# Patient Record
Sex: Female | Born: 1980 | Race: Black or African American | Hispanic: No | Marital: Single | Smoking: Never smoker
Health system: Southern US, Community
[De-identification: ages and names within clinical notes are randomized; demographics above are authoritative.]

## PROBLEM LIST (undated history)

## (undated) DIAGNOSIS — J309 Allergic rhinitis, unspecified: Secondary | ICD-10-CM

## (undated) DIAGNOSIS — I10 Essential (primary) hypertension: Secondary | ICD-10-CM

## (undated) DIAGNOSIS — J4520 Mild intermittent asthma, uncomplicated: Secondary | ICD-10-CM

## (undated) DIAGNOSIS — E785 Hyperlipidemia, unspecified: Secondary | ICD-10-CM

## (undated) DIAGNOSIS — E119 Type 2 diabetes mellitus without complications: Secondary | ICD-10-CM

## (undated) DIAGNOSIS — G4733 Obstructive sleep apnea (adult) (pediatric): Secondary | ICD-10-CM

## (undated) DIAGNOSIS — E559 Vitamin D deficiency, unspecified: Secondary | ICD-10-CM

## (undated) DIAGNOSIS — Z9989 Dependence on other enabling machines and devices: Secondary | ICD-10-CM

## (undated) DIAGNOSIS — M1990 Unspecified osteoarthritis, unspecified site: Secondary | ICD-10-CM

## (undated) DIAGNOSIS — D509 Iron deficiency anemia, unspecified: Secondary | ICD-10-CM

## (undated) HISTORY — DX: Vitamin D deficiency, unspecified: E55.9

## (undated) HISTORY — DX: Morbid (severe) obesity due to excess calories: E66.01

## (undated) HISTORY — DX: Iron deficiency anemia, unspecified: D50.9

## (undated) HISTORY — DX: Mild intermittent asthma, uncomplicated: J45.20

## (undated) HISTORY — DX: Allergic rhinitis, unspecified: J30.9

## (undated) HISTORY — DX: Hyperlipidemia, unspecified: E78.5

## (undated) HISTORY — PX: INTRAUTERINE DEVICE INSERTION: SHX323

---

## 2002-05-31 ENCOUNTER — Ambulatory Visit (HOSPITAL_COMMUNITY): Admission: RE | Admit: 2002-05-31 | Discharge: 2002-05-31 | Payer: Self-pay | Admitting: *Deleted

## 2002-07-10 ENCOUNTER — Ambulatory Visit (HOSPITAL_COMMUNITY): Admission: RE | Admit: 2002-07-10 | Discharge: 2002-07-10 | Payer: Self-pay | Admitting: *Deleted

## 2002-07-12 ENCOUNTER — Encounter: Admission: RE | Admit: 2002-07-12 | Discharge: 2002-07-12 | Payer: Self-pay | Admitting: *Deleted

## 2002-07-18 ENCOUNTER — Encounter: Admission: RE | Admit: 2002-07-18 | Discharge: 2002-07-18 | Payer: Self-pay | Admitting: *Deleted

## 2002-08-01 ENCOUNTER — Encounter: Admission: RE | Admit: 2002-08-01 | Discharge: 2002-08-01 | Payer: Self-pay | Admitting: *Deleted

## 2002-08-03 ENCOUNTER — Inpatient Hospital Stay (HOSPITAL_COMMUNITY): Admission: RE | Admit: 2002-08-03 | Discharge: 2002-08-03 | Payer: Self-pay | Admitting: *Deleted

## 2002-08-08 ENCOUNTER — Encounter: Admission: RE | Admit: 2002-08-08 | Discharge: 2002-08-08 | Payer: Self-pay | Admitting: *Deleted

## 2002-08-15 ENCOUNTER — Encounter: Admission: RE | Admit: 2002-08-15 | Discharge: 2002-08-15 | Payer: Self-pay | Admitting: *Deleted

## 2002-08-22 ENCOUNTER — Encounter: Admission: RE | Admit: 2002-08-22 | Discharge: 2002-08-22 | Payer: Self-pay | Admitting: *Deleted

## 2002-08-22 ENCOUNTER — Encounter (HOSPITAL_COMMUNITY): Admission: RE | Admit: 2002-08-22 | Discharge: 2002-09-07 | Payer: Self-pay | Admitting: *Deleted

## 2002-08-29 ENCOUNTER — Encounter: Admission: RE | Admit: 2002-08-29 | Discharge: 2002-08-29 | Payer: Self-pay | Admitting: *Deleted

## 2002-09-05 ENCOUNTER — Encounter: Admission: RE | Admit: 2002-09-05 | Discharge: 2002-09-05 | Payer: Self-pay | Admitting: *Deleted

## 2002-09-07 ENCOUNTER — Inpatient Hospital Stay (HOSPITAL_COMMUNITY): Admission: AD | Admit: 2002-09-07 | Discharge: 2002-09-11 | Payer: Self-pay | Admitting: *Deleted

## 2002-09-08 ENCOUNTER — Encounter (INDEPENDENT_AMBULATORY_CARE_PROVIDER_SITE_OTHER): Payer: Self-pay

## 2002-09-14 ENCOUNTER — Inpatient Hospital Stay (HOSPITAL_COMMUNITY): Admission: AD | Admit: 2002-09-14 | Discharge: 2002-09-14 | Payer: Self-pay | Admitting: Obstetrics and Gynecology

## 2002-09-16 ENCOUNTER — Inpatient Hospital Stay (HOSPITAL_COMMUNITY): Admission: AD | Admit: 2002-09-16 | Discharge: 2002-09-16 | Payer: Self-pay | Admitting: Obstetrics and Gynecology

## 2002-09-18 ENCOUNTER — Inpatient Hospital Stay (HOSPITAL_COMMUNITY): Admission: AD | Admit: 2002-09-18 | Discharge: 2002-09-18 | Payer: Self-pay | Admitting: Obstetrics and Gynecology

## 2002-09-21 ENCOUNTER — Inpatient Hospital Stay (HOSPITAL_COMMUNITY): Admission: AD | Admit: 2002-09-21 | Discharge: 2002-09-21 | Payer: Self-pay | Admitting: Obstetrics and Gynecology

## 2002-09-24 ENCOUNTER — Inpatient Hospital Stay (HOSPITAL_COMMUNITY): Admission: AD | Admit: 2002-09-24 | Discharge: 2002-09-24 | Payer: Self-pay | Admitting: Obstetrics and Gynecology

## 2002-10-01 ENCOUNTER — Inpatient Hospital Stay (HOSPITAL_COMMUNITY): Admission: AD | Admit: 2002-10-01 | Discharge: 2002-10-01 | Payer: Self-pay | Admitting: Family Medicine

## 2002-10-08 ENCOUNTER — Inpatient Hospital Stay (HOSPITAL_COMMUNITY): Admission: AD | Admit: 2002-10-08 | Discharge: 2002-10-08 | Payer: Self-pay | Admitting: Obstetrics and Gynecology

## 2002-10-15 ENCOUNTER — Inpatient Hospital Stay (HOSPITAL_COMMUNITY): Admission: AD | Admit: 2002-10-15 | Discharge: 2002-10-15 | Payer: Self-pay | Admitting: Obstetrics and Gynecology

## 2002-10-18 ENCOUNTER — Encounter: Admission: RE | Admit: 2002-10-18 | Discharge: 2002-10-18 | Payer: Self-pay | Admitting: Obstetrics and Gynecology

## 2003-04-30 ENCOUNTER — Encounter: Admission: RE | Admit: 2003-04-30 | Discharge: 2003-07-29 | Payer: Self-pay | Admitting: Obstetrics and Gynecology

## 2003-08-22 ENCOUNTER — Encounter: Admission: RE | Admit: 2003-08-22 | Discharge: 2003-08-22 | Payer: Self-pay | Admitting: Obstetrics and Gynecology

## 2003-10-17 ENCOUNTER — Encounter: Admission: RE | Admit: 2003-10-17 | Discharge: 2003-10-17 | Payer: Self-pay | Admitting: Obstetrics and Gynecology

## 2003-11-01 ENCOUNTER — Ambulatory Visit (HOSPITAL_COMMUNITY): Admission: RE | Admit: 2003-11-01 | Discharge: 2003-11-01 | Payer: Self-pay | Admitting: Obstetrics and Gynecology

## 2003-12-05 ENCOUNTER — Encounter: Admission: RE | Admit: 2003-12-05 | Discharge: 2003-12-05 | Payer: Self-pay | Admitting: Family Medicine

## 2004-01-23 ENCOUNTER — Ambulatory Visit (HOSPITAL_COMMUNITY): Admission: RE | Admit: 2004-01-23 | Discharge: 2004-01-23 | Payer: Self-pay | Admitting: Family Medicine

## 2004-01-30 ENCOUNTER — Encounter: Admission: RE | Admit: 2004-01-30 | Discharge: 2004-01-30 | Payer: Self-pay | Admitting: Obstetrics and Gynecology

## 2004-03-28 ENCOUNTER — Emergency Department (HOSPITAL_COMMUNITY): Admission: EM | Admit: 2004-03-28 | Discharge: 2004-03-28 | Payer: Self-pay | Admitting: Emergency Medicine

## 2004-04-09 ENCOUNTER — Emergency Department (HOSPITAL_COMMUNITY): Admission: EM | Admit: 2004-04-09 | Discharge: 2004-04-09 | Payer: Self-pay | Admitting: Emergency Medicine

## 2004-06-03 ENCOUNTER — Encounter: Admission: RE | Admit: 2004-06-03 | Discharge: 2004-06-03 | Payer: Self-pay | Admitting: Family Medicine

## 2004-12-26 ENCOUNTER — Emergency Department (HOSPITAL_COMMUNITY): Admission: EM | Admit: 2004-12-26 | Discharge: 2004-12-26 | Payer: Self-pay | Admitting: Emergency Medicine

## 2005-11-21 ENCOUNTER — Emergency Department (HOSPITAL_COMMUNITY): Admission: EM | Admit: 2005-11-21 | Discharge: 2005-11-22 | Payer: Self-pay | Admitting: Emergency Medicine

## 2005-12-07 ENCOUNTER — Emergency Department (HOSPITAL_COMMUNITY): Admission: EM | Admit: 2005-12-07 | Discharge: 2005-12-07 | Payer: Self-pay | Admitting: Emergency Medicine

## 2006-01-09 ENCOUNTER — Emergency Department (HOSPITAL_COMMUNITY): Admission: EM | Admit: 2006-01-09 | Discharge: 2006-01-10 | Payer: Self-pay | Admitting: Emergency Medicine

## 2006-09-26 ENCOUNTER — Emergency Department (HOSPITAL_COMMUNITY): Admission: EM | Admit: 2006-09-26 | Discharge: 2006-09-26 | Payer: Self-pay | Admitting: Emergency Medicine

## 2006-12-26 ENCOUNTER — Emergency Department (HOSPITAL_COMMUNITY): Admission: EM | Admit: 2006-12-26 | Discharge: 2006-12-26 | Payer: Self-pay | Admitting: Emergency Medicine

## 2007-05-08 ENCOUNTER — Emergency Department (HOSPITAL_COMMUNITY): Admission: EM | Admit: 2007-05-08 | Discharge: 2007-05-08 | Payer: Self-pay | Admitting: Emergency Medicine

## 2007-08-04 ENCOUNTER — Emergency Department (HOSPITAL_COMMUNITY): Admission: EM | Admit: 2007-08-04 | Discharge: 2007-08-04 | Payer: Self-pay | Admitting: Emergency Medicine

## 2007-08-25 ENCOUNTER — Emergency Department (HOSPITAL_COMMUNITY): Admission: EM | Admit: 2007-08-25 | Discharge: 2007-08-25 | Payer: Self-pay | Admitting: Emergency Medicine

## 2009-09-13 ENCOUNTER — Emergency Department (HOSPITAL_COMMUNITY): Admission: EM | Admit: 2009-09-13 | Discharge: 2009-09-13 | Payer: Self-pay | Admitting: Emergency Medicine

## 2010-12-18 NOTE — Group Therapy Note (Signed)
NAMECAOILAINN, SACKS                        ACCOUNT NO.:  [id number]   MEDICAL RECORD NO.:  [id number]                   PATIENT TYPE:  OUT   LOCATION:  [facility]                           FACILITY:  [facility]   PHYSICIAN:  Tinnie Gens, MD                     DATE OF BIRTH:  08-May-1981   DATE OF SERVICE:  08/22/2003                                    CLINIC NOTE   CHIEF COMPLAINT:  Enlarged uterus.   HISTORY OF PRESENT ILLNESS:  The patient is a 30 year old gravida 1, para 1  who is referred from the [facility].  Apparently, she was seen  there in January where it was noted that she possibly had an enlarged  uterus, so she was sent here for further evaluation.  The patient does  report she has had no change in her periods.  She is on Nordette, and her  periods are normal and not heavy.   PHYSICAL EXAMINATION:  VITAL SIGNS:  On exam today, her blood pressure is  135/89, weight is 289.6.  GENERAL:  She is a morbidly obese female in no acute distress.  GENITOURINARY:  Normal external female genitalia with skin tags noted on the  inner thighs.  PELVIC:  The cervix feels small.  There is no mass appreciated.  There is no  cervical motion tenderness.  The uterus does feel somewhat enlarged  approximately 10 to 12 weeks size although exam is significantly limited by  body habitus.  The adnexa could not be appreciated at all.   IMPRESSION:  Question of enlarged uterus.   PLAN:  Will schedule the patient for a pelvic ultrasound to evaluate both  uterus and adnexa which she will follow up in 6 weeks.                                               Tinnie Gens, MD    TP/MEDQ  D:  08/22/2003  T:  08/23/2003  Job:  [id number]

## 2010-12-18 NOTE — Discharge Summary (Signed)
   Suzanne Lewis, Suzanne Lewis                        ACCOUNT NO.:  [id number]   MEDICAL RECORD NO.:  [id number]                   PATIENT TYPE:  REC   LOCATION:  [facility]                                  [facility]   PHYSICIAN:  Nani Gasser, M.D.            DATE OF BIRTH:  January 17, 1981   DATE OF ADMISSION:  08/22/2002  DATE OF DISCHARGE:  09/07/2002                                 DISCHARGE SUMMARY   DISCHARGE DIAGNOSES:  1. Delivery of a term infant.  2. Low transverse cesarean section for arrest of dilatation.  3. Anemia, acute blood loss secondary to delivery.   DISCHARGE MEDICATIONS:  1. Percocet 5/325 mg one to two q.4-6h. p.r.n.  2. Ibuprofen 200 mg one p.o. q.6h. p.r.n.  3. Ortho Evra patch applied weekly.  4. Iron 325 mg one p.o. daily.   DIET:  Regular.   ACTIVITY:  No heavy lifting, sexual activity, and nothing in the vagina for  six weeks.   FOLLOW-UP:  The patient is to follow up at [facility]: [name] in six weeks.   DISPOSITION:  Discharged to home.   HOSPITAL COURSE:  The patient is a 30 year old, G1, P0, at 41-1/7 weeks, who  presented with oligohydramnios noted on ultrasound with an AFI of 3.8.  She  also was known to have late prenatal care and occasional increased elevated  blood pressures over the weeks prior to admission.  Her membranes were  artificially ruptured and a PIH panel was obtained.  Pitocin was also  started.  The patient continued to labor, but had arrest of dilatation and  thus was taken to the OR for a primary low transverse C-section.  She was  dilated at 5 cm, 90% effaced, and -1 station at that time.  The patient did  well in the OR and delivered a female baby, 6 pounds 9 ounces, 18-1/2 inches  long with Apgars of 8 at one minute and 9 at five minutes.  The patient has  decided to bottle feed.  After delivery, the patient had no symptoms of PIH.  Labs showed a white count of 11.3, hemoglobin 9.6, hematocrit 28.6, and  platelets 284.  The  creatinine was 0.8, uric acid 6.1.  LDH 148, BUN 7, SGOT  29, and SGPT less than 19.  The patient initially had poor urine output, but  after delivery output increased significantly.  The patient maintained  having normal blood pressures in the 120s/60s.                                               Nani Gasser, M.D.    CM/MEDQ  D:  09/11/2002  T:  09/11/2002  Job:  [id number]

## 2010-12-18 NOTE — Op Note (Signed)
Suzanne Lewis, Suzanne Lewis                        ACCOUNT NO.:  [id number]   MEDICAL RECORD NO.:  [id number]                   PATIENT TYPE:  INP   LOCATION:  9117                                 [facility]   PHYSICIAN:  Tanya S. Shawnie Pons, M.D.                DATE OF BIRTH:  04/03/1981   DATE OF PROCEDURE:  09/08/2002  DATE OF DISCHARGE:                                 OPERATIVE REPORT   PREOPERATIVE DIAGNOSES:  1. Intrauterine pregnancy.  2. Arrested dilation.  3. Chronic hypertension.   POSTOPERATIVE DIAGNOSES:  1. Intrauterine pregnancy.  2. Arrested dilation.  3. Chronic hypertension.   PROCEDURE:  Primary low-transverse cesarean section.   SURGEON:  Shelbie Proctor. Shawnie Pons, M.D.   ASSISTANT:  Maurice March, M.D.   ANESTHESIA:  Epidural.   FINDINGS:  Viable female infant; Apgars 8/9.   ESTIMATED BLOOD LOSS:  Less than 1000 mL.   COMPLICATIONS:  None.   SPECIMENS:  Placenta to pathology, cord blood.   INDICATIONS FOR PROCEDURE:  The patient is a 30 year old, gravida 1, para 0.  who is __________ dated by 27 week ultrasound, whose dating today is 41-1/7  weeks.  She [facility].  Today on EPT, her score was 10/10, but she had an AFI of 3.7; so it was felt  induction for oligohydramnios was indicated.   Upon arrival to the floor, she was started on low-dose Pitocin.  The cervix  changed fairly quickly from 1 to 4 cm and rupture of membranes was achieved.  IUPC and FSE were placed.  Then, despite adequate contractions for the next  6-7 hours the patient failed to dilate past 5 cm.  The decision was then  made to proceed with operative delivery.   DESCRIPTION OF PROCEDURE:  The patient was taken to the OR.  She was prepped  and draped in the usual sterile fashion.  Epidural analgesia was found to be  adequate.  A Pfannenstiel incision was made above the pannus in transverse  fashion with a knife.  This was carried down to the  underlying fascia which  was incised sharply.  The fascia was then elevated off the anterior and  superior aspects of the rectus muscle bluntly laterally and then sharply in  the midline.  The peritoneal cavity was then entered bluntly and attempt was  made to extend the incision by two surgeons pulling on the peritoneal  opening.  However, there was very little room achieved by this.   The peritoneum was then opened inferiorly and superiorly sharply, as well as  the rectus muscle was divided about one-quarter of the way on each side to  allow more room to deliver the baby.  The bladder blade was then placed  inside the incision and a large rigid retractor used.  The knife was used to  make a low-transverse incision on the uterus.  It was carried down to the  underlying amniotic fluid cavity in the midline and then the incision was  extended out laterally bluntly.  The infant's head was found to be  disengaged in the pelvis with the exception of some molding.  The head was  brought out through the incision.  An attempt was made to deliver the head.  After several attempts the head was not coming easily, so a vacuum was  applied and the infant delivered atraumatically.   There was a nuchal cord x1 reduced and the rest of the body delivered  without incident.  The cord was clamped x2 and cut.  There was spontaneous  crying.  The infant was handed to the awaiting pediatricians, where Apgars  were found to be 8/9.  The placenta was then delivered manually and Kelly  clamps used to remove the trailing membranes.  A dry lap pad was then used  to clean the uterus.  A ring forceps used to grasp the edges of the uterine  incision in the lower segment; however, due to the deepness of the uterus  inside the incision at the level of omentum and preperitoneal fat that was  intruding into the line of sight, it was decided that the uterus should be  delivered through the incision and closed.   When  this was done, the uterus was closed with first a #1 Vicryl suture in a  locked, running fashion.  A second imbricating layer done in a Lembert  fashion with #1 Vicryl suture.  Good hemostasis was obtained at that point,  and then the gutters were irrigated.  The patient was found to have normal  tubes and ovaries on both sides.  The incision was inspected again and found  to be hemostatic.  The uterus was returned to the abdominal cavity.  The  incision was again inspected after all clots had been removed from the  abdominal cavity; it was found to be hemostatic.   The rectus muscle was then put together with one figure-of-eight on each  side to close the defect made by the initial incision.  The fascia was  closed with #1 Vicryl suture in a running fashion.  Subcuticular tissue was  copiously irrigated and Bovie cautery was used to achieve excellent  hemostasis.  The skin was closed using clips.  All instrument, needle and  wipe counts were correct x2.   The patient was awakened and taken to recovery room in stable condition.                                               Shelbie Proctor. Shawnie Pons, M.D.    TSP/MEDQ  D:  09/08/2002  T:  09/08/2002  Job:  [id number]

## 2011-12-15 ENCOUNTER — Emergency Department (HOSPITAL_COMMUNITY): Payer: No Typology Code available for payment source

## 2011-12-15 ENCOUNTER — Encounter (HOSPITAL_COMMUNITY): Payer: Self-pay | Admitting: *Deleted

## 2011-12-15 ENCOUNTER — Emergency Department (HOSPITAL_COMMUNITY)
Admission: EM | Admit: 2011-12-15 | Discharge: 2011-12-15 | Disposition: A | Discharge status: Home / Self Care | Payer: No Typology Code available for payment source | Attending: Emergency Medicine | Admitting: Emergency Medicine

## 2011-12-15 DIAGNOSIS — R0982 Postnasal drip: Secondary | ICD-10-CM | POA: Insufficient documentation

## 2011-12-15 DIAGNOSIS — J3489 Other specified disorders of nose and nasal sinuses: Secondary | ICD-10-CM | POA: Insufficient documentation

## 2011-12-15 DIAGNOSIS — R05 Cough: Secondary | ICD-10-CM | POA: Insufficient documentation

## 2011-12-15 DIAGNOSIS — R000 Tachycardia, unspecified: Secondary | ICD-10-CM | POA: Insufficient documentation

## 2011-12-15 DIAGNOSIS — R059 Cough, unspecified: Secondary | ICD-10-CM | POA: Insufficient documentation

## 2011-12-15 DIAGNOSIS — R6889 Other general symptoms and signs: Secondary | ICD-10-CM | POA: Insufficient documentation

## 2011-12-15 DIAGNOSIS — J069 Acute upper respiratory infection, unspecified: Secondary | ICD-10-CM | POA: Insufficient documentation

## 2011-12-15 DIAGNOSIS — H9209 Otalgia, unspecified ear: Secondary | ICD-10-CM | POA: Insufficient documentation

## 2011-12-15 DIAGNOSIS — R062 Wheezing: Secondary | ICD-10-CM | POA: Insufficient documentation

## 2011-12-15 MED ORDER — ALBUTEROL SULFATE HFA 108 (90 BASE) MCG/ACT IN AERS
2.0000 | INHALATION_SPRAY | RESPIRATORY_TRACT | Status: DC | PRN
  Administered 2011-12-15: 2 via RESPIRATORY_TRACT
  Filled 2011-12-15: qty 6.7

## 2011-12-15 MED ORDER — ALBUTEROL SULFATE (5 MG/ML) 0.5% IN NEBU
5.0000 mg | INHALATION_SOLUTION | Freq: Once | RESPIRATORY_TRACT | Status: AC
  Administered 2011-12-15: 5 mg via RESPIRATORY_TRACT
  Filled 2011-12-15: qty 1

## 2011-12-15 MED ORDER — IBUPROFEN 800 MG PO TABS
800.0000 mg | ORAL_TABLET | Freq: Once | ORAL | Status: AC
  Administered 2011-12-15: 800 mg via ORAL
  Filled 2011-12-15: qty 1

## 2011-12-15 NOTE — ED Notes (Signed)
Cold cough since last pm she has come chest congestion and pain when she breathes in

## 2011-12-15 NOTE — Discharge Instructions (Signed)
Ms Piechocki the chest x-ray today showed no pneumonia. He did have a little wheezing on exam and we treated that with her breathing treatment. Continue the Mucinex. Take ibuprofen every 6 hours as needed for body aches and pains. Followup with [facility] as needed.

## 2011-12-15 NOTE — ED Provider Notes (Signed)
History     CSN: [id number]  Arrival date & time 12/15/11  1756   First MD Initiated Contact with Patient 12/15/11 2058      Chief Complaint  Patient presents with  . Cough    (Consider location/radiation/quality/duration/timing/severity/associated sxs/prior treatment) Patient is a 31 y.o. female presenting with cough. The history is provided by the patient and a friend. No language interpreter was used.  Cough This is a new problem. The current episode started 2 days ago. The problem occurs hourly. The problem has been gradually worsening. Associated symptoms include ear pain, rhinorrhea and wheezing. Pertinent negatives include no chills and no shortness of breath.   Complaining of a cough x2 days especially when she's lying down. States that her right ear was hurting the other day but is better today. She took the Mucinex this morning with some relief. States that her throat is also a tickling and she's been sneezing. Denies fever, chest pain or shortness of breath.  History reviewed. No pertinent past medical history.  History reviewed. No pertinent past surgical history.  No family history on file.  History  Substance Use Topics  . Smoking status: Never Smoker   . Smokeless tobacco: Not on file  . Alcohol Use: No    OB History    Grav Para Term Preterm Abortions TAB SAB Ect Mult Living                  Review of Systems  Constitutional: Negative.  Negative for fever and chills.  HENT: Positive for ear pain, congestion, rhinorrhea, sneezing and postnasal drip.   Eyes: Negative.   Respiratory: Positive for cough and wheezing. Negative for chest tightness and shortness of breath.   Cardiovascular: Negative.   Gastrointestinal: Positive for vomiting. Negative for nausea.  Neurological: Negative.  Negative for dizziness, weakness and light-headedness.  Psychiatric/Behavioral: Negative.   All other systems reviewed and are negative.    Allergies  Review of  patient's allergies indicates no known allergies.  Home Medications  No current outpatient prescriptions on file.  BP 120/81  Pulse 106  Temp(Src) 98.4 F (36.9 C) (Oral)  Resp 22  Ht 4\' 11"  (1.499 m)  Wt 220 lb (99.791 kg)  BMI 44.43 kg/m2  SpO2 98%  LMP 12/02/2011  Physical Exam  Nursing note and vitals reviewed. Constitutional: She is oriented to person, place, and time. She appears well-developed and well-nourished.  HENT:  Head: Normocephalic.  Eyes: Conjunctivae and EOM are normal. Pupils are equal, round, and reactive to light.  Neck: Normal range of motion. Neck supple.  Cardiovascular:       tach  Pulmonary/Chest: Effort normal. No respiratory distress. She has wheezes. She has no rales. She exhibits no tenderness.  Abdominal: Soft.  Musculoskeletal: Normal range of motion.  Neurological: She is alert and oriented to person, place, and time.  Skin: Skin is warm and dry.  Psychiatric: She has a normal mood and affect.    ED Course  Procedures (including critical care time)  Labs Reviewed - No data to display Dg Chest 2 View  12/15/2011  *RADIOLOGY REPORT*  Clinical Data: Cough and shortness of breath.  CHEST - 2 VIEW  Comparison: 09/13/2009.  Findings: Trachea is midline.  Heart size normal. Lungs are clear. No pleural fluid.  IMPRESSION: No acute findings.  Original Report Authenticated By: Reyes Ivan, M.D.     No diagnosis found.    MDM  Upper respiratory symptoms.  Better after breathing treatment. Continue  mucinex and follow up with  [facility] if not better.         Remi Haggard, NP 12/16/11 [phone]

## 2011-12-20 NOTE — ED Provider Notes (Signed)
Medical screening examination/treatment/procedure(s) were performed by non-physician practitioner and as supervising physician I was immediately available for consultation/collaboration.  Renita Brocks R. Latravia Southgate, MD 12/20/11 1606 

## 2013-10-15 ENCOUNTER — Encounter (HOSPITAL_COMMUNITY): Payer: Self-pay | Admitting: Emergency Medicine

## 2013-10-15 ENCOUNTER — Emergency Department (HOSPITAL_COMMUNITY): Payer: Medicare HMO

## 2013-10-15 DIAGNOSIS — J40 Bronchitis, not specified as acute or chronic: Secondary | ICD-10-CM | POA: Diagnosis not present

## 2013-10-15 DIAGNOSIS — R0602 Shortness of breath: Secondary | ICD-10-CM | POA: Diagnosis present

## 2013-10-15 DIAGNOSIS — J029 Acute pharyngitis, unspecified: Secondary | ICD-10-CM | POA: Diagnosis not present

## 2013-10-15 LAB — CBC WITH DIFFERENTIAL/PLATELET
Basophils Absolute: 0 10*3/uL (ref 0.0–0.1)
Basophils Relative: 1 % (ref 0–1)
EOS PCT: 1 % (ref 0–5)
Eosinophils Absolute: 0.1 10*3/uL (ref 0.0–0.7)
HEMATOCRIT: 30.6 % — AB (ref 36.0–46.0)
Hemoglobin: 9.6 g/dL — ABNORMAL LOW (ref 12.0–15.0)
LYMPHS ABS: 2 10*3/uL (ref 0.7–4.0)
Lymphocytes Relative: 52 % — ABNORMAL HIGH (ref 12–46)
MCH: 22.9 pg — ABNORMAL LOW (ref 26.0–34.0)
MCHC: 31.4 g/dL (ref 30.0–36.0)
MCV: 73 fL — AB (ref 78.0–100.0)
MONO ABS: 0.3 10*3/uL (ref 0.1–1.0)
Monocytes Relative: 7 % (ref 3–12)
NEUTROS ABS: 1.5 10*3/uL — AB (ref 1.7–7.7)
Neutrophils Relative %: 39 % — ABNORMAL LOW (ref 43–77)
Platelets: 479 10*3/uL — ABNORMAL HIGH (ref 150–400)
RBC: 4.19 MIL/uL (ref 3.87–5.11)
RDW: 16.9 % — ABNORMAL HIGH (ref 11.5–15.5)
WBC: 3.9 10*3/uL — AB (ref 4.0–10.5)

## 2013-10-15 LAB — I-STAT CHEM 8, ED
BUN: 4 mg/dL — ABNORMAL LOW (ref 6–23)
CREATININE: 0.7 mg/dL (ref 0.50–1.10)
Calcium, Ion: 1.04 mmol/L — ABNORMAL LOW (ref 1.12–1.23)
Chloride: 103 mEq/L (ref 96–112)
GLUCOSE: 92 mg/dL (ref 70–99)
HEMATOCRIT: 34 % — AB (ref 36.0–46.0)
HEMOGLOBIN: 11.6 g/dL — AB (ref 12.0–15.0)
POTASSIUM: 3.3 meq/L — AB (ref 3.7–5.3)
SODIUM: 140 meq/L (ref 137–147)
TCO2: 21 mmol/L (ref 0–100)

## 2013-10-15 NOTE — ED Notes (Signed)
Patient with two week history of cold symptoms, getting worse.  Patient states she is having some shortness of breath with the cold symptoms.

## 2013-10-16 ENCOUNTER — Emergency Department (HOSPITAL_COMMUNITY)
Admission: EM | Admit: 2013-10-16 | Discharge: 2013-10-16 | Disposition: A | Discharge status: Home / Self Care | Payer: Medicare HMO | Attending: Emergency Medicine | Admitting: Emergency Medicine

## 2013-10-16 DIAGNOSIS — J209 Acute bronchitis, unspecified: Secondary | ICD-10-CM

## 2013-10-16 DIAGNOSIS — J40 Bronchitis, not specified as acute or chronic: Secondary | ICD-10-CM | POA: Diagnosis not present

## 2013-10-16 MED ORDER — PREDNISONE 50 MG PO TABS
50.0000 mg | ORAL_TABLET | Freq: Every day | ORAL | Status: DC
Start: 2013-10-16 — End: 2014-01-17

## 2013-10-16 MED ORDER — ALBUTEROL SULFATE HFA 108 (90 BASE) MCG/ACT IN AERS
2.0000 | INHALATION_SPRAY | Freq: Once | RESPIRATORY_TRACT | Status: AC
  Administered 2013-10-16: 2 via RESPIRATORY_TRACT
  Filled 2013-10-16: qty 6.7

## 2013-10-16 MED ORDER — BENZONATATE 100 MG PO CAPS: 100.0000 mg | ORAL_CAPSULE | Freq: Three times a day (TID) | ORAL | Status: DC

## 2013-10-16 MED ORDER — PREDNISONE 20 MG PO TABS
60.0000 mg | ORAL_TABLET | Freq: Once | ORAL | Status: AC
  Administered 2013-10-16: 60 mg via ORAL
  Filled 2013-10-16: qty 3

## 2013-10-16 NOTE — Discharge Instructions (Signed)
Upper Respiratory Infection, Adult An upper respiratory infection (URI) is also known as the common cold. It is often caused by a type of germ (virus). Colds are easily spread (contagious). You can pass it to others by kissing, coughing, sneezing, or drinking out of the same glass. Usually, you get better in 1 or 2 weeks.  HOME CARE   Only take medicine as told by your doctor.  Use a warm mist humidifier or breathe in steam from a hot shower.  Drink enough water and fluids to keep your pee (urine) clear or pale yellow.  Get plenty of rest.  Return to work when your temperature is back to normal or as told by your doctor. You may use a face mask and wash your hands to stop your cold from spreading. GET HELP RIGHT AWAY IF:   After the first few days, you feel you are getting worse.  You have questions about your medicine.  You have chills, shortness of breath, or brown or red spit (mucus).  You have yellow or brown snot (nasal discharge) or pain in the face, especially when you bend forward.  You have a fever, puffy (swollen) neck, pain when you swallow, or white spots in the back of your throat.  You have a bad headache, ear pain, sinus pain, or chest pain.  You have a high-pitched whistling sound when you breathe in and out (wheezing).  You have a lasting cough or cough up blood.  You have sore muscles or a stiff neck. MAKE SURE YOU:   Understand these instructions.  Will watch your condition.  Will get help right away if you are not doing well or get worse. Document Released: 01/05/2008 Document Revised: 10/11/2011 Document Reviewed: 11/23/2010 [facility] Patient Information 2014 [place], [place]. Bronchospasm, Adult A bronchospasm is a spasm or tightening of the airways going into the lungs. During a bronchospasm breathing becomes more difficult because the airways get smaller. When this happens there can be coughing, a whistling sound when breathing (wheezing), and  difficulty breathing. Bronchospasm is often associated with asthma, but not all patients who experience a bronchospasm have asthma. CAUSES  A bronchospasm is caused by inflammation or irritation of the airways. The inflammation or irritation may be triggered by:   Allergies (such as to animals, pollen, food, or mold). Allergens that cause bronchospasm may cause wheezing immediately after exposure or many hours later.   Infection. Viral infections are believed to be the most common cause of bronchospasm.   Exercise.   Irritants (such as pollution, cigarette smoke, strong odors, aerosol sprays, and paint fumes).   Weather changes. Winds increase molds and pollens in the air. Rain refreshes the air by washing irritants out. Cold air may cause inflammation.   Stress and emotional upset.  SIGNS AND SYMPTOMS   Wheezing.   Excessive nighttime coughing.   Frequent or severe coughing with a simple cold.   Chest tightness.   Shortness of breath.  DIAGNOSIS  Bronchospasm is usually diagnosed through a history and physical exam. Tests, such as chest X-rays, are sometimes done to look for other conditions. TREATMENT   Inhaled medicines can be given to open up your airways and help you breathe. The medicines can be given using either an inhaler or a nebulizer machine.  Corticosteroid medicines may be given for severe bronchospasm, usually when it is associated with asthma. HOME CARE INSTRUCTIONS   Always have a plan prepared for seeking medical care. Know when to call your health care provider  and local emergency services (911 in the [place] Know where you can access local emergency care.  Only take medicines as directed by your health care provider.  If you were prescribed an inhaler or nebulizer machine, ask your health care provider to explain how to use it correctly. Always use a spacer with your inhaler if you were given one.  It is necessary to remain calm during an attack.  Try to relax and breathe more slowly.  Control your home environment in the following ways:   Change your heating and air conditioning filter at least once a month.   Limit your use of fireplaces and wood stoves.  Do not smoke and do not allow smoking in your home.   Avoid exposure to perfumes and fragrances.   Get rid of pests (such as roaches and mice) and their droppings.   Throw away plants if you see mold on them.   Keep your house clean and dust free.   Replace carpet with wood, tile, or vinyl flooring. Carpet can trap dander and dust.   Use allergy-proof pillows, mattress covers, and box spring covers.   Wash bed sheets and blankets every week in hot water and dry them in a dryer.   Use blankets that are made of polyester or cotton.   Wash hands frequently. SEEK MEDICAL CARE IF:   You have muscle aches.   You have chest pain.   The sputum changes from clear or white to yellow, green, gray, or bloody.   The sputum you cough up gets thicker.   There are problems that may be related to the medicine you are given, such as a rash, itching, swelling, or trouble breathing.  SEEK IMMEDIATE MEDICAL CARE IF:   You have worsening wheezing and coughing even after taking your prescribed medicines.   You have increased difficulty breathing.   You develop severe chest pain. MAKE SURE YOU:   Understand these instructions.  Will watch your condition.  Will get help right away if you are not doing well or get worse. Document Released: 07/22/2003 Document Revised: 03/21/2013 Document Reviewed: 01/08/2013 [facility] Patient Information 2014 [place].

## 2013-10-16 NOTE — ED Provider Notes (Signed)
CSN: [id number]     Arrival date & time 10/15/13  1924 History   First MD Initiated Contact with Patient 10/16/13 0403     Chief Complaint  Patient presents with  . URI  . Shortness of Breath     (Consider location/radiation/quality/duration/timing/severity/associated sxs/prior Treatment) HPI Patient presents with one and half weeks of nonproductive cough, shortness of breath and wheezing. She's had some sore throat with little nasal congestion. She denies any fevers or chills. She denies any lower sugary swelling or pain. She has no history of asthma or reactive airway disease. She denies any chest pain, abdominal pain, nausea or vomiting. History reviewed. No pertinent past medical history. History reviewed. No pertinent past surgical history. History reviewed. No pertinent family history. History  Substance Use Topics  . Smoking status: Never Smoker   . Smokeless tobacco: Not on file  . Alcohol Use: No   OB History   Grav Para Term Preterm Abortions TAB SAB Ect Mult Living                 Review of Systems  Constitutional: Negative for fever and chills.  HENT: Positive for sore throat. Negative for congestion and rhinorrhea.   Respiratory: Positive for cough, shortness of breath and wheezing.   Cardiovascular: Negative for chest pain, palpitations and leg swelling.  Gastrointestinal: Negative for nausea, vomiting, abdominal pain and diarrhea.  Genitourinary: Negative for dysuria.  Musculoskeletal: Negative for back pain, neck pain and neck stiffness.  Skin: Negative for rash and wound.  Neurological: Negative for dizziness, weakness, light-headedness, numbness and headaches.  All other systems reviewed and are negative.      Allergies  Review of patient's allergies indicates no known allergies.  Home Medications   Current Outpatient Rx  Name  Route  Sig  Dispense  Refill  . acetaminophen (TYLENOL) 500 MG tablet   Oral   Take 500 mg by mouth every 6 (six) hours  as needed for fever.          BP 113/68  Pulse 92  Temp(Src) 97.7 F (36.5 C) (Oral)  Resp 14  Ht 4\' 11"  (1.499 m)  Wt 321 lb 14.4 oz (146.013 kg)  BMI 64.98 kg/m2  SpO2 98%  LMP 10/12/2013 Physical Exam  Nursing note and vitals reviewed. Constitutional: She is oriented to person, place, and time. She appears well-developed and well-nourished. No distress.  HENT:  Head: Normocephalic and atraumatic.  Mouth/Throat: Oropharynx is clear and moist. No oropharyngeal exudate.  Eyes: EOM are normal. Pupils are equal, round, and reactive to light.  Neck: Normal range of motion. Neck supple.  Cardiovascular: Normal rate and regular rhythm.   Pulmonary/Chest: Effort normal. No respiratory distress. She has wheezes (mild diffuse end expiratory wheezing). She has no rales.  Abdominal: Soft. Bowel sounds are normal. She exhibits no distension and no mass. There is no tenderness. There is no rebound and no guarding.  Musculoskeletal: Normal range of motion. She exhibits no edema and no tenderness.  No calf swelling or tenderness.  Neurological: She is alert and oriented to person, place, and time.  Moves all extremities without deficit. Sensation is grossly intact.  Skin: Skin is warm and dry. No rash noted. No erythema.  Psychiatric: She has a normal mood and affect. Her behavior is normal.    ED Course  Procedures (including critical care time) Labs Review Labs Reviewed  CBC WITH DIFFERENTIAL - Abnormal; Notable for the following:    WBC 3.9 (*)  Hemoglobin 9.6 (*)    HCT 30.6 (*)    MCV 73.0 (*)    MCH 22.9 (*)    RDW 16.9 (*)    Platelets 479 (*)    Neutrophils Relative % 39 (*)    Neutro Abs 1.5 (*)    Lymphocytes Relative 52 (*)    All other components within normal limits  I-STAT CHEM 8, ED - Abnormal; Notable for the following:    Potassium 3.3 (*)    BUN 4 (*)    Calcium, Ion 1.04 (*)    Hemoglobin 11.6 (*)    HCT 34.0 (*)    All other components within normal  limits   Imaging Review Dg Chest 2 View  10/15/2013   CLINICAL DATA:  Shortness of breath, cough.  EXAM: CHEST  2 VIEW  COMPARISON:  Dec 15, 2011.  FINDINGS: The heart size and mediastinal contours are within normal limits. Both lungs are clear. No pleural effusion or pneumothorax is noted. The visualized skeletal structures are unremarkable.  IMPRESSION: No acute cardiopulmonary abnormality seen.   Electronically Signed   By: Sabino Dick M.D.   On: 10/15/2013 21:20     EKG Interpretation None      MDM   Final diagnoses:  None    Chest x-ray without any evidence of pneumonia. Other the patient is having bronchitis with bronchospastic symptoms. We'll give albuterol MDI treatment and dose of prednisone in the emergency department. Patient been advised to return immediately for worsening symptoms, shortness of breath, chest pain or fever. She has voiced understanding and agrees with plan.    Julianne Rice, MD 10/16/13 [phone]

## 2014-01-17 ENCOUNTER — Emergency Department (HOSPITAL_COMMUNITY): Payer: Medicare HMO

## 2014-01-17 ENCOUNTER — Emergency Department (HOSPITAL_COMMUNITY)
Admission: EM | Admit: 2014-01-17 | Discharge: 2014-01-17 | Disposition: A | Discharge status: Home / Self Care | Payer: Medicare HMO | Attending: Emergency Medicine | Admitting: Emergency Medicine

## 2014-01-17 ENCOUNTER — Encounter (HOSPITAL_COMMUNITY): Payer: Self-pay | Admitting: Emergency Medicine

## 2014-01-17 DIAGNOSIS — W010XXA Fall on same level from slipping, tripping and stumbling without subsequent striking against object, initial encounter: Secondary | ICD-10-CM | POA: Diagnosis not present

## 2014-01-17 DIAGNOSIS — Y929 Unspecified place or not applicable: Secondary | ICD-10-CM | POA: Diagnosis not present

## 2014-01-17 DIAGNOSIS — Y9389 Activity, other specified: Secondary | ICD-10-CM | POA: Diagnosis not present

## 2014-01-17 DIAGNOSIS — S93409A Sprain of unspecified ligament of unspecified ankle, initial encounter: Secondary | ICD-10-CM | POA: Diagnosis not present

## 2014-01-17 DIAGNOSIS — Z791 Long term (current) use of non-steroidal anti-inflammatories (NSAID): Secondary | ICD-10-CM | POA: Insufficient documentation

## 2014-01-17 DIAGNOSIS — S93402A Sprain of unspecified ligament of left ankle, initial encounter: Secondary | ICD-10-CM

## 2014-01-17 DIAGNOSIS — S8990XA Unspecified injury of unspecified lower leg, initial encounter: Secondary | ICD-10-CM | POA: Diagnosis present

## 2014-01-17 MED ORDER — MELOXICAM 7.5 MG PO TABS: 15.0000 mg | ORAL_TABLET | Freq: Every day | ORAL | Status: DC

## 2014-01-17 MED ORDER — TRAMADOL HCL 50 MG PO TABS
50.0000 mg | ORAL_TABLET | Freq: Four times a day (QID) | ORAL | Status: DC | PRN
Start: 2014-01-17 — End: 2014-04-04

## 2014-01-17 NOTE — ED Provider Notes (Signed)
CSN: [id number]     Arrival date & time 01/17/14  1246 History   First MD Initiated Contact with Patient 01/17/14 1350   This chart was scribed for Noland Fordyce PA-C, a non-physician practitioner working with No att. providers found by Denice Bors, ED Scribe. This patient was seen in room TR05C/TR05C and the patient's care was started at 2:45 PM       Chief Complaint  Patient presents with  . Ankle Pain     (Consider location/radiation/quality/duration/timing/severity/associated sxs/prior Treatment) No language interpreter was used.   HPI Comments: Suzanne Lewis is a 33 y.o. female who presents to the Emergency Department complaining of constant left ankle pain onset PTA. States she tripped over a pair of shoes-without fall--causing her to invert ankle. Describes pain as moderate in severity. Reports pain is exacerbated by touch, movement, and weight bearing. Denies trying any alleviating factors. Denies associated fever, and numbness.  History reviewed. No pertinent past medical history. History reviewed. No pertinent past surgical history. No family history on file. History  Substance Use Topics  . Smoking status: Never Smoker   . Smokeless tobacco: Not on file  . Alcohol Use: No   OB History   Grav Para Term Preterm Abortions TAB SAB Ect Mult Living                 Review of Systems  Constitutional: Negative for fever.  Musculoskeletal: Positive for arthralgias.  Neurological: Negative for numbness.      Allergies  Review of patient's allergies indicates no known allergies.  Home Medications   Prior to Admission medications   Medication Sig Start Date End Date Taking? Authorizing Provider  albuterol (PROVENTIL HFA;VENTOLIN HFA) 108 (90 BASE) MCG/ACT inhaler Inhale 2 puffs into the lungs every 6 (six) hours as needed for wheezing or shortness of breath.   Yes Historical Provider, MD  meloxicam (MOBIC) 7.5 MG tablet Take 2 tablets (15 mg total) by mouth  daily. 01/17/14   Noland Fordyce, PA-C  traMADol (ULTRAM) 50 MG tablet Take 1 tablet (50 mg total) by mouth every 6 (six) hours as needed. 01/17/14   Noland Fordyce, PA-C   BP 121/76  Pulse 83  Temp(Src) 99.1 F (37.3 C) (Oral)  Resp 18  Ht 4\' 11"  (1.499 m)  Wt 311 lb (141.069 kg)  BMI 62.78 kg/m2  SpO2 100%  LMP 12/27/2013 Physical Exam  Nursing note and vitals reviewed. Constitutional: She is oriented to person, place, and time. She appears well-developed and well-nourished.  Morbidly obese female sitting in wheel chair with left leg propped up. NAD  HENT:  Head: Normocephalic and atraumatic.  Eyes: EOM are normal.  Neck: Normal range of motion.  Cardiovascular: Normal rate.   Pulmonary/Chest: Effort normal.  Musculoskeletal: Normal range of motion.       Left ankle: Tenderness.  TTP to dorsum of left foot. Normal DP pulse. Normal sensation. Full ROM and 5/5 strength. No left calf pain, and no left knee pain.   Neurological: She is alert and oriented to person, place, and time.  Skin: Skin is warm and dry.  Psychiatric: She has a normal mood and affect. Her behavior is normal.    ED Course  Procedures (including critical care time) COORDINATION OF CARE:  Nursing notes reviewed. Vital signs reviewed. Initial pt interview and examination performed.   Filed Vitals:   01/17/14 1258 01/17/14 1529  BP: 124/81 121/76  Pulse: 87 83  Temp: 97.7 F (36.5 C) 99.1 F (37.3 C)  TempSrc: Oral Oral  Resp: 18 18  Height: 4\' 11"  (1.499 m)   Weight: 311 lb (141.069 kg)   SpO2: 99% 100%    4:47 PM-Discussed work up plan with pt at bedside, which includes  Orders Placed This Encounter  Procedures  . DG Ankle Complete Left    Standing Status: Standing     Number of Occurrences: 1     Standing Expiration Date:     Order Specific Question:  Reason for exam:    Answer:  ANKLE PAIN  . Apply ace wrap    Standing Status: Standing     Number of Occurrences: 1     Standing Expiration  Date:   . Pt agrees with plan.   Treatment plan initiated:Medications - No data to display   Initial diagnostic testing ordered.      Labs Review Labs Reviewed - No data to display  Imaging Review Dg Ankle Complete Left  01/17/2014   CLINICAL DATA:  Injured left ankle.  EXAM: LEFT ANKLE COMPLETE - 3+ VIEW  COMPARISON:  None.  FINDINGS: The ankle mortise is maintained. No acute ankle fracture or osteochondral lesion. Small rounded density near the medial malleolus is likely an unfused secondary ossification center. The visualized mid and hindfoot bony structures are intact.  IMPRESSION: No acute fracture.   Electronically Signed   By: Kalman Jewels M.D.   On: 01/17/2014 15:03   3:25 PM Nursing Notes Reviewed/ Care Coordinated Applicable Imaging Reviewed and incorporated into ED treatment Discussed results and treatment plan with pt. Pt demonstrates understanding and agrees with plan.    EKG Interpretation None      MDM   Final diagnoses:  Fall from slip, trip, or stumble  Left ankle sprain    Pt c/o left ankle pain after tripping over shoes just PTA.  Denies fall or hitting head.  Left leg and foot are neurovascularly in tact. Plain films: no acute fracture. Will tx as sprain. ACE Wrap applied.  Advised to f/u with PCP and [facility] for recheck of symptoms as needed. RICE home instructions provided. Return precautions provided. Pt verbalized understanding and agreement with tx plan.   I personally performed the services described in this documentation, which was scribed in my presence. The recorded information has been reviewed and is accurate.    Noland Fordyce, PA-C 01/17/14 [phone]

## 2014-01-17 NOTE — ED Notes (Signed)
Pt states she tripped over a pair of shoes and now has left ankle pain

## 2014-01-17 NOTE — Discharge Instructions (Signed)

## 2014-01-17 NOTE — ED Notes (Signed)
Wrapped left ankle and nonskid socks applied

## 2014-01-18 NOTE — ED Provider Notes (Signed)
Medical screening examination/treatment/procedure(s) were performed by non-physician practitioner and as supervising physician I was immediately available for consultation/collaboration.   EKG Interpretation None        Delice Bison Ward, DO 01/18/14 0700

## 2014-04-04 ENCOUNTER — Emergency Department (HOSPITAL_COMMUNITY): Payer: Medicare HMO

## 2014-04-04 ENCOUNTER — Emergency Department (HOSPITAL_COMMUNITY)
Admission: EM | Admit: 2014-04-04 | Discharge: 2014-04-05 | Disposition: A | Discharge status: Home / Self Care | Payer: Medicare HMO | Attending: Emergency Medicine | Admitting: Emergency Medicine

## 2014-04-04 ENCOUNTER — Encounter (HOSPITAL_COMMUNITY): Payer: Self-pay | Admitting: Emergency Medicine

## 2014-04-04 DIAGNOSIS — Z79899 Other long term (current) drug therapy: Secondary | ICD-10-CM | POA: Diagnosis not present

## 2014-04-04 DIAGNOSIS — R079 Chest pain, unspecified: Secondary | ICD-10-CM | POA: Insufficient documentation

## 2014-04-04 DIAGNOSIS — R059 Cough, unspecified: Secondary | ICD-10-CM | POA: Insufficient documentation

## 2014-04-04 DIAGNOSIS — R0789 Other chest pain: Secondary | ICD-10-CM | POA: Insufficient documentation

## 2014-04-04 DIAGNOSIS — R05 Cough: Secondary | ICD-10-CM | POA: Diagnosis not present

## 2014-04-04 LAB — BASIC METABOLIC PANEL
ANION GAP: 14 (ref 5–15)
BUN: 13 mg/dL (ref 6–23)
CHLORIDE: 103 meq/L (ref 96–112)
CO2: 24 mEq/L (ref 19–32)
Calcium: 8.9 mg/dL (ref 8.4–10.5)
Creatinine, Ser: 0.97 mg/dL (ref 0.50–1.10)
GFR, EST AFRICAN AMERICAN: 88 mL/min — AB (ref >90)
GFR, EST NON AFRICAN AMERICAN: 76 mL/min — AB (ref >90)
Glucose, Bld: 92 mg/dL (ref 70–99)
Potassium: 4.3 mEq/L (ref 3.7–5.3)
Sodium: 141 mEq/L (ref 137–147)

## 2014-04-04 LAB — CBC
HCT: 30.7 % — ABNORMAL LOW (ref 36.0–46.0)
Hemoglobin: 9.5 g/dL — ABNORMAL LOW (ref 12.0–15.0)
MCH: 22.7 pg — AB (ref 26.0–34.0)
MCHC: 30.9 g/dL (ref 30.0–36.0)
MCV: 73.3 fL — AB (ref 78.0–100.0)
PLATELETS: 613 10*3/uL — AB (ref 150–400)
RBC: 4.19 MIL/uL (ref 3.87–5.11)
RDW: 16.9 % — ABNORMAL HIGH (ref 11.5–15.5)
WBC: 8.6 10*3/uL (ref 4.0–10.5)

## 2014-04-04 LAB — I-STAT TROPONIN, ED: TROPONIN I, POC: 0 ng/mL (ref 0.00–0.08)

## 2014-04-04 NOTE — ED Notes (Signed)
Pt reports nonradiating chest tightness that started 30 mins ago with sob. Pt states she has been drinking water all day but seems like she can't get enough. sts she does not have diabetes.

## 2014-04-05 DIAGNOSIS — R0789 Other chest pain: Secondary | ICD-10-CM | POA: Diagnosis not present

## 2014-04-05 MED ORDER — BENZONATATE 200 MG PO CAPS: 200.0000 mg | ORAL_CAPSULE | Freq: Three times a day (TID) | ORAL | Status: DC | PRN

## 2014-04-05 MED ORDER — BENZONATATE 100 MG PO CAPS
200.0000 mg | ORAL_CAPSULE | Freq: Three times a day (TID) | ORAL | Status: DC | PRN
  Administered 2014-04-05: 200 mg via ORAL
  Filled 2014-04-05: qty 2

## 2014-04-05 NOTE — ED Provider Notes (Signed)
CSN: [id number]     Arrival date & time 04/04/14  2057 History   First MD Initiated Contact with Patient 04/05/14 0010     Chief Complaint  Patient presents with  . Chest Pain     (Consider location/radiation/quality/duration/timing/severity/associated sxs/prior Treatment) HPI 33 year old female presents to emergency department with complaint of chest pressure and shortness of breath.  Symptoms have been ongoing intermittently for the last 2 days.  Symptoms last a few seconds at a time and then resolve.  She reports yesterday she used her inhaler with resolution of the symptoms.  She was concerned as she has been more thirsty than usual.  She reports increased cough.  No fever or chills.  No productive sputum with cough.  Today tightness in her chest did not improve with epidural inhaler.  Patient used her albuterol just before checking it at triage.  She denies any wheezing.  Patient is asymptomatic at this time.  She denies any leg swelling or pain, non smoker, no prolonged immobilization, no recent surgeries, no exogenous hormones.  No family history of PE or DVT. History reviewed. No pertinent past medical history. History reviewed. No pertinent past surgical history. No family history on file. History  Substance Use Topics  . Smoking status: Never Smoker   . Smokeless tobacco: Not on file  . Alcohol Use: No   OB History   Grav Para Term Preterm Abortions TAB SAB Ect Mult Living                 Review of Systems  See History of Present Illness; otherwise all other systems are reviewed and negative   Allergies  Review of patient's allergies indicates no known allergies.  Home Medications   Prior to Admission medications   Medication Sig Start Date End Date Taking? Authorizing Provider  albuterol (PROVENTIL HFA;VENTOLIN HFA) 108 (90 BASE) MCG/ACT inhaler Inhale 2 puffs into the lungs every 6 (six) hours as needed for wheezing or shortness of breath.   Yes Historical Provider, MD    BP 119/56  Pulse 87  Temp(Src) 98.2 F (36.8 C) (Oral)  Resp 20  Ht 4\' 11"  (1.499 m)  Wt 309 lb (140.161 kg)  BMI 62.38 kg/m2  SpO2 99%  LMP 03/26/2014 Physical Exam  Nursing note and vitals reviewed. Constitutional: She is oriented to person, place, and time. She appears well-developed and well-nourished.  Morbidly obese female in no acute distress  HENT:  Head: Normocephalic and atraumatic.  Nose: Nose normal.  Mouth/Throat: Oropharynx is clear and moist.  Eyes: Conjunctivae and EOM are normal. Pupils are equal, round, and reactive to light.  Neck: Normal range of motion. Neck supple. No JVD present. No tracheal deviation present. No thyromegaly present.  Cardiovascular: Normal rate, regular rhythm, normal heart sounds and intact distal pulses.  Exam reveals no gallop and no friction rub.   No murmur heard. Pulmonary/Chest: Effort normal and breath sounds normal. No stridor. No respiratory distress. She has no wheezes. She has no rales. She exhibits no tenderness.  Abdominal: Soft. Bowel sounds are normal. She exhibits no distension and no mass. There is no tenderness. There is no rebound and no guarding.  Musculoskeletal: Normal range of motion. She exhibits no edema and no tenderness.  Lymphadenopathy:    She has no cervical adenopathy.  Neurological: She is alert and oriented to person, place, and time. She exhibits normal muscle tone. Coordination normal.  Skin: Skin is warm and dry. No rash noted. No erythema. No pallor.  Psychiatric: She has a normal mood and affect. Her behavior is normal. Judgment and thought content normal.    ED Course  Procedures (including critical care time) Labs Review Labs Reviewed  CBC - Abnormal; Notable for the following:    Hemoglobin 9.5 (*)    HCT 30.7 (*)    MCV 73.3 (*)    MCH 22.7 (*)    RDW 16.9 (*)    Platelets 613 (*)    All other components within normal limits  BASIC METABOLIC PANEL - Abnormal; Notable for the following:     GFR calc non Af Amer 76 (*)    GFR calc Af Amer 88 (*)    All other components within normal limits  I-STAT TROPOININ, ED    Imaging Review Dg Chest 2 View  04/04/2014   CLINICAL DATA:  Chest pain and shortness of breath.  EXAM: CHEST  2 VIEW  COMPARISON:  10/15/2013  FINDINGS: The heart size and mediastinal contours are within normal limits. Both lungs are clear. No pleural effusion or pneumothorax. The visualized skeletal structures are unremarkable.  IMPRESSION: No radiographic evidence of active cardiopulmonary disease.   Electronically Signed   By: Carlos Levering M.D.   On: 04/04/2014 22:22     EKG Interpretation None      Date: 04/05/2014  Rate: 107  Rhythm: sinus tachycardia  QRS Axis: normal  Intervals: normal  ST/T Wave abnormalities: normal  Conduction Disutrbances:none  Narrative Interpretation:   Old EKG Reviewed: none available   MDM   Final diagnoses:  Cough  Chest discomfort   33 year old female with intermittent chest tightness cough for 2 days.  Patient initially tachycardic 7, but recently used her albuterol inhaler.  Patient asymptomatic.  Symptoms do not seem consistent with PE, pneumonia, ischemic heart disease.  Patient to start on cough suppressant and followup with her primary care Dr.  Kalman Drape, MD 04/05/14 0030

## 2014-04-05 NOTE — Discharge Instructions (Signed)
for is a with a Cough, Adult  A cough is a reflex that helps clear your throat and airways. It can help heal the body or may be a reaction to an irritated airway. A cough may only last 2 or 3 weeks (acute) or may last more than 8 weeks (chronic).  CAUSES Acute cough:  Viral or bacterial infections. Chronic cough:  Infections.  Allergies.  Asthma.  Post-nasal drip.  Smoking.  Heartburn or acid reflux.  Some medicines.  Chronic lung problems (COPD).  Cancer. SYMPTOMS   Cough.  Fever.  Chest pain.  Increased breathing rate.  High-pitched whistling sound when breathing (wheezing).  Colored mucus that you cough up (sputum). TREATMENT   A bacterial cough may be treated with antibiotic medicine.  A viral cough must run its course and will not respond to antibiotics.  Your caregiver may recommend other treatments if you have a chronic cough. HOME CARE INSTRUCTIONS   Only take over-the-counter or prescription medicines for pain, discomfort, or fever as directed by your caregiver. Use cough suppressants only as directed by your caregiver.  Use a cold steam vaporizer or humidifier in your bedroom or home to help loosen secretions.  Sleep in a semi-upright position if your cough is worse at night.  Rest as needed.  Stop smoking if you smoke. SEEK IMMEDIATE MEDICAL CARE IF:   You have pus in your sputum.  Your cough starts to worsen.  You cannot control your cough with suppressants and are losing sleep.  You begin coughing up blood.  You have difficulty breathing.  You develop pain which is getting worse or is uncontrolled with medicine.  You have a fever. MAKE SURE YOU:   Understand these instructions.  Will watch your condition.  Will get help right away if you are not doing well or get worse. Document Released: 01/15/2011 Document Revised: 10/11/2011 Document Reviewed: 01/15/2011 [facility] Patient Information 2015 [place], [place]. This  information is not intended to replace advice given to you by your health care provider. Make sure you discuss any questions you have with your health care provider. and [employer] may help relieve the symptoms of a cough and cold. They add moisture to the air, which helps mucus to become thinner and less sticky. This makes it easier to breathe and cough up secretions. Cool mist vaporizers do not cause serious burns like hot mist vaporizers, which may also be called steamers or humidifiers. Vaporizers have not been proven to help with colds. You should not use a vaporizer if you are allergic to mold. HOME CARE INSTRUCTIONS  Follow the package instructions for the vaporizer.  Do not use anything other than distilled water in the vaporizer.  Do not run the vaporizer all of the time. This can cause mold or bacteria to grow in the vaporizer.  Clean the vaporizer after each time it is used.  Clean and dry the vaporizer well before storing it.  Stop using the vaporizer if worsening respiratory symptoms develop. Document Released: 04/15/2004 Document Revised: 07/24/2013 Document Reviewed: 12/06/2012 [facility] Patient Information 2015 [place], [place]. This information is not intended to replace advice given to you by your health care provider. Make sure you discuss any questions you have with your health care provider.  Chest Pain (Nonspecific) It is often hard to give a specific diagnosis for the cause of chest pain. There is always a chance that your pain could be related to something serious, such as a heart attack or a  blood clot in the lungs. You need to follow up with your health care provider for further evaluation. CAUSES   Heartburn.  Pneumonia or bronchitis.  Anxiety or stress.  Inflammation around your heart (pericarditis) or lung (pleuritis or pleurisy).  A blood clot in the lung.  A collapsed lung (pneumothorax). It can develop suddenly on its own  (spontaneous pneumothorax) or from trauma to the chest.  Shingles infection (herpes zoster virus). The chest wall is composed of bones, muscles, and cartilage. Any of these can be the source of the pain.  The bones can be bruised by injury.  The muscles or cartilage can be strained by coughing or overwork.  The cartilage can be affected by inflammation and become sore (costochondritis). DIAGNOSIS  Lab tests or other studies may be needed to find the cause of your pain. Your health care provider may have you take a test called an ambulatory electrocardiogram (ECG). An ECG records your heartbeat patterns over a 24-hour period. You may also have other tests, such as:  Transthoracic echocardiogram (TTE). During echocardiography, sound waves are used to evaluate how blood flows through your heart.  Transesophageal echocardiogram (TEE).  Cardiac monitoring. This allows your health care provider to monitor your heart rate and rhythm in real time.  Holter monitor. This is a portable device that records your heartbeat and can help diagnose heart arrhythmias. It allows your health care provider to track your heart activity for several days, if needed.  Stress tests by exercise or by giving medicine that makes the heart beat faster. TREATMENT   Treatment depends on what may be causing your chest pain. Treatment may include:  Acid blockers for heartburn.  Anti-inflammatory medicine.  Pain medicine for inflammatory conditions.  Antibiotics if an infection is present.  You may be advised to change lifestyle habits. This includes stopping smoking and avoiding alcohol, caffeine, and chocolate.  You may be advised to keep your head raised (elevated) when sleeping. This reduces the chance of acid going backward from your stomach into your esophagus. Most of the time, nonspecific chest pain will improve within 2-3 days with rest and mild pain medicine.  HOME CARE INSTRUCTIONS   If antibiotics  were prescribed, take them as directed. Finish them even if you start to feel better.  For the next few days, avoid physical activities that bring on chest pain. Continue physical activities as directed.  Do not use any tobacco products, including cigarettes, chewing tobacco, or electronic cigarettes.  Avoid drinking alcohol.  Only take medicine as directed by your health care provider.  Follow your health care provider's suggestions for further testing if your chest pain does not go away.  Keep any follow-up appointments you made. If you do not go to an appointment, you could develop lasting (chronic) problems with pain. If there is any problem keeping an appointment, call to reschedule. SEEK MEDICAL CARE IF:   Your chest pain does not go away, even after treatment.  You have a rash with blisters on your chest.  You have a fever. SEEK IMMEDIATE MEDICAL CARE IF:   You have increased chest pain or pain that spreads to your arm, neck, jaw, back, or abdomen.  You have shortness of breath.  You have an increasing cough, or you cough up blood.  You have severe back or abdominal pain.  You feel nauseous or vomit.  You have severe weakness.  You faint.  You have chills. This is an emergency. Do not wait to see if  the pain will go away. Get medical help at once. Call your local emergency services (911 in [place] Do not drive yourself to the hospital. MAKE SURE YOU:   Understand these instructions.  Will watch your condition.  Will get help right away if you are not doing well or get worse. Document Released: 04/28/2005 Document Revised: 07/24/2013 Document Reviewed: 02/22/2008 [facility] Patient Information 2015 [place], [place]. This information is not intended to replace advice given to you by your health care provider. Make sure you discuss any questions you have with your health care provider. Emergency Department Resource Guide 1) Find a Doctor and Pay Out of Pocket Although  you won't have to find out who is covered by your insurance plan, it is a good idea to ask around and get recommendations. You will then need to call the office and see if the doctor you have chosen will accept you as a new patient and what types of options they offer for patients who are self-pay. Some doctors offer discounts or will set up payment plans for their patients who do not have insurance, but you will need to ask so you aren't surprised when you get to your appointment.  2) Contact Your Local Health Department Not all health departments have doctors that can see patients for sick visits, but many do, so it is worth a call to see if yours does. If you don't know where your local health department is, you can check in your phone book. The CDC also has a tool to help you locate your state's health department, and many state websites also have listings of all of their local health departments.  3) Find a [facility] If your illness is not likely to be very severe or complicated, you may want to try a walk in clinic. These are popping up all over the country in pharmacies, drugstores, and shopping centers. They're usually staffed by nurse practitioners or physician assistants that have been trained to treat common illnesses and complaints. They're usually fairly quick and inexpensive. However, if you have serious medical issues or chronic medical problems, these are probably not your best option.  No Primary Care Doctor: - Call Health Connect at  [phone] - they can help you locate a primary care doctor that  accepts your insurance, provides certain services, etc. - Physician Referral Service- [phone]  Chronic Pain Problems: Organization         Address  Phone   Notes  [facility]  [phone] Patients need to be referred by their primary care doctor.   Medication Assistance: Organization         Address  Phone   Notes  [facility] Medication [facility] [address] [place], [place] [address] [phone] --Must be a resident of [facility] -- Must have NO insurance coverage whatsoever (no Medicaid/ Medicare, etc.) -- The pt. MUST have a primary care doctor that directs their care regularly and follows them in the community   MedAssist  [phone]   [employer]  [phone]    Agencies that provide inexpensive medical care: Organization         Address  Phone   Notes  Advance  [phone]   [facility]    [phone]   [facility] [place], [place] [address] [phone]   [place] [phone])  [place]    [phone]   [facility]    [phone]   [place] [address], [place] Phone:  [phone], Fax:  [phone] Hours of Operation:  9 am - 6 pm, M-F.  Also accepts Medicaid/Medicare and self-pay.  [facility] for [place] [address], [address], [place] Phone: [phone], Fax: [phone]. Hours of Operation:  8:30 am - 5:30 pm, M-F.  Also accepts Medicaid and self-pay.  [facility] [place] [address], [place] Phone: [phone]   [place], [place], [place] [phone], Ext. [phone] Mondays & Thursdays: 7-9 AM.  First 15 patients are seen on a first come, first serve basis.    [place] Providers:  Organization         Address  Phone   Notes  [facility] [address], [place] [phone] Also accepts self-pay patients.  [facility] [address], [place]  [phone]   [place], [address], [place] [phone]   [facility] [address], [place] [phone]   Lucianne Lei [address], [place]   [phone] Only accepts [place] Access [place] patients after they have their name applied to their card.   Self-Pay (no insurance) in [facility]:  Organization         Address  Phone   Notes  Sickle Cell Patients, [facility] [place] [phone]   [facility] [place] [phone]   [facility] [place]  [place], [place], [place] [phone]   [facility]/Dr. Osei-Bonsu  [address] [place] or [address], [place] [phone] Phone number for both [place] and [place] locations is the same.  [facility]gent [facility] and [facility] [address] [place] [phone]   [facility] [address] [place] or [address] [phone]   [facility] [address], [place] [phone], phone; [phone], fax Sees patients 1st and 3rd Saturday of every month.  Must not qualify for public or private insurance (i.e. Medicaid, Medicare, [place] Health Choice, Veterans' Benefits)  Household income should be no more than 200% of the poverty level The clinic cannot treat you if you are pregnant or think you are pregnant  Sexually transmitted diseases are not treated at the clinic.    Dental Care: Organization         Address  Phone  Notes  [facility] Department of [facility] [place] [phone] Accepts children up to age 54 who are enrolled in [place] or [place]; pregnant women with a Medicaid card; and children who have applied for Medicaid or [place] Health Choice, but were declined, whose parents can pay a reduced fee at time of service.  [facility] Department of [facility]  [address], [place] [phone] Accepts  children up to age 81 who are enrolled in [place] or [place]; pregnant women with a Medicaid card; and children who have applied for Medicaid or [place] Health Choice,  but were declined, whose parents can pay a reduced fee at time of service.  [place] Adult Dental Access PROGRAM  [place] [phone] Patients are seen by appointment only. Walk-ins are not accepted. [place] will see patients 23 years of age and older. Monday - Tuesday (8am-5pm) Most Wednesdays (8:30-5pm) $30 per visit, cash only  [facility] Adult Dental Access PROGRAM  [address], [facility] [phone] Patients are seen by appointment only. Walk-ins are not accepted. [place] will see patients 76 years of age and older. One Wednesday Evening (Monthly: Volunteer Based).  $30 per visit, cash only  [place]  [phone] for adults; Children under age 69, call Graduate Pediatric Dentistry at [phone]. Children aged 78-14, please call [phone] to request a pediatric application.  Dental services are provided in all areas of dental care including fillings, crowns and bridges, complete and partial dentures, implants, gum treatment, root canals, and extractions. Preventive care is also provided. Treatment is provided to both adults and children. Patients are selected via a lottery and there is often a waiting list.   [facility] [address], [place]  [phone] [email or web address]   [facility] [address] [place], [place] [phone], Ext. [phone] Second and Fourth Thursday of each month, opens at 6:30 AM; Clinic ends at 9 AM.  Patients are seen on a first-come first-served basis, and a limited number are seen during each clinic.   [facility]  [address] [place] Danker [place], [place] [phone]   Eligibility Requirements You must have lived in [place], [place], or [place] counties for at least the  last three months.   You cannot be eligible for state or federal sponsored [employer], including [employer], [place], or [employer].   You generally cannot be eligible for healthcare insurance through your employer.    How to apply: Eligibility screenings are held every Tuesday and Wednesday afternoon from 1:00 pm until 4:00 pm. You do not need an appointment for the interview!  [facility] [address] [place], [place]   [place]  [place] Department  [place]  [phone]    Behavioral Health Resources in the Community: Intensive Outpatient Programs Organization         Address  Phone  Notes  Grandfather McGregor. [address], [place], [place] [phone]   [facility] Outpatient [address], [place], [place]   ADS: Alcohol & Drug Svcs [address], [place], [place]   [place] [address],  [place], [place] or [phone]   Substance Abuse Resources Organization         Address  Phone  Notes  Alcohol and Drug Services  [phone]   [place]  [phone]   [facility] [place]   Chinita Pester  [phone]   Residential & Outpatient Substance Abuse Program  [phone]   Psychological Services Organization         Address  Phone  Notes  [facility]  [place]  [phone]   [place] [address] [place] or [phone]    Mobile Crisis Teams Organization         Address  Phone  Notes  Therapeutic  Alternatives, Mobile Crisis Care Unit  [phone]   Assertive Psychotherapeutic Services  [address] [place], [place]   [facility] [address], [place] [phone]     Self-Help/Support Groups Organization         Address  Phone             Notes  [place]. of [place] - variety of support groups  [place] Call for more information  Narcotics Anonymous (NA), [facility] [address], [employer] [place]  2 meetings at this location   Special educational needs teacher         Address  Phone  Notes  [facility] [place],    [place]  [phone]   [facility]  [address] [place] [address], [place], [place]   [place], [place] [phone] Admissions: 8am-3pm M-F  Incentives Substance [place] [address]    [place], [place] [phone]   [facility] [address] [place], [place], [place]   [facility] [address],  [place], [place]   [facility] Programs - Intensive Outpatient [address], [place], [place]   [facility] ([place].,  [place], [place] [phone] or [phone]   [facility]idential Treatment Services (RTS) [address] [place], [place] Accepts Medicaid  [facility] [place] [address],  [place] [phone] Substance Abuse/Addiction Treatment   [facility] Organization         Address  Phone  Notes  [facility]  [phone]   Domenic Schwab, PhD [address] [place], [place]   [phone] or [phone]   [place] [address] [place], [place] [phone]   [facility] [address] [place], [place] [phone] Insurance/Medicaid/sponsorship through [facility] [address], [address] [place]                                    [place], [place] [phone] [place] [address], [place] [phone]    Dr. Adele Schilder  [phone]   Free  [facility] [place] Dept. 1) [address], [place] 2) [place] 3)  [address] [address], [place] [phone]  [phone]   [place] [phone] or [phone] (After Hours)

## 2015-02-07 ENCOUNTER — Ambulatory Visit (INDEPENDENT_AMBULATORY_CARE_PROVIDER_SITE_OTHER): Payer: Commercial Managed Care - HMO | Admitting: Internal Medicine

## 2015-02-07 ENCOUNTER — Encounter: Payer: Self-pay | Admitting: Internal Medicine

## 2015-02-07 VITALS — BP 138/86 | HR 96 | Temp 98.0°F | Ht 59.0 in | Wt 311.4 lb

## 2015-02-07 DIAGNOSIS — Z23 Encounter for immunization: Secondary | ICD-10-CM | POA: Diagnosis not present

## 2015-02-07 DIAGNOSIS — D509 Iron deficiency anemia, unspecified: Secondary | ICD-10-CM

## 2015-02-07 DIAGNOSIS — Z6841 Body Mass Index (BMI) 40.0 and over, adult: Secondary | ICD-10-CM | POA: Diagnosis not present

## 2015-02-07 DIAGNOSIS — E559 Vitamin D deficiency, unspecified: Secondary | ICD-10-CM

## 2015-02-07 DIAGNOSIS — J4520 Mild intermittent asthma, uncomplicated: Secondary | ICD-10-CM | POA: Diagnosis not present

## 2015-02-07 DIAGNOSIS — Z7951 Long term (current) use of inhaled steroids: Secondary | ICD-10-CM

## 2015-02-07 DIAGNOSIS — J309 Allergic rhinitis, unspecified: Secondary | ICD-10-CM | POA: Insufficient documentation

## 2015-02-07 DIAGNOSIS — L309 Dermatitis, unspecified: Secondary | ICD-10-CM | POA: Insufficient documentation

## 2015-02-07 DIAGNOSIS — Z0000 Encounter for general adult medical examination without abnormal findings: Secondary | ICD-10-CM

## 2015-02-07 DIAGNOSIS — E785 Hyperlipidemia, unspecified: Secondary | ICD-10-CM

## 2015-02-07 LAB — POCT GLYCOSYLATED HEMOGLOBIN (HGB A1C): Hemoglobin A1C: 5.4

## 2015-02-07 LAB — CBC WITH DIFFERENTIAL/PLATELET
BASOS PCT: 0 % (ref 0–1)
Basophils Absolute: 0 10*3/uL (ref 0.0–0.1)
Eosinophils Absolute: 0.1 10*3/uL (ref 0.0–0.7)
Eosinophils Relative: 1 % (ref 0–5)
HCT: 31.4 % — ABNORMAL LOW (ref 36.0–46.0)
Hemoglobin: 9.4 g/dL — ABNORMAL LOW (ref 12.0–15.0)
Lymphocytes Relative: 30 % (ref 12–46)
Lymphs Abs: 2.1 10*3/uL (ref 0.7–4.0)
MCH: 21.8 pg — ABNORMAL LOW (ref 26.0–34.0)
MCHC: 29.9 g/dL — ABNORMAL LOW (ref 30.0–36.0)
MCV: 72.7 fL — ABNORMAL LOW (ref 78.0–100.0)
MONOS PCT: 6 % (ref 3–12)
MPV: 8.6 fL (ref 8.6–12.4)
Monocytes Absolute: 0.4 10*3/uL (ref 0.1–1.0)
NEUTROS ABS: 4.5 10*3/uL (ref 1.7–7.7)
NEUTROS PCT: 63 % (ref 43–77)
Platelets: 673 10*3/uL — ABNORMAL HIGH (ref 150–400)
RBC: 4.32 MIL/uL (ref 3.87–5.11)
RDW: 18.4 % — AB (ref 11.5–15.5)
WBC: 7.1 10*3/uL (ref 4.0–10.5)

## 2015-02-07 LAB — ANEMIA PANEL
%SAT: 5 % — AB (ref 20–55)
ABS Retic: 73.4 10*3/uL (ref 19.0–186.0)
FOLATE: 5.6 ng/mL
Ferritin: 8 ng/mL — ABNORMAL LOW (ref 10–291)
Iron: 21 ug/dL — ABNORMAL LOW (ref 42–145)
RBC.: 4.32 MIL/uL (ref 3.87–5.11)
Retic Ct Pct: 1.7 % (ref 0.4–2.3)
TIBC: 414 ug/dL (ref 250–470)
UIBC: 393 ug/dL (ref 125–400)
Vitamin B-12: 269 pg/mL (ref 211–911)

## 2015-02-07 LAB — COMPLETE METABOLIC PANEL WITH GFR
ALBUMIN: 4 g/dL (ref 3.5–5.2)
ALT: 12 U/L (ref 0–35)
AST: 15 U/L (ref 0–37)
Alkaline Phosphatase: 56 U/L (ref 39–117)
BUN: 14 mg/dL (ref 6–23)
CHLORIDE: 103 meq/L (ref 96–112)
CO2: 24 mEq/L (ref 19–32)
Calcium: 9.4 mg/dL (ref 8.4–10.5)
Creat: 0.69 mg/dL (ref 0.50–1.10)
GFR, Est Non African American: >89 mL/min
GLUCOSE: 90 mg/dL (ref 70–99)
POTASSIUM: 4.1 meq/L (ref 3.5–5.3)
SODIUM: 138 meq/L (ref 135–145)
TOTAL PROTEIN: 7.3 g/dL (ref 6.0–8.3)
Total Bilirubin: 0.3 mg/dL (ref 0.2–1.2)

## 2015-02-07 LAB — LIPID PANEL
Cholesterol: 191 mg/dL (ref 0–200)
HDL: 51 mg/dL (ref >=46)
LDL Cholesterol: 116 mg/dL — ABNORMAL HIGH (ref 0–99)
Total CHOL/HDL Ratio: 3.7 Ratio
Triglycerides: 118 mg/dL (ref <150)
VLDL: 24 mg/dL (ref 0–40)

## 2015-02-07 LAB — GLUCOSE, CAPILLARY: Glucose-Capillary: 105 mg/dL — ABNORMAL HIGH (ref 65–99)

## 2015-02-07 LAB — TSH: TSH: 1.576 u[IU]/mL (ref 0.350–4.500)

## 2015-02-07 LAB — HIV ANTIBODY (ROUTINE TESTING W REFLEX): HIV 1&2 Ab, 4th Generation: NONREACTIVE

## 2015-02-07 MED ORDER — HYDROCORTISONE 1 % EX CREA: TOPICAL_CREAM | CUTANEOUS | Status: DC

## 2015-02-07 MED ORDER — ALBUTEROL SULFATE HFA 108 (90 BASE) MCG/ACT IN AERS: 2.0000 | INHALATION_SPRAY | Freq: Four times a day (QID) | RESPIRATORY_TRACT | Status: DC | PRN

## 2015-02-07 NOTE — Progress Notes (Signed)
Internal Medicine Clinic Attending  Case discussed with Dr. Rabbani at the time of the visit.  We reviewed the resident's history and exam and pertinent patient test results.  I agree with the assessment, diagnosis, and plan of care documented in the resident's note.  

## 2015-02-07 NOTE — Progress Notes (Signed)
Patient ID: Suzanne Lewis, female   DOB: [birth year], 34 y.o.   MRN: [id number]    Subjective:   Patient ID: Suzanne Lewis female   DOB: Oct 09, 1980 34 y.o.   MRN: [id number]  HPI: [name] is a 34 y.o. pleasant woman with past medical history of asthma, microcytic anemia, and morbid obesity who presents as a new patient to establish care with our clinic.   She has history of asthma since childhood that is triggered by weather change (warm), seasonal allergic rhinitis, and URI's. She reports symptoms of dyspnea, cough, and chest tightness with occasional wheezing that occur about twice a week with no nighttime symptoms and very infrequent usage of albuterol rescue inhaler. She has never been hospitalized for asthma exacerbation and last flare was about 7 months ago. She does not use inhaled corticosteroid therapy and has not had pulmonary function testing in the past.    Her last CBC on 04/04/14 revealed microcytic anemia but she is unaware of having anemia. She denies irregular, painful, or heavy periods. She had pelvic/vaginal [place] on 01/20/04 that did not reveal evidence of uterine fibroids. She denies hematochezia, melena, or hematuria. She denies family history of colon cancer. She is not on blood thinners or iron supplementation. She has not had recent pap smear testing. She reports chronic fatigue and craving for ice.   She reports rash of left lower extremity that began with pruritis and has been present for 1 year with progressive dark discoloration. She usea moisturizer on the area due to dry skin.   She has morbid obesity with BMI of 62. She denies tobacco, alcohol, or drug use. She is not taking any medications. She is currently unemployed, engaged, and has 1 child born via c-section in 2004.    She would like to have screening tests for diabetes, hyperlipidemia, thyroid disease, and vitamin D deficiency. Her mother recently died at age 88 due to diabetes.   She would also like  to have tdap vaccination.      No past medical history on file. Current Outpatient Prescriptions  Medication Sig Dispense Refill  . albuterol (PROVENTIL HFA;VENTOLIN HFA) 108 (90 BASE) MCG/ACT inhaler Inhale 2 puffs into the lungs every 6 (six) hours as needed for wheezing or shortness of breath.    . benzonatate (TESSALON) 200 MG capsule Take 1 capsule (200 mg total) by mouth 3 (three) times daily as needed for cough. 20 capsule 0   No current facility-administered medications for this visit.   No family history on file. History   Social History  . Marital Status: Single    Spouse Name: N/A  . Number of Children: N/A  . Years of Education: N/A   Social History Main Topics  . Smoking status: Never Smoker   . Smokeless tobacco: Not on file  . Alcohol Use: No  . Drug Use: No  . Sexual Activity: Not on file   Other Topics Concern  . None   Social History Narrative   Review of Systems: Review of Systems  Constitutional: Positive for malaise/fatigue (chronic). Negative for fever and chills.  HENT: Negative for congestion, ear pain, hearing loss and sore throat.   Eyes: Negative for blurred vision.  Respiratory: Negative for cough, shortness of breath and wheezing.        Asthma  Cardiovascular: Negative for chest pain and leg swelling.  Gastrointestinal: Negative for nausea, vomiting, abdominal pain, diarrhea, constipation, blood in stool and melena.  Genitourinary: Negative for dysuria, urgency,  frequency and hematuria.  Musculoskeletal: Positive for joint pain (left knee pain for 1 week). Negative for myalgias.  Skin: Positive for rash (left LE for past year).  Neurological: Negative for dizziness, sensory change and headaches.  Endo/Heme/Allergies: Positive for environmental allergies (seasonal). Negative for polydipsia.  Psychiatric/Behavioral: Negative for substance abuse.    Objective:  Physical Exam: Filed Vitals:   02/07/15 0941  BP: 138/86  Pulse: 96  Temp:  98 F (36.7 C)  Height: 4\' 11"  (1.499 m)  Weight: 311 lb 6.4 oz (141.25 kg)  SpO2: 100%    Physical Exam  Constitutional: She is oriented to person, place, and time. She appears well-developed and well-nourished. No distress.  Obese  HENT:  Head: Normocephalic and atraumatic.  Right Ear: External ear normal.  Left Ear: External ear normal.  Nose: Nose normal.  Mouth/Throat: Oropharynx is clear and moist. No oropharyngeal exudate.  Eyes: Conjunctivae and EOM are normal. Pupils are equal, round, and reactive to light. Right eye exhibits no discharge. Left eye exhibits no discharge.  Neck: Normal range of motion. Neck supple.  Cardiovascular: Normal rate, regular rhythm and normal heart sounds.   No murmur heard. Pulmonary/Chest: Effort normal and breath sounds normal. No respiratory distress. She has no wheezes. She has no rales. She exhibits no tenderness.  Abdominal: Soft. Bowel sounds are normal. She exhibits no distension. There is no tenderness. There is no rebound and no guarding.  Musculoskeletal: Normal range of motion. She exhibits no edema or tenderness.  Neurological: She is alert and oriented to person, place, and time. No cranial nerve deficit.  Normal 5/5 muscle strength and normal sensation to light touch of extremities.   Skin: Skin is warm and dry. Rash (left lower extremity dry with darkend skin) noted. She is not diaphoretic. No erythema. No pallor.  Psychiatric: She has a normal mood and affect. Her behavior is normal. Judgment and thought content normal.    Assessment & Plan:   Please see problem list for problem-based assessment and plan

## 2015-02-07 NOTE — Patient Instructions (Signed)
-  Use albuterol inhaler only as needed for wheezing and shortness of breath -Use moisturizer and hydrocortisone 1% cream twice a day for your eczema on your leg -Will check your bloodwork and call you with the results -Will give you a tetanus shot today -Please schedule a pap smear test  -Very nice meeting you!   General Instructions:   Thank you for bringing your medicines today. This helps [place] keep you safe from mistakes.   Progress Toward Treatment Goals:  No flowsheet data found.  Self Care Goals & Plans:  No flowsheet data found.  No flowsheet data found.   Care Management & Community Referrals:  No flowsheet data found.

## 2015-02-08 LAB — VITAMIN D 25 HYDROXY (VIT D DEFICIENCY, FRACTURES): Vit D, 25-Hydroxy: 6 ng/mL — ABNORMAL LOW (ref 30–100)

## 2015-02-09 ENCOUNTER — Encounter: Payer: Self-pay | Admitting: Internal Medicine

## 2015-02-09 DIAGNOSIS — E559 Vitamin D deficiency, unspecified: Secondary | ICD-10-CM | POA: Insufficient documentation

## 2015-02-09 DIAGNOSIS — E785 Hyperlipidemia, unspecified: Secondary | ICD-10-CM | POA: Insufficient documentation

## 2015-02-09 MED ORDER — FERROUS SULFATE 325 (65 FE) MG PO TABS: 325.0000 mg | ORAL_TABLET | Freq: Every day | ORAL | Status: DC

## 2015-02-09 MED ORDER — VITAMIN D (ERGOCALCIFEROL) 1.25 MG (50000 UNIT) PO CAPS: 50000.0000 [IU] | ORAL_CAPSULE | ORAL | Status: DC

## 2015-02-09 NOTE — Assessment & Plan Note (Signed)
-  Obtain screening HIV Ab ---> non-reactive -Pt received tdap vaccination today on 02/07/15 -Pt to schedule visit for pap smear testing

## 2015-02-09 NOTE — Assessment & Plan Note (Addendum)
Assessment: Pt with mild intermittent asthma who presents with no exacerbation.   Plan:  -Refill albuterol inhaler 2 puffs Q 6 hr PRN -Consider pulmonary function testing as pt has never had in the past

## 2015-02-09 NOTE — Assessment & Plan Note (Addendum)
Assessment Pt with morbid obesity with BMI of 62.86.   Plan: -BMI 62.86 not at goal <25, encourage weight loss -Obtain CMP ---> normal -Obtain lipid panel ---> LDL 116, pt not in statin benefit group  -Obtain CBG ---> 105 -Obtain A1c ---> 5.4  -Obtain TSH level ---> normal

## 2015-02-09 NOTE — Assessment & Plan Note (Signed)
Assessment: Pt with no prior lipid panel with morbid obesity found to have hyperlipidemia not in statin benefit group.   Plan:  -Obtain lipid panel ----> LDL 116  -Pt not in statin therapy benefit group -Encourage lifestyle modification

## 2015-02-09 NOTE — Assessment & Plan Note (Signed)
Assessment: Pt with probable eczema of left lower extremity in setting of asthma and allergic rhinitis who presents with dry skin with no overlying infection.   Plan:  -Pt instructed to use OTC moisturizer  -Prescribe hydrocortisone cream 1% BID PRN

## 2015-02-09 NOTE — Assessment & Plan Note (Signed)
Assessment: Pt with no prior vitamin D level who presents with no recent fall or fracture found to have vitamin D deficiency.   Plan:  -Obtain 25-OH vitamin D level ----> low at 6 consistent with deficiency  -Prescribe ergocalciferol 50 K U weekly for 8 weeks -Repeat 25-OH vitamin D level after completion of therapy

## 2015-02-09 NOTE — Assessment & Plan Note (Addendum)
Assessment: Pt is menstruating woman with chronic microcytic anemia with last Hg of 9.5 on 04/04/14 with no prior anemia panel and unclear baseline who presents with no active bleeding or hemodynamic instability.   Plan:  -Obtain CBC w/diff ---> Hg  9.4  -Obtain anemia panel ----> ferritin low at 8 and iron low at 21  -Prescribe ferrous sulfate 325 mg daily and reassess CBC in 3 months  -Consider repeating vaginal/pelvic US to reassess for uterine fibroids  -Consider stool card testing at next visit

## 2015-03-03 ENCOUNTER — Emergency Department (HOSPITAL_COMMUNITY): Payer: Commercial Managed Care - HMO

## 2015-03-03 ENCOUNTER — Emergency Department (HOSPITAL_COMMUNITY)
Admission: EM | Admit: 2015-03-03 | Discharge: 2015-03-04 | Disposition: A | Discharge status: Home / Self Care | Payer: Commercial Managed Care - HMO | Attending: Emergency Medicine | Admitting: Emergency Medicine

## 2015-03-03 ENCOUNTER — Encounter (HOSPITAL_COMMUNITY): Payer: Self-pay | Admitting: Emergency Medicine

## 2015-03-03 DIAGNOSIS — Z79899 Other long term (current) drug therapy: Secondary | ICD-10-CM | POA: Insufficient documentation

## 2015-03-03 DIAGNOSIS — R0602 Shortness of breath: Secondary | ICD-10-CM | POA: Diagnosis present

## 2015-03-03 DIAGNOSIS — D509 Iron deficiency anemia, unspecified: Secondary | ICD-10-CM | POA: Diagnosis not present

## 2015-03-03 DIAGNOSIS — J45901 Unspecified asthma with (acute) exacerbation: Secondary | ICD-10-CM | POA: Insufficient documentation

## 2015-03-03 DIAGNOSIS — E559 Vitamin D deficiency, unspecified: Secondary | ICD-10-CM | POA: Insufficient documentation

## 2015-03-03 DIAGNOSIS — D649 Anemia, unspecified: Secondary | ICD-10-CM

## 2015-03-03 LAB — BASIC METABOLIC PANEL
ANION GAP: 9 (ref 5–15)
BUN: 13 mg/dL (ref 6–20)
CHLORIDE: 104 mmol/L (ref 101–111)
CO2: 24 mmol/L (ref 22–32)
Calcium: 9.5 mg/dL (ref 8.9–10.3)
Creatinine, Ser: 0.82 mg/dL (ref 0.44–1.00)
GFR calc Af Amer: >60 mL/min (ref >60)
GFR calc non Af Amer: >60 mL/min (ref >60)
Glucose, Bld: 157 mg/dL — ABNORMAL HIGH (ref 65–99)
Potassium: 3.6 mmol/L (ref 3.5–5.1)
Sodium: 137 mmol/L (ref 135–145)

## 2015-03-03 LAB — CBC WITH DIFFERENTIAL/PLATELET
Basophils Absolute: 0 10*3/uL (ref 0.0–0.1)
Basophils Relative: 1 % (ref 0–1)
EOS PCT: 1 % (ref 0–5)
Eosinophils Absolute: 0.1 10*3/uL (ref 0.0–0.7)
HCT: 30.7 % — ABNORMAL LOW (ref 36.0–46.0)
Hemoglobin: 9.5 g/dL — ABNORMAL LOW (ref 12.0–15.0)
Lymphocytes Relative: 26 % (ref 12–46)
Lymphs Abs: 2.1 10*3/uL (ref 0.7–4.0)
MCH: 23.3 pg — AB (ref 26.0–34.0)
MCHC: 30.9 g/dL (ref 30.0–36.0)
MCV: 75.2 fL — AB (ref 78.0–100.0)
Monocytes Absolute: 0.3 10*3/uL (ref 0.1–1.0)
Monocytes Relative: 3 % (ref 3–12)
NEUTROS PCT: 69 % (ref 43–77)
Neutro Abs: 5.8 10*3/uL (ref 1.7–7.7)
PLATELETS: 533 10*3/uL — AB (ref 150–400)
RBC: 4.08 MIL/uL (ref 3.87–5.11)
RDW: 19.7 % — AB (ref 11.5–15.5)
WBC: 8.3 10*3/uL (ref 4.0–10.5)

## 2015-03-03 MED ORDER — PREDNISONE 20 MG PO TABS: 40.0000 mg | ORAL_TABLET | Freq: Every day | ORAL | Status: DC

## 2015-03-03 MED ORDER — ALBUTEROL SULFATE HFA 108 (90 BASE) MCG/ACT IN AERS: 2.0000 | INHALATION_SPRAY | Freq: Four times a day (QID) | RESPIRATORY_TRACT | Status: DC | PRN

## 2015-03-03 MED ORDER — IPRATROPIUM-ALBUTEROL 0.5-2.5 (3) MG/3ML IN SOLN
3.0000 mL | Freq: Once | RESPIRATORY_TRACT | Status: AC
  Administered 2015-03-03: 3 mL via RESPIRATORY_TRACT
  Filled 2015-03-03: qty 3

## 2015-03-03 MED ORDER — ALBUTEROL SULFATE HFA 108 (90 BASE) MCG/ACT IN AERS
2.0000 | INHALATION_SPRAY | Freq: Once | RESPIRATORY_TRACT | Status: AC
  Administered 2015-03-04: 2 via RESPIRATORY_TRACT
  Filled 2015-03-03: qty 6.7

## 2015-03-03 NOTE — ED Provider Notes (Signed)
CSN: [id number]     Arrival date & time 03/03/15  2050 History   First MD Initiated Contact with Patient 03/03/15 2103     Chief Complaint  Patient presents with  . Shortness of Breath   Suzanne Lewis is a 34 y.o. female with a history of asthma, allergic rhinitis, morbid obesity, iron deficiency anemia and hyperlipidemia who presents to the emergency department complaining of an asthma attack that occurred today. The patient reports she was outside around 2:30 today when she began having increasing chest tightness and wheezing. Patient reports using her albuterol rescue inhaler 5 times prior to calling EMS. EMS provided her with 5 mg albuterol, 0.5 mg Atrovent and 125 mg of Solu-Medrol and round. At the time of my evaluation the patient reports feeling better and only complains of some mild chest tightness. She denies feeling short of breath currently. She reports having a slight cough that is productive today. She denies any fevers, chills, leg pain, leg swelling, chest pain, palpitations, abdominal pain, nausea, vomiting, or rashes.  (Consider location/radiation/quality/duration/timing/severity/associated sxs/prior Treatment) HPI  Past Medical History  Diagnosis Date  . Mild intermittent asthma   . Allergic rhinitis   . Morbid obesity   . Iron deficiency anemia   . Hyperlipidemia   . Vitamin D deficiency    Past Surgical History  Procedure Laterality Date  . Cesarean section  2004   Family History  Problem Relation Age of Onset  . Diabetes Mother     died at age 59  . Heart failure Mother    History  Substance Use Topics  . Smoking status: Never Smoker   . Smokeless tobacco: Not on file  . Alcohol Use: No   OB History    No data available     Review of Systems  Constitutional: Negative for fever and chills.  HENT: Negative for congestion and sore throat.   Eyes: Negative for visual disturbance.  Respiratory: Positive for cough, chest tightness, shortness of breath  and wheezing.   Cardiovascular: Negative for chest pain, palpitations and leg swelling.  Gastrointestinal: Negative for nausea, vomiting and abdominal pain.  Genitourinary: Negative for dysuria.  Musculoskeletal: Negative for back pain and neck pain.  Skin: Negative for rash.  Neurological: Negative for dizziness, light-headedness, numbness and headaches.      Allergies  Review of patient's allergies indicates no known allergies.  Home Medications   Prior to Admission medications   Medication Sig Start Date End Date Taking? Authorizing Provider  ferrous sulfate 325 (65 FE) MG tablet Take 1 tablet (325 mg total) by mouth daily. 02/09/15 02/09/16 Yes Marjan Rabbani, MD  hydrocortisone cream 1 % Apply to affected area 2 times daily as needed 02/07/15 02/07/16 Yes Marjan Rabbani, MD  Vitamin D, Ergocalciferol, (DRISDOL) 50000 UNITS CAPS capsule Take 1 capsule (50,000 Units total) by mouth every 7 (seven) days. 02/09/15  Yes Marjan Rabbani, MD  albuterol (PROVENTIL HFA;VENTOLIN HFA) 108 (90 BASE) MCG/ACT inhaler Inhale 2 puffs into the lungs every 6 (six) hours as needed for wheezing or shortness of breath. 03/03/15   Waynetta Pean, PA-C  predniSONE (DELTASONE) 20 MG tablet Take 2 tablets (40 mg total) by mouth daily. 03/03/15   Waynetta Pean, PA-C   BP 132/77 mmHg  Pulse 101  Temp(Src) 97.9 F (36.6 C) (Oral)  Resp 24  SpO2 100%  LMP 01/25/2015 Physical Exam  Constitutional: She is oriented to person, place, and time. She appears well-developed and well-nourished. No distress.  Nontoxic appearing.  HENT:  Head: Normocephalic and atraumatic.  Right Ear: External ear normal.  Left Ear: External ear normal.  Mouth/Throat: Oropharynx is clear and moist. No oropharyngeal exudate.  Eyes: Conjunctivae are normal. Pupils are equal, round, and reactive to light. Right eye exhibits no discharge. Left eye exhibits no discharge.  Neck: Normal range of motion. Neck supple. No JVD present. No tracheal  deviation present.  Cardiovascular: Normal rate, regular rhythm, normal heart sounds and intact distal pulses.  Exam reveals no gallop and no friction rub.   No murmur heard. Bilateral radial, posterior tibialis and dorsalis pedis pulses are intact.    Pulmonary/Chest: Effort normal. No respiratory distress. She has no wheezes. She has no rales. She exhibits no tenderness.  Lungs sounds very slightly diminished bilaterally. No wheezes appreciated. No rales or rhonchi. Patient speaking in full sentences.  Abdominal: Soft. There is no tenderness. There is no guarding.  Musculoskeletal: She exhibits no edema or tenderness.  No lower extremity edema or tenderness.  Lymphadenopathy:    She has no cervical adenopathy.  Neurological: She is alert and oriented to person, place, and time. Coordination normal.  Skin: Skin is warm and dry. No rash noted. She is not diaphoretic. No erythema. No pallor.  Psychiatric: She has a normal mood and affect. Her behavior is normal.  Nursing note and vitals reviewed.   ED Course  Procedures (including critical care time) Labs Review Labs Reviewed  BASIC METABOLIC PANEL - Abnormal; Notable for the following:    Glucose, Bld 157 (*)    All other components within normal limits  CBC WITH DIFFERENTIAL/PLATELET - Abnormal; Notable for the following:    Hemoglobin 9.5 (*)    HCT 30.7 (*)    MCV 75.2 (*)    MCH 23.3 (*)    RDW 19.7 (*)    Platelets 533 (*)    All other components within normal limits    Imaging Review Dg Chest 2 View  03/03/2015   CLINICAL DATA:  Shortness of breath.  Asthma.  Wheezing.  EXAM: CHEST  2 VIEW  COMPARISON:  04/04/2014.  FINDINGS: Normal sized heart.  Clear lungs.  Normal appearing bones.  IMPRESSION: Normal examination.   Electronically Signed   By: Claudie Revering M.D.   On: 03/03/2015 21:51     EKG Interpretation None      Filed Vitals:   03/03/15 2057 03/03/15 2112  BP: 132/77   Pulse: 101   Temp: 97.9 F (36.6 C)    TempSrc: Oral   Resp: 24   SpO2: 100% 100%     MDM   Meds given in ED:  Medications  albuterol (PROVENTIL HFA;VENTOLIN HFA) 108 (90 BASE) MCG/ACT inhaler 2 puff (not administered)  ipratropium-albuterol (DUONEB) 0.5-2.5 (3) MG/3ML nebulizer solution 3 mL (3 mLs Nebulization Given 03/03/15 2149)    New Prescriptions   ALBUTEROL (PROVENTIL HFA;VENTOLIN HFA) 108 (90 BASE) MCG/ACT INHALER    Inhale 2 puffs into the lungs every 6 (six) hours as needed for wheezing or shortness of breath.   PREDNISONE (DELTASONE) 20 MG TABLET    Take 2 tablets (40 mg total) by mouth daily.    Final diagnoses:  Asthma exacerbation  Anemia, unspecified anemia type   This is a 34 y.o. female with a history of asthma, allergic rhinitis, morbid obesity, iron deficiency anemia and hyperlipidemia who presents to the emergency department complaining of an asthma attack that occurred today. The patient reports she was outside around 2:30 today when she began having  increasing chest tightness and wheezing. Patient reports using her albuterol rescue inhaler 5 times prior to calling EMS. EMS provided her with 5 mg albuterol, 0.5 mg Atrovent and 125 mg of Solu-Medrol and round. At the time of my evaluation the patient reports feeling better and only complains of some mild chest tightness. She denies feeling short of breath currently. On exam the patient is afebrile nontoxic appearing. Patient has slightly diminished lung sounds bilaterally. No wheezing noted. No rales or rhonchi noted. Chest x-ray is unremarkable. CBC shows stable hemoglobin at 9.5 which an improvement from her previous. It is consistent with her history of iron deficiency anemia. She reports taking iron daily. I encouraged her to continue this and to follow up with PCP about her anemia.  BMP is remarkable only for glucose of 157 which I advised her to follow up on. Had reevaluation the patient reports her chest tightness and shortness of breath have completely  resolved. She reports feeling ready for discharge. Patient received a duoneb in the ED. Patient received 125 of Solu-Medrol by EMS. We'll discharge a prescription for prednisone as well as a refill on her albuterol inhaler. I encouraged her to follow-up with her PCP for further management of her asthma. Strict return precautions provided. I advised the patient to follow-up with their primary care provider this week. I advised the patient to return to the emergency department with new or worsening symptoms or new concerns. The patient verbalized understanding and agreement with plan.    This patient was discussed with Dr. Billy Fischer who agrees with assessment and plan.   Waynetta Pean, PA-C 03/03/15 2332  Gareth Morgan, MD 03/04/15 1320

## 2015-03-03 NOTE — ED Notes (Signed)
Pt arrived to the ED with a complaint of shortness of breath.  Pt was recently diagnosed with asthma.  Pt states that she became short of breath this afternoon.  Pt used her rescue inhaler 5 times prior to EMS arrival.  EMS has given pt 5 mg of albuterol and 0.5 of Atrovent in a breathing treatment.  Pt was given 125 mg of solumedrol as well by EMS prior to arrival.  Pt is presently still on her breathing treatment.  Pt has slight expiratory wheezing in the right left lung field.

## 2015-03-03 NOTE — ED Notes (Signed)
Bed: WA09 Expected date:  Expected time:  Means of arrival:  Comments: EMS/F/SOB/wheezing

## 2015-03-03 NOTE — Discharge Instructions (Signed)
Asthma, Acute Bronchospasm °Acute bronchospasm caused by asthma is also referred to as an asthma attack. Bronchospasm means your air passages become narrowed. The narrowing is caused by inflammation and tightening of the muscles in the air tubes (bronchi) in your lungs. This can make it hard to breathe or cause you to wheeze and cough. °CAUSES °Possible triggers are: °· Animal dander from the skin, hair, or feathers of animals. °· Dust mites contained in house dust. °· Cockroaches. °· Pollen from trees or grass. °· Mold. °· Cigarette or tobacco smoke. °· Air pollutants such as dust, household cleaners, hair sprays, aerosol sprays, paint fumes, strong chemicals, or strong odors. °· Cold air or weather changes. Cold air may trigger inflammation. Winds increase molds and pollens in the air. °· Strong emotions such as crying or laughing hard. °· Stress. °· Certain medicines such as aspirin or beta-blockers. °· Sulfites in foods and drinks, such as dried fruits and wine. °· Infections or inflammatory conditions, such as a flu, cold, or inflammation of the nasal membranes (rhinitis). °· Gastroesophageal reflux disease (GERD). GERD is a condition where stomach acid backs up into your esophagus. °· Exercise or strenuous activity. °SIGNS AND SYMPTOMS  °· Wheezing. °· Excessive coughing, particularly at night. °· Chest tightness. °· Shortness of breath. °DIAGNOSIS  °Your health care provider will ask you about your medical history and perform a physical exam. A chest X-ray or blood testing may be performed to look for other causes of your symptoms or other conditions that may have triggered your asthma attack.  °TREATMENT  °Treatment is aimed at reducing inflammation and opening up the airways in your lungs.  Most asthma attacks are treated with inhaled medicines. These include quick relief or rescue medicines (such as bronchodilators) and controller medicines (such as inhaled corticosteroids). These medicines are sometimes  given through an inhaler or a nebulizer. Systemic steroid medicine taken by mouth or given through an IV tube also can be used to reduce the inflammation when an attack is moderate or severe. Antibiotic medicines are only used if a bacterial infection is present.  °HOME CARE INSTRUCTIONS  °· Rest. °· Drink plenty of liquids. This helps the mucus to remain thin and be easily coughed up. Only use caffeine in moderation and do not use alcohol until you have recovered from your illness. °· Do not smoke. Avoid being exposed to secondhand smoke. °· You play a critical role in keeping yourself in good health. Avoid exposure to things that cause you to wheeze or to have breathing problems. °· Keep your medicines up-to-date and available. Carefully follow your health care provider's treatment plan. °· Take your medicine exactly as prescribed. °· When pollen or pollution is bad, keep windows closed and use an air conditioner or go to places with air conditioning. °· Asthma requires careful medical care. See your health care provider for a follow-up as advised. If you are more than [redacted] weeks pregnant and you were prescribed any new medicines, let your obstetrician know about the visit and how you are doing. Follow up with your health care provider as directed. °· After you have recovered from your asthma attack, make an appointment with your outpatient doctor to talk about ways to reduce the likelihood of future attacks. If you do not have a doctor who manages your asthma, make an appointment with a primary care doctor to discuss your asthma. °SEEK IMMEDIATE MEDICAL CARE IF:  °· You are getting worse. °· You have trouble breathing. If severe, call your local   emergency services (911 in the [place] °· You develop chest pain or discomfort. °· You are vomiting. °· You are not able to keep fluids down. °· You are coughing up yellow, green, brown, or bloody sputum. °· You have a fever and your symptoms suddenly get worse. °· You have  trouble swallowing. °MAKE SURE YOU:  °· Understand these instructions. °· Will watch your condition. °· Will get help right away if you are not doing well or get worse. °Document Released: 11/03/2006 Document Revised: 07/24/2013 Document Reviewed: 01/24/2013 °[employer]® Patient Information ©2015 [employer]. This information is not intended to replace advice given to you by your health care provider. Make sure you discuss any questions you have with your health care provider. ° °Asthma Attack Prevention °Although there is no way to prevent asthma from starting, you can take steps to control the disease and reduce its symptoms. Learn about your asthma and how to control it. Take an active role to control your asthma by working with your health care provider to create and follow an asthma action plan. An asthma action plan guides you in: °· Taking your medicines properly. °· Avoiding things that set off your asthma or make your asthma worse (asthma triggers). °· Tracking your level of asthma control. °· Responding to worsening asthma. °· Seeking emergency care when needed. °To track your asthma, keep records of your symptoms, check your peak flow number using a handheld device that shows how well air moves out of your lungs (peak flow meter), and get regular asthma checkups.  °WHAT ARE SOME WAYS TO PREVENT AN ASTHMA ATTACK? °· Take medicines as directed by your health care provider. °· Keep track of your asthma symptoms and level of control. °· With your health care provider, write a detailed plan for taking medicines and managing an asthma attack. Then be sure to follow your action plan. Asthma is an ongoing condition that needs regular monitoring and treatment. °· Identify and avoid asthma triggers. Many outdoor allergens and irritants (such as pollen, mold, cold air, and air pollution) can trigger asthma attacks. Find out what your asthma triggers are and take steps to avoid them. °· Monitor your breathing. Learn  to recognize warning signs of an attack, such as coughing, wheezing, or shortness of breath. Your lung function may decrease before you notice any signs or symptoms, so regularly measure and record your peak airflow with a home peak flow meter. °· Identify and treat attacks early. If you act quickly, you are less likely to have a severe attack. You will also need less medicine to control your symptoms. When your peak flow measurements decrease and alert you to an upcoming attack, take your medicine as instructed and immediately stop any activity that may have triggered the attack. If your symptoms do not improve, get medical help. °· Pay attention to increasing quick-relief inhaler use. If you find yourself relying on your quick-relief inhaler, your asthma is not under control. See your health care provider about adjusting your treatment. °WHAT CAN MAKE MY SYMPTOMS WORSE? °A number of common things can set off or make your asthma symptoms worse and cause temporary increased inflammation of your airways. Keep track of your asthma symptoms for several weeks, detailing all the environmental and emotional factors that are linked with your asthma. When you have an asthma attack, go back to your asthma diary to see which factor, or combination of factors, might have contributed to it. Once you know what these factors are, you can   take steps to control many of them. If you have allergies and asthma, it is important to take asthma prevention steps at home. Minimizing contact with the substance to which you are allergic will help prevent an asthma attack. Some triggers and ways to avoid these triggers are: °Animal Dander:  °Some people are allergic to the flakes of skin or dried saliva from animals with fur or feathers.  °· There is no such thing as a hypoallergenic dog or cat breed. All dogs or cats can cause allergies, even if they don't shed. °· Keep these pets out of your home. °· If you are not able to keep a pet  outdoors, keep the pet out of your bedroom and other sleeping areas at all times, and keep the door closed. °· Remove carpets and furniture covered with cloth from your home. If that is not possible, keep the pet away from fabric-covered furniture and carpets. °Dust Mites: °Many people with asthma are allergic to dust mites. Dust mites are tiny bugs that are found in every home in mattresses, pillows, carpets, fabric-covered furniture, bedcovers, clothes, stuffed toys, and other fabric-covered items.  °· Cover your mattress in a special dust-proof cover. °· Cover your pillow in a special dust-proof cover, or wash the pillow each week in hot water. Water must be hotter than 130° F (54.4° C) to kill dust mites. Cold or warm water used with detergent and bleach can also be effective. °· Wash the sheets and blankets on your bed each week in hot water. °· Try not to sleep or lie on cloth-covered cushions. °· Call ahead when traveling and ask for a smoke-free hotel room. Bring your own bedding and pillows in case the hotel only supplies feather pillows and down comforters, which may contain dust mites and cause asthma symptoms. °· Remove carpets from your bedroom and those laid on concrete, if you can. °· Keep stuffed toys out of the bed, or wash the toys weekly in hot water or cooler water with detergent and bleach. °Cockroaches: °Many people with asthma are allergic to the droppings and remains of cockroaches.  °· Keep food and garbage in closed containers. Never leave food out. °· Use poison baits, traps, powders, gels, or paste (for example, boric acid). °· If a spray is used to kill cockroaches, stay out of the room until the odor goes away. °Indoor Mold: °· Fix leaky faucets, pipes, or other sources of water that have mold around them. °· Clean floors and moldy surfaces with a fungicide or diluted bleach. °· Avoid using humidifiers, vaporizers, or swamp coolers. These can spread molds through the air. °Pollen and  Outdoor Mold: °· When pollen or mold spore counts are high, try to keep your windows closed. °· Stay indoors with windows closed from late morning to afternoon. Pollen and some mold spore counts are highest at that time. °· Ask your health care provider whether you need to take anti-inflammatory medicine or increase your dose of the medicine before your allergy season starts. °Other Irritants to Avoid: °· Tobacco smoke is an irritant. If you smoke, ask your health care provider how you can quit. Ask family members to quit smoking, too. Do not allow smoking in your home or car. °· If possible, do not use a wood-burning stove, kerosene heater, or fireplace. Minimize exposure to all sources of smoke, including incense, candles, fires, and fireworks. °· Try to stay away from strong odors and sprays, such as perfume, talcum powder, hair spray, and   paints.  Decrease humidity in your home and use an indoor air cleaning device. Reduce indoor humidity to below 60%. Dehumidifiers or central air conditioners can do this.  Decrease house dust exposure by changing furnace and air cooler filters frequently.  Try to have someone else vacuum for you once or twice a week. Stay out of rooms while they are being vacuumed and for a short while afterward.  If you vacuum, use a dust mask from a hardware store, a double-layered or microfilter vacuum cleaner bag, or a vacuum cleaner with a HEPA filter.  Sulfites in foods and beverages can be irritants. Do not drink beer or wine or eat dried fruit, processed potatoes, or shrimp if they cause asthma symptoms.  Cold air can trigger an asthma attack. Cover your nose and mouth with a scarf on cold or windy days.  Several health conditions can make asthma more difficult to manage, including a runny nose, sinus infections, reflux disease, psychological stress, and sleep apnea. Work with your health care provider to manage these conditions.  Avoid close contact with people who have  a respiratory infection such as a cold or the flu, since your asthma symptoms may get worse if you catch the infection. Wash your hands thoroughly after touching items that may have been handled by people with a respiratory infection.  Get a flu shot every year to protect against the flu virus, which often makes asthma worse for days or weeks. Also get a pneumonia shot if you have not previously had one. Unlike the flu shot, the pneumonia shot does not need to be given yearly. Medicines:  Talk to your health care provider about whether it is safe for you to take aspirin or non-steroidal anti-inflammatory medicines (NSAIDs). In a small number of people with asthma, aspirin and NSAIDs can cause asthma attacks. These medicines must be avoided by people who have known aspirin-sensitive asthma. It is important that people with aspirin-sensitive asthma read labels of all over-the-counter medicines used to treat pain, colds, coughs, and fever.  Beta-blockers and ACE inhibitors are other medicines you should discuss with your health care provider. HOW CAN I FIND OUT WHAT I AM ALLERGIC TO? Ask your asthma health care provider about allergy skin testing or blood testing (the RAST test) to identify the allergens to which you are sensitive. If you are found to have allergies, the most important thing to do is to try to avoid exposure to any allergens that you are sensitive to as much as possible. Other treatments for allergies, such as medicines and allergy shots (immunotherapy) are available.  CAN I EXERCISE? Follow your health care provider's advice regarding asthma treatment before exercising. It is important to maintain a regular exercise program, but vigorous exercise or exercise in cold, humid, or dry environments can cause asthma attacks, especially for those people who have exercise-induced asthma. Document Released: 07/07/2009 Document Revised: 07/24/2013 Document Reviewed: 01/24/2013 [facility] Patient  Information 2015 [place], [place]. This information is not intended to replace advice given to you by your health care provider. Make sure you discuss any questions you have with your health care provider. Iron Deficiency Anemia Anemia is a condition in which there are less red blood cells or hemoglobin in the blood than normal. Hemoglobin is the part of red blood cells that carries oxygen. Iron deficiency anemia is anemia caused by too little iron. It is the most common type of anemia. It may leave you tired and short of breath. CAUSES   Lack  of iron in the diet.  Poor absorption of iron, as seen with intestinal disorders.  Intestinal bleeding.  Heavy periods. SIGNS AND SYMPTOMS  Mild anemia may not be noticeable. Symptoms may include:  Fatigue.  Headache.  Pale skin.  Weakness.  Tiredness.  Shortness of breath.  Dizziness.  Cold hands and feet.  Fast or irregular heartbeat. DIAGNOSIS  Diagnosis requires a thorough evaluation and physical exam by your health care provider. Blood tests are generally used to confirm iron deficiency anemia. Additional tests may be done to find the underlying cause of your anemia. These may include:  Testing for blood in the stool (fecal occult blood test).  A procedure to see inside the colon and rectum (colonoscopy).  A procedure to see inside the esophagus and stomach (endoscopy). TREATMENT  Iron deficiency anemia is treated by correcting the cause of the deficiency. Treatment may involve:  Adding iron-rich foods to your diet.  Taking iron supplements. Pregnant or breastfeeding women need to take extra iron because their normal diet usually does not provide the required amount.  Taking vitamins. Vitamin C improves the absorption of iron. Your health care provider may recommend that you take your iron tablets with a glass of orange juice or vitamin C supplement.  Medicines to make heavy menstrual flow lighter.  Surgery. HOME CARE  INSTRUCTIONS   Take iron as directed by your health care provider.  If you cannot tolerate taking iron supplements by mouth, talk to your health care provider about taking them through a vein (intravenously) or an injection into a muscle.  For the best iron absorption, iron supplements should be taken on an empty stomach. If you cannot tolerate them on an empty stomach, you may need to take them with food.  Do not drink milk or take antacids at the same time as your iron supplements. Milk and antacids may interfere with the absorption of iron.  Iron supplements can cause constipation. Make sure to include fiber in your diet to prevent constipation. A stool softener may also be recommended.  Take vitamins as directed by your health care provider.  Eat a diet rich in iron. Foods high in iron include liver, lean beef, whole-grain bread, eggs, dried fruit, and dark green leafy vegetables. SEEK IMMEDIATE MEDICAL CARE IF:   You faint. If this happens, do not drive. Call your local emergency services (911 in [place] if no other help is available.  You have chest pain.  You feel nauseous or vomit.  You have severe or increased shortness of breath with activity.  You feel weak.  You have a rapid heartbeat.  You have unexplained sweating.  You become light-headed when getting up from a chair or bed. MAKE SURE YOU:   Understand these instructions.  Will watch your condition.  Will get help right away if you are not doing well or get worse. Document Released: 07/16/2000 Document Revised: 07/24/2013 Document Reviewed: 03/26/2013 [facility] Patient Information 2015 [place], [place]. This information is not intended to replace advice given to you by your health care provider. Make sure you discuss any questions you have with your health care provider.

## 2015-03-04 DIAGNOSIS — J45901 Unspecified asthma with (acute) exacerbation: Secondary | ICD-10-CM | POA: Diagnosis not present

## 2015-04-28 ENCOUNTER — Encounter: Payer: Self-pay | Admitting: Internal Medicine

## 2015-04-28 ENCOUNTER — Encounter: Payer: Commercial Managed Care - HMO | Admitting: Internal Medicine

## 2015-04-28 ENCOUNTER — Telehealth: Payer: Self-pay | Admitting: Internal Medicine

## 2015-04-28 NOTE — Telephone Encounter (Signed)
PATIENT REQUESTING CHICKEN POX VACCINE FROM [facility] ON [address].

## 2015-04-29 ENCOUNTER — Emergency Department (HOSPITAL_COMMUNITY)
Admission: EM | Admit: 2015-04-29 | Discharge: 2015-04-29 | Disposition: A | Discharge status: Home / Self Care | Payer: Commercial Managed Care - HMO | Attending: Emergency Medicine | Admitting: Emergency Medicine

## 2015-04-29 ENCOUNTER — Encounter (HOSPITAL_COMMUNITY): Payer: Self-pay | Admitting: *Deleted

## 2015-04-29 DIAGNOSIS — J4521 Mild intermittent asthma with (acute) exacerbation: Secondary | ICD-10-CM | POA: Insufficient documentation

## 2015-04-29 DIAGNOSIS — R05 Cough: Secondary | ICD-10-CM | POA: Diagnosis present

## 2015-04-29 DIAGNOSIS — D509 Iron deficiency anemia, unspecified: Secondary | ICD-10-CM | POA: Insufficient documentation

## 2015-04-29 DIAGNOSIS — E785 Hyperlipidemia, unspecified: Secondary | ICD-10-CM | POA: Diagnosis not present

## 2015-04-29 DIAGNOSIS — E559 Vitamin D deficiency, unspecified: Secondary | ICD-10-CM | POA: Insufficient documentation

## 2015-04-29 DIAGNOSIS — Z79899 Other long term (current) drug therapy: Secondary | ICD-10-CM | POA: Diagnosis not present

## 2015-04-29 DIAGNOSIS — R1110 Vomiting, unspecified: Secondary | ICD-10-CM | POA: Insufficient documentation

## 2015-04-29 DIAGNOSIS — R51 Headache: Secondary | ICD-10-CM | POA: Insufficient documentation

## 2015-04-29 DIAGNOSIS — R519 Headache, unspecified: Secondary | ICD-10-CM

## 2015-04-29 DIAGNOSIS — J45901 Unspecified asthma with (acute) exacerbation: Secondary | ICD-10-CM

## 2015-04-29 MED ORDER — PREDNISONE 10 MG (21) PO TBPK: 10.0000 mg | ORAL_TABLET | Freq: Every day | ORAL | Status: DC

## 2015-04-29 MED ORDER — HYDROCODONE-HOMATROPINE 5-1.5 MG/5ML PO SYRP: 5.0000 mL | ORAL_SOLUTION | Freq: Four times a day (QID) | ORAL | Status: DC | PRN

## 2015-04-29 MED ORDER — IBUPROFEN 800 MG PO TABS
800.0000 mg | ORAL_TABLET | Freq: Once | ORAL | Status: AC
  Administered 2015-04-29: 800 mg via ORAL
  Filled 2015-04-29: qty 1

## 2015-04-29 MED ORDER — ALBUTEROL SULFATE (2.5 MG/3ML) 0.083% IN NEBU
2.5000 mg | INHALATION_SOLUTION | Freq: Once | RESPIRATORY_TRACT | Status: AC
  Administered 2015-04-29: 2.5 mg via RESPIRATORY_TRACT
  Filled 2015-04-29: qty 3

## 2015-04-29 MED ORDER — HYDROCODONE-HOMATROPINE 5-1.5 MG/5ML PO SYRP
5.0000 mL | ORAL_SOLUTION | Freq: Once | ORAL | Status: AC
  Administered 2015-04-29: 5 mL via ORAL
  Filled 2015-04-29: qty 5

## 2015-04-29 NOTE — ED Notes (Signed)
Per [facility], cough began x2 days ago, vomiting x 2 today, c/o chest hurting from coughing, awoke this morning with h/a.

## 2015-04-29 NOTE — ED Provider Notes (Signed)
CSN: [id number]     Arrival date & time 04/29/15  1547 History  By signing my name below, I, Stephania Fragmin, attest that this documentation has been prepared under the direction and in the presence of [employer], PA-C. Electronically Signed: Stephania Fragmin, ED Scribe. 04/29/2015. 5:00 PM.    Chief Complaint  Patient presents with  . Cough  . Headache   The history is provided by the patient. No language interpreter was used.    HPI Comments: Suzanne Lewis is a 34 y.o. female with a history of mild intermittent asthma, who presents to the Emergency Department complaining of a cough productive with sputum that began 2 days ago and a 5/10 frontal headache that began today. She denies any treatment for the headache. She also complains of 2 episodes of post-tussive vomiting that occurred today, as well as mild chest tightness. Patient used Albuterol from her home inhaler 2-3 times today. She denies taking anything else for her symptoms. She denies a history of frequent headaches. She also denies a history of smoking.  She denies any vision changes, sore throat, fever, rhinorrhea, or nasal congestion.   Past Medical History  Diagnosis Date  . Mild intermittent asthma   . Allergic rhinitis   . Morbid obesity   . Iron deficiency anemia   . Hyperlipidemia   . Vitamin D deficiency    Past Surgical History  Procedure Laterality Date  . Cesarean section  2004   Family History  Problem Relation Age of Onset  . Diabetes Mother     died at age 30  . Heart failure Mother    Social History  Substance Use Topics  . Smoking status: Never Smoker   . Smokeless tobacco: None  . Alcohol Use: No   OB History    No data available     Review of Systems  Constitutional: Negative for fever.  HENT: Negative for congestion, rhinorrhea and sore throat.   Respiratory: Positive for cough and chest tightness.   Gastrointestinal: Positive for vomiting (post-tussive).  Neurological: Positive for  headaches.    Allergies  Review of patient's allergies indicates no known allergies.  Home Medications   Prior to Admission medications   Medication Sig Start Date End Date Taking? Authorizing Provider  albuterol (PROVENTIL HFA;VENTOLIN HFA) 108 (90 BASE) MCG/ACT inhaler Inhale 2 puffs into the lungs every 6 (six) hours as needed for wheezing or shortness of breath. 03/03/15  Yes Waynetta Pean, PA-C  ferrous sulfate 325 (65 FE) MG tablet Take 1 tablet (325 mg total) by mouth daily. 02/09/15 02/09/16 Yes Marjan Rabbani, MD  HYDROcodone-homatropine (HYCODAN) 5-1.5 MG/5ML syrup Take 5 mLs by mouth every 6 (six) hours as needed for cough. 04/29/15   Hanna Patel-Mills, PA-C  hydrocortisone cream 1 % Apply to affected area 2 times daily as needed Patient not taking: Reported on 04/29/2015 02/07/15 02/07/16  Juluis Mire, MD  predniSONE (STERAPRED UNI-PAK 21 TAB) 10 MG (21) TBPK tablet Take 1 tablet (10 mg total) by mouth daily. 04/29/15   Hanna Patel-Mills, PA-C  Vitamin D, Ergocalciferol, (DRISDOL) 50000 UNITS CAPS capsule Take 1 capsule (50,000 Units total) by mouth every 7 (seven) days. Patient not taking: Reported on 04/29/2015 02/09/15   Marjan Rabbani, MD   BP 106/60 mmHg  Pulse 97  Temp(Src) 98.2 F (36.8 C) (Oral)  Resp 18  SpO2 98%  LMP 04/22/2015 Physical Exam  Constitutional: She is oriented to person, place, and time. She appears well-developed and well-nourished. No distress.  Morbidly obese.  HENT:  Head: Normocephalic and atraumatic.  Eyes: Conjunctivae and EOM are normal.  EOMI. PERRLA.   Neck: Normal range of motion and full passive range of motion without pain. Neck supple. No rigidity. No tracheal deviation and normal range of motion present. No Brudzinski's sign and no Kernig's sign noted.  She has full range of motion of her neck.  Cardiovascular: Normal rate.   Pulmonary/Chest: Effort normal and breath sounds normal. No respiratory distress. She has no wheezes. She has no  rales.  Lungs are clear to auscultation bilaterally. No wheezing or decreased breath sounds.   Musculoskeletal: Normal range of motion.  Neurological: She is alert and oriented to person, place, and time.  Skin: Skin is warm and dry.  Psychiatric: She has a normal mood and affect. Her behavior is normal.  Nursing note and vitals reviewed.   ED Course  Procedures (including critical care time)  DIAGNOSTIC STUDIES: Oxygen Saturation is 98% on RA, normal by my interpretation.    COORDINATION OF CARE: 4:37 PM - Discussed treatment plan with pt at bedside which includes breathing treatment and pain-relieving medication administered here. Pt verbalized understanding and agreed to plan.   Labs Review Labs Reviewed - No data to display  Imaging Review No results found.   EKG Interpretation None      MDM   Final diagnoses:  Asthma exacerbation  Acute nonintractable headache, unspecified headache type   Patient presents for cough and headache. She has a history of asthma. She is well appearing and vital signs are stable. 99% oxygen on room air. No signs of respiratory distress. She has no signs of meningitis, SAH, or temporal arteritis. Medications  HYDROcodone-homatropine (HYCODAN) 5-1.5 MG/5ML syrup 5 mL (5 mLs Oral Given 04/29/15 1656)  albuterol (PROVENTIL) (2.5 MG/3ML) 0.083% nebulizer solution 2.5 mg (2.5 mg Nebulization Given 04/29/15 1646)  ibuprofen (ADVIL,MOTRIN) tablet 800 mg (800 mg Oral Given 04/29/15 1646)  Recheck: Patient states she feels much better after albuterol treatment. Patient has albuterol inhaler at home. I have given her prednisone and Hycodan. I discussed return precautions as well as follow-up and she verbally agrees with the plan.  I personally performed the services described in this documentation, which was scribed in my presence. The recorded information has been reviewed and is accurate.     Ottie Glazier, PA-C 04/29/15 [place],  MD 05/01/15 1504

## 2015-04-29 NOTE — Discharge Instructions (Signed)
Asthma, Acute Bronchospasm Follow-up with a primary care provider using the resource guide below. Return for fever or difficulty breathing. Acute bronchospasm caused by asthma is also referred to as an asthma attack. Bronchospasm means your air passages become narrowed. The narrowing is caused by inflammation and tightening of the muscles in the air tubes (bronchi) in your lungs. This can make it hard to breathe or cause you to wheeze and cough. CAUSES Possible triggers are:  Animal dander from the skin, hair, or feathers of animals.  Dust mites contained in house dust.  Cockroaches.  Pollen from trees or grass.  Mold.  Cigarette or tobacco smoke.  Air pollutants such as dust, household cleaners, hair sprays, aerosol sprays, paint fumes, strong chemicals, or strong odors.  Cold air or weather changes. Cold air may trigger inflammation. Winds increase molds and pollens in the air.  Strong emotions such as crying or laughing hard.  Stress.  Certain medicines such as aspirin or beta-blockers.  Sulfites in foods and drinks, such as dried fruits and wine.  Infections or inflammatory conditions, such as a flu, cold, or inflammation of the nasal membranes (rhinitis).  Gastroesophageal reflux disease (GERD). GERD is a condition where stomach acid backs up into your esophagus.  Exercise or strenuous activity. SIGNS AND SYMPTOMS   Wheezing.  Excessive coughing, particularly at night.  Chest tightness.  Shortness of breath. DIAGNOSIS  Your health care provider will ask you about your medical history and perform a physical exam. A chest X-ray or blood testing may be performed to look for other causes of your symptoms or other conditions that may have triggered your asthma attack. TREATMENT  Treatment is aimed at reducing inflammation and opening up the airways in your lungs. Most asthma attacks are treated with inhaled medicines. These include quick relief or rescue medicines  (such as bronchodilators) and controller medicines (such as inhaled corticosteroids). These medicines are sometimes given through an inhaler or a nebulizer. Systemic steroid medicine taken by mouth or given through an IV tube also can be used to reduce the inflammation when an attack is moderate or severe. Antibiotic medicines are only used if a bacterial infection is present.  HOME CARE INSTRUCTIONS   Rest.  Drink plenty of liquids. This helps the mucus to remain thin and be easily coughed up. Only use caffeine in moderation and do not use alcohol until you have recovered from your illness.  Do not smoke. Avoid being exposed to secondhand smoke.  You play a critical role in keeping yourself in good health. Avoid exposure to things that cause you to wheeze or to have breathing problems.  Keep your medicines up-to-date and available. Carefully follow your health care provider's treatment plan.  Take your medicine exactly as prescribed.  When pollen or pollution is bad, keep windows closed and use an air conditioner or go to places with air conditioning.  Asthma requires careful medical care. See your health care provider for a follow-up as advised. If you are more than [redacted] weeks pregnant and you were prescribed any new medicines, let your obstetrician know about the visit and how you are doing. Follow up with your health care provider as directed.  After you have recovered from your asthma attack, make an appointment with your outpatient doctor to talk about ways to reduce the likelihood of future attacks. If you do not have a doctor who manages your asthma, make an appointment with a primary care doctor to discuss your asthma. [place]  IF:   You are getting worse.  You have trouble breathing. If severe, call your local emergency services (911 in the U.S.).  You develop chest pain or discomfort.  You are vomiting.  You are not able to keep fluids down.  You are  coughing up yellow, green, brown, or bloody sputum.  You have a fever and your symptoms suddenly get worse.  You have trouble swallowing. MAKE SURE YOU:   Understand these instructions.  Will watch your condition.  Will get help right away if you are not doing well or get worse. Document Released: 11/03/2006 Document Revised: 07/24/2013 Document Reviewed: 01/24/2013 [facility] Patient Information 2015 [place], [place]. This information is not intended to replace advice given to you by your health care provider. Make sure you discuss any questions you have with your health care provider.  Emergency Department Resource Guide 1) Find a Doctor and Pay Out of Pocket Although you won't have to find out who is covered by your insurance plan, it is a good idea to ask around and get recommendations. You will then need to call the office and see if the doctor you have chosen will accept you as a new patient and what types of options they offer for patients who are self-pay. Some doctors offer discounts or will set up payment plans for their patients who do not have insurance, but you will need to ask so you aren't surprised when you get to your appointment.  2) Contact Your Local Health Department Not all health departments have doctors that can see patients for sick visits, but many do, so it is worth a call to see if yours does. If you don't know where your local health department is, you can check in your phone book. The CDC also has a tool to help you locate your state's health department, and many state websites also have listings of all of their local health departments.  3) Find a [facility] If your illness is not likely to be very severe or complicated, you may want to try a walk in clinic. These are popping up all over the country in pharmacies, drugstores, and shopping centers. They're usually staffed by nurse practitioners or physician assistants that have been trained to treat common  illnesses and complaints. They're usually fairly quick and inexpensive. However, if you have serious medical issues or chronic medical problems, these are probably not your best option.  No Primary Care Doctor: - Call Health Connect at  [phone] - they can help you locate a primary care doctor that  accepts your insurance, provides certain services, etc. - Physician Referral Service- [phone]  Chronic Pain Problems: Organization         Address  Phone   Notes  [facility]  [phone] Patients need to be referred by their primary care doctor.   Medication Assistance: Organization         Address  Phone   Notes  [facility] Medication [facility] [address] [place], [place] [address] [phone] --Must be a resident of [facility] -- Must have NO insurance coverage whatsoever (no Medicaid/ Medicare, etc.) -- The pt. MUST have a primary care doctor that directs their care regularly and follows them in the community   MedAssist  [phone]   [employer]  [phone]    Agencies that provide inexpensive medical care: Organization         Address  Phone   Notes  [facility]  [phone]   [facility]    [phone]   [facility] [place], [place] [address] [phone]   [place] [address] [place] [address] [place] [phone]   Planned Parenthood    [phone]   [facility]    [phone]   [place] and [place] [address], [place] Phone:  [phone], Fax:  [phone] Hours of Operation:  9 am - 6 pm, M-F.  Also accepts Medicaid/Medicare and self-pay.  [facility] for [place], [address], [place] Phone: [phone], Fax: [phone]. Hours of Operation:  8:30 am - 5:30 pm, M-F.  Also accepts Medicaid and self-pay.  [facility] [place]  [address], [place] Phone: [phone]   [place], [place], [place] [phone], Ext. [phone] Mondays & Thursdays: 7-9 AM.  First 15 patients are seen on a first come, first serve basis.    Supreme Providers:  Organization         Address  Phone   Notes  [facility] [address], [place] [phone] Also accepts self-pay patients.  [facility] [address] [place], [place]  [phone]   [place], [address], [place] [phone]   [facility] [address], [place] [phone]   Lucianne Lei [address], [place]   [phone] Only accepts [place] Access [place] patients after they have their name applied to their card.   Self-Pay (no insurance) in [facility]):  Organization         Address  Phone   Notes  Sickle Cell Patients, [facility] Internal Medicine [place] [phone]   [facility] Urgent Care [place] [phone]   [facility] [place]  [place], [place], [place] [phone]   [facility]/Dr. Osei-Bonsu  [address] [place] or [address], [place] [phone] Phone number for both [place] and [place] locations is the same.  Urgent Medical and [facility] [address], [place] [phone]   [facility] [address], [place] or [address] [phone]   [facility] [place] [address] [place] [phone], phone; [phone], fax Sees patients 1st and 3rd Saturday of every month.  Must not qualify for public or private insurance (i.e. Medicaid, Medicare, [place] Health Choice, Veterans' Benefits)  Household income should be no more than 200% of the  poverty level The clinic cannot treat you if you are pregnant or think you are pregnant  Sexually transmitted diseases are not treated at the clinic.    Dental Care: Organization         Address  Phone  Notes  [facility] of [facility] [place] [phone] Accepts children up to age 36 who are enrolled in [place] or [place]; pregnant women with a Medicaid card; and children who have applied for Medicaid or  Health Choice, but were declined, whose parents can pay a reduced fee at time of service.  [facility] Department of  [facility]  [address], [place] [phone] Accepts children up to age 32 who are enrolled in Medicaid or [place]; pregnant women with a Medicaid card; and children who have applied for Medicaid or [place] Health Choice, but were declined, whose parents can pay a reduced fee at time of service.  [facility] PRO[facility]  [place] [phone] Patients are seen by appointment only. Walk-ins are not accepted. [place] will see patients 32 years of age and older. Monday - Tuesday (8am-5pm) Most Wednesdays (8:30-5pm) $30 per visit, cash only  [facility] Adult Dental Access PROGRAM  [address], [facility] [phone] Patients are seen by appointment only. Walk-ins are not accepted. [place] will see patients 82 years of age and older. One Wednesday Evening (Monthly: Volunteer Based).  $30 per visit, cash only  [place]  [phone] for adults; Children under age 43, call Graduate Pediatric Dentistry at [phone]. Children aged 37-14, please call [phone] to request a pediatric application.  Dental services are provided in all areas of dental care including fillings, crowns and bridges, complete and partial dentures, implants, gum treatment, root canals, and extractions.  Preventive care is also provided. Treatment is provided to both adults and children. Patients are selected via a lottery and there is often a waiting list.   [facility] [address], [place]  [phone] [email or web address]   [facility] [address] [place], [place] [phone], Ext. [phone] Second and Fourth Thursday of each month, opens at 6:30 AM; Clinic ends at 9 AM.  Patients are seen on a first-come first-served basis, and a limited number are seen during each clinic.   [facility]  [address] Hillard Danker [place], [place] [phone]   Eligibility Requirements You must have lived in [place], [place], or [place] counties for at least the last three months.   You cannot be eligible for state or federal sponsored [employer], including [employer], [place], or [employer].   You generally cannot be eligible for healthcare insurance through your employer.    How to apply: Eligibility screenings are held every Tuesday and Wednesday afternoon from 1:00 pm until 4:00 pm. You do not need an appointment for the interview!  [facility] [address] [place], [place]   [place]  [place] Department  [place]  [phone]    Behavioral Health Resources in the Community: Intensive Outpatient Programs Organization         Address  Phone  Notes  [place] Winfred. [address], [place], [place] [phone]   [facility] Outpatient [address] [place], [place]   ADS: Alcohol & Drug S[facility]cs [address] [place], [place]   [place] [address]  [place], [place] or [phone]   Substance Abuse Resources Organization         Address  Phone  Notes  Alcohol and Drug Services  [phone]   [place]  [phone]   [facility]   Chinita Pester  [phone]   Residential & Outpatient Substance Abuse Program  [phone]   Psychological Services Organization         Address  Phone  Notes  [place]  [place]   [place] [address] [place] or [phone]    Mobile Crisis Teams Organization         Address  Phone  Notes  [facility]atives, Mobile Crisis Care Unit  [phone]   [facility]  [address] [place], [place]   [place] Levels [address] [place] [phone]    Self-Help/Support Groups Organization         Address  Phone             Notes  [place]. of [place] - variety of support groups  [place] Call for more information  Narcotics Anonymous (NA), [facility] [address], [employer] [place]  2 meetings at this location   Special educational needs teacher         Address  Phone  Notes  [facility]idential Treatment [place],    [place]  [phone]   [facility]  [address], [place] [phone], [place], [place]   [place], [place] [phone] Admissions: 8am-3pm M-F  Incentives Substance [place] [address],    [place], [place] [phone]   [facility] [address] [place], [place], [place]   [facility] [address],  [place], [place]   [facility] Programs - Intensive Outpatient [address], [place], [place]   [facility] ([place].,  [place], [place] [phone] or [phone]   [facility] Services (RTS) [address], [place], [place] Accepts Medicaid  [facility] [place] [address],  [place]  [phone] Substance Abuse/Addiction Treatment   [facility] Organization         Address  Phone  Notes  [facility]  [phone]   Domenic Schwab, PhD [address] Arlis Porta [place], [place]   [phone] or [phone]   [place], [place] [phone]   [facility] [address], [place], [place] [phone] Insurance/Medicaid/sponsorship through [facility] and Families [address]                                    [place], [place] [phone] [place] [address], [place] [phone]    Dr. Adele Schilder  [phone]   [facility] Dept. 1) [address] [place] 2) [place] 3)  [place] [address], [place] [phone]  [phone]   [place] [phone] or [phone] (After Hours)

## 2015-05-05 ENCOUNTER — Encounter: Payer: Self-pay | Admitting: Internal Medicine

## 2015-05-05 ENCOUNTER — Ambulatory Visit (INDEPENDENT_AMBULATORY_CARE_PROVIDER_SITE_OTHER): Payer: Commercial Managed Care - HMO | Admitting: Internal Medicine

## 2015-05-05 VITALS — BP 155/100 | HR 90 | Temp 98.0°F | Ht 59.0 in | Wt 311.2 lb

## 2015-05-05 DIAGNOSIS — Z23 Encounter for immunization: Secondary | ICD-10-CM

## 2015-05-05 DIAGNOSIS — D473 Essential (hemorrhagic) thrombocythemia: Secondary | ICD-10-CM | POA: Diagnosis not present

## 2015-05-05 DIAGNOSIS — Z6841 Body Mass Index (BMI) 40.0 and over, adult: Secondary | ICD-10-CM

## 2015-05-05 DIAGNOSIS — D509 Iron deficiency anemia, unspecified: Secondary | ICD-10-CM

## 2015-05-05 DIAGNOSIS — E559 Vitamin D deficiency, unspecified: Secondary | ICD-10-CM | POA: Diagnosis not present

## 2015-05-05 NOTE — Progress Notes (Signed)
   Subjective:    Patient ID: Sherre Scarlet, female    DOB: 1981-02-21, 34 y.o.   MRN: [id number]  HPI   Ms. Marton is a 34 year old female with obesity, mild intermittent asthma, iron deficiency anemia, vitamin D deficiency who presents today for follow-up visit with her fianc. She received her flu immunization today.Please see assessment & plan for documentation of chronic medical problems.    Review of Systems  Respiratory: Negative for shortness of breath.   Cardiovascular: Negative for chest pain and leg swelling.  Gastrointestinal: Negative for nausea, vomiting, abdominal pain, diarrhea and blood in stool.  Genitourinary: Negative for dysuria.  Neurological: Negative for dizziness.       Objective:   Physical Exam Constitutional: Young obese African-American female. No distress.  Head: Normocephalic and atraumatic.  Eyes: Conjunctivae are normal. Pupils are 4 mm, direct, consensual, near.  Cardiovascular: Normal rate, regular rhythm and normal heart sounds.  No gallop, friction rub, murmur heard. Pulmonary/Chest: Effort normal. No respiratory distress. No wheezes, rales.  Abdominal: Soft, obese abdomen. Bowel sounds are normal. No distension. No tenderness.  Neurological: Alert and oriented to person, place, and time. Coordination normal.  Skin: Warm and dry. Not diaphoretic.  Psychiatric: Affect appropriate         Assessment & Plan:

## 2015-05-05 NOTE — Assessment & Plan Note (Addendum)
She reports completing her supplementation, so we will recheck her vitamin D level today.  ADDENDUM 05/10/2015  10:47 PM:  Vitamin D improved from 6 to 23.5 but may not need additional supplementation due to conflicting guidelines.

## 2015-05-05 NOTE — Patient Instructions (Signed)
Thank you for bringing your medicines today. This helps [place] keep you safe from mistakes.  We have referred you to the health coach in our clinic who can assist with your weight loss efforts.  We will recheck her blood counts and vitamin D levels today and call you with results.  We look forward to seeing you back in 4 weeks.

## 2015-05-05 NOTE — Assessment & Plan Note (Addendum)
She is tolerating her iron supplementation well. She denies any active source of bleeding and reports that her periods tend to last 4-5 days and occur every 30 days. She goes through 6-7 pads. She is encouraged to follow-up with OB/GYN since the degree of her menstrual bleeding has resulted in iron deficiency.  ADDENDUM 05/10/2015  10:47 PM:  Ferritin, Hb improved. 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

## 2015-05-05 NOTE — Assessment & Plan Note (Signed)
Her weight today of 311 pounds is unchanged from her most recent appointment back in July. For her diet, she eats a variety of foods including veggies [steamed], and grilled foods; she attempts to prepare a list of her meals. She also walks 30-40 minutes a day and has avoided sodas though drinks half a glass 1-2 of sweet tea per day. I encouraged her to continue her efforts and told her to continue working on cutting out the sweet tea in her diet. She is also agreeable to following up with our clinic health coach for more tips on how to lose weight.

## 2015-05-06 LAB — CBC
HEMOGLOBIN: 11.3 g/dL (ref 11.1–15.9)
Hematocrit: 36.3 % (ref 34.0–46.6)
MCH: 25 pg — AB (ref 26.6–33.0)
MCHC: 31.1 g/dL — ABNORMAL LOW (ref 31.5–35.7)
MCV: 80 fL (ref 79–97)
PLATELETS: 621 10*3/uL — AB (ref 150–379)
RBC: 4.52 x10E6/uL (ref 3.77–5.28)
RDW: 17.8 % — ABNORMAL HIGH (ref 12.3–15.4)
WBC: 10.9 10*3/uL — AB (ref 3.4–10.8)

## 2015-05-06 LAB — IRON AND TIBC
IRON SATURATION: 5 % — AB (ref 15–55)
IRON: 16 ug/dL — AB (ref 27–159)
TIBC: 344 ug/dL (ref 250–450)
UIBC: 328 ug/dL (ref 131–425)

## 2015-05-06 LAB — FERRITIN: Ferritin: 22 ng/mL (ref 15–150)

## 2015-05-06 LAB — VITAMIN D 25 HYDROXY (VIT D DEFICIENCY, FRACTURES): VIT D 25 HYDROXY: 23.5 ng/mL — AB (ref 30.0–100.0)

## 2015-05-16 NOTE — Telephone Encounter (Signed)
Was seen by Dr Alfonso Patten. Suzanne Lewis 05/05/15 in clinic.

## 2015-05-22 ENCOUNTER — Ambulatory Visit (INDEPENDENT_AMBULATORY_CARE_PROVIDER_SITE_OTHER): Payer: Commercial Managed Care - HMO | Admitting: Dietician

## 2015-05-22 ENCOUNTER — Encounter: Payer: Self-pay | Admitting: Dietician

## 2015-05-22 ENCOUNTER — Ambulatory Visit: Payer: Commercial Managed Care - HMO | Admitting: Dietician

## 2015-05-22 DIAGNOSIS — Z713 Dietary counseling and surveillance: Secondary | ICD-10-CM | POA: Diagnosis not present

## 2015-05-22 DIAGNOSIS — Z6841 Body Mass Index (BMI) 40.0 and over, adult: Secondary | ICD-10-CM | POA: Diagnosis not present

## 2015-05-22 NOTE — Patient Instructions (Addendum)
Please make a follow up appointment for 3-4 weeks   You need 1,750 Calories/day to lose 2 lb per week.  Breakfast 500  Lunch 500 Dinner 500 Snacks 250  Use your meal plan and the book provided to eat the right portion at each meal to total 1750 calories each day.

## 2015-05-22 NOTE — Progress Notes (Signed)
Medical Nutrition Therapy:  Appt start time: 1100 end time:  1200. Initial Visit in series of 3-4 visits  Assessment:  Primary concerns today: weight loss.  Patient is the wife of a patient who has diabetes and she took care of her mother with diabetes, so she says she knows a lot about meal planning. She has ben walking 30 minutes a day and the rest of the day watches TV and cares for her 34 year old daughter. A family member cooks her meals. She is accompanied today by her husband and his mother. She had difficulty with addition although she was able to read the numbers on the food labels. Preferred Learning Style: No preference indicated  Learning Readiness: Contemplating  WEIGHT HISTORY: says she cannot remember being a different weight, but did diet one time and lost 40# ANTHROPOMETRICS: BMI 62.7, weight- 307.7#, goals are 150-200 # in 1 year, so 2#/week weight loss  SLEEP: 10-11 Pm to 5:30 AM, she reports sleeps through the night most nights MEDICATIONS: Ferrous sulfate daily DIETARY INTAKE: Usual eating pattern includes 2-3 meals and 1-2 snacks per day. Everyday foods include almond milk, meat, popcorn.  Avoided foods include milk   24-hr recall:  B ( 8 AM): bowl of cornflakes with almond milk, sometimes fruit, water L ( PM): skips sometimes and other days has sandwich, water Snk ( PM): fruit D ( 5:30 PM): grilled chicken, baked beans, collards and water Snk ( PM): popcorn, peanut butter crackers Beverages: unsweet Kool Aid, water  Usual physical activity: sedentary most of day except for 30 minute walk  Estimated daily energy needs for weight loss: 1750  Calories for 2 #/week weight loss, 2200 for 1# weight loss/week  Progress Towards Goal(s):  In progress.   Nutritional Diagnosis:  NB-1.1 Food and nutrition-related knowledge deficit As related to lack of sufficient meal planning training to know how to follow a meal plan.  As evidenced by her difficulty today grasping the  concept of how to use a meal plan. .    Intervention:  Nutrition education about weight loss, food groups and 1700-1800 calorie meal plan emphasizing higher fiber and quality food Coordination of care: try to coordinate group support for following a meal plan  Teaching Method Utilized: Visual,  Auditory, Hands on Handouts given during visit include:yes Barriers to learning/adherence to lifestyle change: social support, transparency Demonstrated degree of understanding via:  Teach Back  Monitoring/Evaluation:  Dietary intake, exercise, and body weight in 3 week(s).  You need 2,250 Calories/day to lose 1 lb per week.  You need 1,750 Calories/day to lose 2 lb per week.

## 2015-05-26 NOTE — Addendum Note (Signed)
Addended by: Hulan Fray on: 05/26/2015 09:55 PM   Modules accepted: Orders

## 2015-06-09 ENCOUNTER — Ambulatory Visit (INDEPENDENT_AMBULATORY_CARE_PROVIDER_SITE_OTHER): Payer: Medicare Other | Admitting: Internal Medicine

## 2015-06-09 ENCOUNTER — Encounter: Payer: Self-pay | Admitting: Internal Medicine

## 2015-06-09 VITALS — BP 150/92 | HR 94 | Temp 98.0°F | Wt 313.0 lb

## 2015-06-09 DIAGNOSIS — H9201 Otalgia, right ear: Secondary | ICD-10-CM | POA: Diagnosis not present

## 2015-06-09 DIAGNOSIS — D509 Iron deficiency anemia, unspecified: Secondary | ICD-10-CM

## 2015-06-09 DIAGNOSIS — L309 Dermatitis, unspecified: Secondary | ICD-10-CM

## 2015-06-09 MED ORDER — CARBAMIDE PEROXIDE 6.5 % OT SOLN: 5.0000 [drp] | Freq: Two times a day (BID) | OTIC | Status: DC

## 2015-06-09 MED ORDER — FERROUS SULFATE 325 (65 FE) MG PO TABS: 325.0000 mg | ORAL_TABLET | Freq: Every day | ORAL | Status: DC

## 2015-06-09 MED ORDER — HYDROCODONE-HOMATROPINE 5-1.5 MG/5ML PO SYRP: 5.0000 mL | ORAL_SOLUTION | Freq: Four times a day (QID) | ORAL | Status: DC | PRN

## 2015-06-09 MED ORDER — ALBUTEROL SULFATE HFA 108 (90 BASE) MCG/ACT IN AERS: 2.0000 | INHALATION_SPRAY | Freq: Four times a day (QID) | RESPIRATORY_TRACT | Status: DC | PRN

## 2015-06-09 MED ORDER — HYDROCORTISONE 1 % EX CREA: TOPICAL_CREAM | CUTANEOUS | Status: DC

## 2015-06-09 NOTE — Patient Instructions (Addendum)
Mrs. Udovich,  Please put 5 drops of the debrox drops in your right ear twice daily. Let us know if this helps over the next week or so. If not, give [place] a call and we will re-assess. I hope you're feeling better!  Thanks, Blane Ohara

## 2015-06-09 NOTE — Assessment & Plan Note (Signed)
Ear pain x 2 days with some discharge appearing cerumen-like, otherwise no other symptoms suggestive of an AOM or AOE. No foreign body seen on exam, otherwise clear TMs. -Prescribe Debrox drops  -Instructed to notify our clinic in the next 1-2 weeks if symptoms don't improve, can consider AOE vs mass vs AOM otherwise

## 2015-06-09 NOTE — Progress Notes (Signed)
Internal Medicine Clinic Attending  I saw and evaluated the patient.  I personally confirmed the key portions of the history and exam documented by Dr. Kennedy and I reviewed pertinent patient test results.  The assessment, diagnosis, and plan were formulated together and I agree with the documentation in the resident's note.  

## 2015-06-09 NOTE — Progress Notes (Signed)
   Patient ID: Suzanne Lewis female   DOB: [birth year] 34 y.o.   MRN: [id number]  Subjective:   HPI: Suzanne Lewis is a 34 y.o. with PMH of obesity, asthma, and iron-deficiency anemia 2/2 menorrhagia who presents to [facility] today for evaluation of R ear pain. This has been going on for 2 days, is a sharp pain in the R ear that occurs primarily at night, with occasional brown/yellow discharge from the ear. She says she gets lightheaded when she gets up too quickly but otherwise denies any other symptoms such as fever, malaise, sore throat, rhinorrhea, cough, chest pain, nausea, vomiting, diarrhea, rashes, sick contacts, recent travel, or any other issues at this time.  Please see problem-based charting for status of medical issues pertinent to this visit.  Review of Systems: Pertinent items noted in HPI and remainder of comprehensive ROS otherwise negative.  Objective:  Physical Exam: Filed Vitals:   06/09/15 1350  BP: 150/92  Pulse: 94  Temp: 98 F (36.7 C)  TempSrc: Oral  Weight: 313 lb (141.976 kg)  SpO2: 100%   Gen: Well-appearing, alert and oriented to person, place, and time HEENT: Oropharynx clear without erythema or exudate. TMs clear bilaterally, canals without lesions or debris. No periauricular tenderness. Mild bilateral jugulodigastric lymphadenopathy. Neck: No cervical LAD, no thyromegaly or nodules, no JVD noted. CV: Normal rate, regular rhythm, no murmurs, rubs, or gallops Pulmonary: Normal effort, CTA bilaterally, no wheezing, rales, or rhonchi Abdominal: Soft, non-tender, non-distended, without rebound, guarding, or masses Extremities: Distal pulses 2+ in upper and lower extremities bilaterally, no tenderness, erythema or edema Skin: No atypical appearing moles. No rashes  Assessment & Plan:  Please see problem-based charting for assessment and plan.  Blane Ohara, MD Resident Physician, PGY-1 Department of Internal Medicine [facility]

## 2015-06-12 ENCOUNTER — Encounter: Payer: Self-pay | Admitting: Dietician

## 2015-06-12 ENCOUNTER — Ambulatory Visit (INDEPENDENT_AMBULATORY_CARE_PROVIDER_SITE_OTHER): Payer: Medicare Other | Admitting: Dietician

## 2015-06-12 NOTE — Patient Instructions (Addendum)
Track your steps daily by lookng at your app on your phone 3-4 in the afternoon, if yo haven' reached your goal  Jun 11 4999 steps a day Nov 18 change goal to 6000 steps a day Nov 25 change goal to 7000 steps a day Dec 1 change goal to 8000 steps a day Dec 8 change goal to 9000 steps a day Dec 15  10000 steps a day is you new goal  Track your calories using [employer]- see if you can find the scanner so you don't have to type so much

## 2015-06-12 NOTE — Progress Notes (Signed)
Medical Nutrition Therapy:  Appt start time: 830 end time:  [id number]. 2nd Visit in series of 3-4 visits  Assessment:  Primary concerns today: weight loss and diabetes prevention Patient is here with her husband and his mother. She says she has more energy since our last visit, and is able to walk a bit more. However, she does not think she has changed her food intake. She likes using her smartphone so we loaded food and activity trackers and discussed how to use them. It may benefit her to be able to scan labels.   Usual physical activity: sedentary most of day except for 30 minute walk Weight- increased 3 days ago to 313- she did not want to weigh today Estimated daily energy needs for weight loss: 1750  Calories for 2 #/week weight loss, 2200-2300 for 1# weight loss/week  Progress Towards Goal(s):  In progress.   Nutritional Diagnosis:  NB-1.1 Food and nutrition-related knowledge deficit As related to lack of sufficient meal planning training to know how to follow a meal plan.  As evidenced by her difficulty today grasping the concept of how to use a meal plan. . This is un changed today    Intervention:  Nutrition education about weight loss, how to track her steps and food in her smartphone. Coordination of care: try to coordinate group support for following a meal plan  Teaching Method Utilized: Visual,  Auditory, Hands on Handouts given during visit include:yes Barriers to learning/adherence to lifestyle change: social support, transparency Demonstrated degree of understanding via:  Teach Back  Monitoring/Evaluation:  Dietary intake, exercise, and body weight in 3 week(s).

## 2015-06-30 ENCOUNTER — Telehealth: Payer: Self-pay | Admitting: Licensed Clinical Social Worker

## 2015-06-30 NOTE — Telephone Encounter (Signed)
Suzanne Lewis was referred to CSW as provided CDE with a portion of the SCAT application.  Pt in need of completion of Part B.  CSW in need of signed ROI of Part B.  CSW placed called to pt.  CSW left message requesting return call. CSW provided contact hours and phone number.  CSW can either mail ROI or pt's has an appointment on 07/04/15, message left requesting pt's choice.

## 2015-07-04 ENCOUNTER — Ambulatory Visit (INDEPENDENT_AMBULATORY_CARE_PROVIDER_SITE_OTHER): Payer: Medicare Other | Admitting: Dietician
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
## 2015-07-04 NOTE — Patient Instructions (Signed)
your meal plan  3 balanced and portion controlled meals and 1 snack every day  Try weighing daily and write it down. on this sheet if fine and bring it back with you  Keep walking every day as much as you can- 2.5 miles/40-60 minutes

## 2015-07-04 NOTE — Telephone Encounter (Signed)
Part B signed, completed and faxed during pt's scheduled [facility] appointment.

## 2015-07-18 ENCOUNTER — Ambulatory Visit: Payer: Medicare Other | Admitting: Dietician

## 2015-11-06 ENCOUNTER — Encounter (HOSPITAL_COMMUNITY): Payer: Self-pay | Admitting: Emergency Medicine

## 2015-11-06 ENCOUNTER — Emergency Department (HOSPITAL_COMMUNITY): Payer: Medicare Other

## 2015-11-06 ENCOUNTER — Emergency Department (HOSPITAL_COMMUNITY)
Admission: EM | Admit: 2015-11-06 | Discharge: 2015-11-06 | Disposition: A | Discharge status: Home / Self Care | Payer: Medicare Other | Attending: Emergency Medicine | Admitting: Emergency Medicine

## 2015-11-06 DIAGNOSIS — J45901 Unspecified asthma with (acute) exacerbation: Secondary | ICD-10-CM | POA: Insufficient documentation

## 2015-11-06 DIAGNOSIS — H938X9 Other specified disorders of ear, unspecified ear: Secondary | ICD-10-CM | POA: Diagnosis not present

## 2015-11-06 DIAGNOSIS — Z79899 Other long term (current) drug therapy: Secondary | ICD-10-CM | POA: Insufficient documentation

## 2015-11-06 DIAGNOSIS — Z7952 Long term (current) use of systemic steroids: Secondary | ICD-10-CM | POA: Diagnosis not present

## 2015-11-06 DIAGNOSIS — R1110 Vomiting, unspecified: Secondary | ICD-10-CM | POA: Insufficient documentation

## 2015-11-06 DIAGNOSIS — R000 Tachycardia, unspecified: Secondary | ICD-10-CM | POA: Diagnosis not present

## 2015-11-06 DIAGNOSIS — R51 Headache: Secondary | ICD-10-CM | POA: Diagnosis not present

## 2015-11-06 DIAGNOSIS — D649 Anemia, unspecified: Secondary | ICD-10-CM | POA: Diagnosis not present

## 2015-11-06 DIAGNOSIS — R079 Chest pain, unspecified: Secondary | ICD-10-CM | POA: Diagnosis present

## 2015-11-06 DIAGNOSIS — R05 Cough: Secondary | ICD-10-CM | POA: Diagnosis not present

## 2015-11-06 LAB — BASIC METABOLIC PANEL
Anion gap: 14 (ref 5–15)
BUN: 9 mg/dL (ref 6–20)
CO2: 20 mmol/L — ABNORMAL LOW (ref 22–32)
CREATININE: 0.81 mg/dL (ref 0.44–1.00)
Calcium: 9.1 mg/dL (ref 8.9–10.3)
Chloride: 103 mmol/L (ref 101–111)
GFR calc Af Amer: >60 mL/min (ref >60)
GLUCOSE: 138 mg/dL — AB (ref 65–99)
POTASSIUM: 3.7 mmol/L (ref 3.5–5.1)
SODIUM: 137 mmol/L (ref 135–145)

## 2015-11-06 LAB — D-DIMER, QUANTITATIVE (NOT AT ARMC): D-Dimer, Quant: 0.4 ug/mL-FEU (ref 0.00–0.50)

## 2015-11-06 LAB — CBC
HEMATOCRIT: 35.8 % — AB (ref 36.0–46.0)
Hemoglobin: 11.2 g/dL — ABNORMAL LOW (ref 12.0–15.0)
MCH: 26.4 pg (ref 26.0–34.0)
MCHC: 31.3 g/dL (ref 30.0–36.0)
MCV: 84.2 fL (ref 78.0–100.0)
PLATELETS: 480 10*3/uL — AB (ref 150–400)
RBC: 4.25 MIL/uL (ref 3.87–5.11)
RDW: 13.8 % (ref 11.5–15.5)
WBC: 6.7 10*3/uL (ref 4.0–10.5)

## 2015-11-06 LAB — I-STAT TROPONIN, ED: TROPONIN I, POC: 0.01 ng/mL (ref 0.00–0.08)

## 2015-11-06 MED ORDER — IPRATROPIUM-ALBUTEROL 0.5-2.5 (3) MG/3ML IN SOLN
3.0000 mL | Freq: Once | RESPIRATORY_TRACT | Status: AC
  Administered 2015-11-06: 3 mL via RESPIRATORY_TRACT
  Filled 2015-11-06: qty 3

## 2015-11-06 MED ORDER — ACETAMINOPHEN-CODEINE 120-12 MG/5ML PO SOLN: 10.0000 mL | ORAL | Status: DC | PRN

## 2015-11-06 MED ORDER — PREDNISONE 50 MG PO TABS: 50.0000 mg | ORAL_TABLET | Freq: Every day | ORAL | Status: DC

## 2015-11-06 MED ORDER — GUAIFENESIN ER 1200 MG PO TB12: 1.0000 | ORAL_TABLET | Freq: Two times a day (BID) | ORAL | Status: DC

## 2015-11-06 NOTE — Discharge Instructions (Signed)
Return here as needed. Follow up with your PCP.  °

## 2015-11-06 NOTE — ED Provider Notes (Signed)
CSN: [id number]     Arrival date & time 11/06/15  0849 History   First MD Initiated Contact with Patient 11/06/15 0915     Chief Complaint  Patient presents with  . Chest Pain  . Emesis   HPI  The patient is a 35 year old obese female with PMH of intermittent asthma, seasonal allergic rhinitis and iron deficiency anemia who presents today with chest tightness, productive cough and some episodes of emesis x6 days. Endorses green mucus production, coughing attacks which cause emesis. Also endorses sinus headaches, ear pressure fullness. Denies sore throat, trouble swallowing. Self reported temperature of Tmax 100.0. Patient has at home albuterol inhaler and nebulizer, neb used 2 days ago, provided some relief. Endorses difficulty taking a deep breath, denies SOB or DOE. Symptoms and chest tightness have increased which prompted her to come to ED. Denies chest pain, palpitations, fevers, chills, sweats or additional myalgias and systemic symptoms.  Past Medical History  Diagnosis Date  . Mild intermittent asthma   . Allergic rhinitis   . Morbid obesity ([place])   . Iron deficiency anemia   . Hyperlipidemia   . Vitamin D deficiency    Past Surgical History  Procedure Laterality Date  . Cesarean section  2004   Family History  Problem Relation Age of Onset  . Diabetes Mother     died at age 3  . Heart failure Mother    Social History  Substance Use Topics  . Smoking status: Never Smoker   . Smokeless tobacco: None  . Alcohol Use: No   OB History    No data available     Review of Systems All pertinent ROS as above in HPI   Allergies  Review of patient's allergies indicates no known allergies.  Home Medications   Prior to Admission medications   Medication Sig Start Date End Date Taking? Authorizing Provider  albuterol (PROVENTIL HFA;VENTOLIN HFA) 108 (90 BASE) MCG/ACT inhaler Inhale 2 puffs into the lungs every 6 (six) hours as needed for wheezing or shortness of breath.  06/09/15   Norval Gable, MD  carbamide peroxide (DEBROX) 6.5 % otic solution Place 5 drops into the right ear 2 (two) times daily. 06/09/15 06/08/16  Norval Gable, MD  ferrous sulfate 325 (65 FE) MG tablet Take 1 tablet (325 mg total) by mouth daily. 06/09/15 06/08/16  Norval Gable, MD  HYDROcodone-homatropine [facility]) 5-1.5 MG/5ML syrup Take 5 mLs by mouth every 6 (six) hours as needed for cough. 06/09/15   Norval Gable, MD  hydrocortisone cream 1 % Apply to affected area 2 times daily as needed 06/09/15 06/08/16  Norval Gable, MD  predniSONE (STERAPRED UNI-PAK 21 TAB) 10 MG (21) TBPK tablet Take 1 tablet (10 mg total) by mouth daily. 04/29/15   Hanna Patel-Mills, PA-C   BP 147/88 mmHg  Pulse 113  Temp(Src) 98.1 F (36.7 C) (Oral)  Resp 20  SpO2 97%  LMP 10/30/2015 Physical Exam  Constitutional: She is oriented to person, place, and time. She appears well-developed and well-nourished. No distress.  HENT:  Head: Normocephalic and atraumatic.  Right Ear: External ear normal.  Left Ear: External ear normal.  Nose: Nose normal.  Mouth/Throat: Oropharynx is clear and moist. No oropharyngeal exudate.  Eyes: Pupils are equal, round, and reactive to light.  Neck: Normal range of motion. Neck supple.  Cardiovascular: Regular rhythm, normal heart sounds and intact distal pulses.  Exam reveals no gallop and no friction rub.   No  murmur heard. Mild tachycardia  Pulmonary/Chest: Effort normal.  Decreased air expansion on expiration and inspiration in upper lobes bilaterally with airspace narrowing. No overt wheezes, ronchi or rales. Decreased breath sounds at lung bases.  Abdominal: Soft. Bowel sounds are normal. She exhibits no distension. There is no tenderness.  Musculoskeletal: She exhibits no edema.  Lymphadenopathy:    She has no cervical adenopathy.  Neurological: She is alert and oriented to person, place, and time. She exhibits normal muscle tone. Coordination normal.   Skin: Skin is warm and dry. No rash noted. No erythema.  Nursing note and vitals reviewed.     ED Course  Procedures (including critical care time) Labs Review Labs Reviewed  CBC - Abnormal; Notable for the following:    Hemoglobin 11.2 (*)    HCT 35.8 (*)    Platelets 480 (*)    All other components within normal limits  BASIC METABOLIC PANEL - Abnormal; Notable for the following:    CO2 20 (*)    Glucose, Bld 138 (*)    All other components within normal limits  D-DIMER, QUANTITATIVE (NOT AT [facility])  Randolm Idol, ED    Imaging Review Dg Chest 2 View  11/06/2015  CLINICAL DATA:  Chest tightness and cough for several days EXAM: CHEST  2 VIEW COMPARISON:  03/03/2015 FINDINGS: The heart size and mediastinal contours are within normal limits. Both lungs are clear. The visualized skeletal structures are unremarkable. IMPRESSION: No active cardiopulmonary disease. Electronically Signed   By: Inez Catalina M.D.   On: 11/06/2015 09:25   I have personally reviewed and evaluated these images and lab results as part of my medical decision-making.  MDM   Assessment: The patient is a 35 year old obese female with hx of asthma. S&s concerning for bronchitis, viral vs bacterial? Nebulizer breathing treatment ordered for symptomatic relief to help increase airway movement. CXR nml, EKG with sinus tach, lung sounds with airspace narrowing. Patient is morbidly obese but no acute SOB, coagulation disorders, or smoking history, presents with tachycardia, patient has hx of mild tachy ranging 90-low 100s, ordered D-dimer to r/o potential PE however minimal risk factors  Plan: Meds ordered this encounter  Medications  . ipratropium-albuterol (DUONEB) 0.5-2.5 (3) MG/3ML nebulizer solution 3 mL    Sig:    Patient is given the plan and all questions were answered.  Patient states that she will follow-up with her primary care doctor.  I advised her to return here for any worsening in her  condition.  Dalia Heading, PA-C 11/06/15 1159  Pattricia Boss, MD 11/06/15 1536

## 2015-11-06 NOTE — ED Notes (Signed)
Pt reports vomitting since last Friday and now reports CP x 3 days. Pt alert x4. NAD at this time.

## 2015-11-12 ENCOUNTER — Telehealth: Payer: Self-pay | Admitting: *Deleted

## 2015-11-12 NOTE — Telephone Encounter (Signed)
Pt states she was seen in ED and given some cough med, after using it her lips begin to swell and tingle, she has stopped the medication feels better also took some benadryl. She is advised if she has other problems, shortness of breath, chest pain, to call 911, she states she took it yesterday and feels better today, she is informed that she may want to take the med to the pharm and ask them to destroy instead of possible taking again and not to expose the water system to the drugs, she is agreeable She is also reminded she needs appt w/ dr Melburn Hake for check up and to please inform dr Melburn Hake at visit 4/24 of the cough med reaction

## 2015-11-24 ENCOUNTER — Other Ambulatory Visit (HOSPITAL_COMMUNITY)
Admission: RE | Admit: 2015-11-24 | Discharge: 2015-11-24 | Disposition: A | Discharge status: Home / Self Care | Payer: Medicare Other | Source: Ambulatory Visit | Attending: Internal Medicine | Admitting: Internal Medicine

## 2015-11-24 ENCOUNTER — Ambulatory Visit (INDEPENDENT_AMBULATORY_CARE_PROVIDER_SITE_OTHER): Payer: Medicare Other | Admitting: Internal Medicine

## 2015-11-24 ENCOUNTER — Encounter: Payer: Self-pay | Admitting: Internal Medicine

## 2015-11-24 VITALS — BP 148/100 | HR 96 | Temp 98.1°F | Ht 59.0 in | Wt 327.9 lb

## 2015-11-24 DIAGNOSIS — I10 Essential (primary) hypertension: Secondary | ICD-10-CM

## 2015-11-24 DIAGNOSIS — L988 Other specified disorders of the skin and subcutaneous tissue: Secondary | ICD-10-CM

## 2015-11-24 DIAGNOSIS — L439 Lichen planus, unspecified: Secondary | ICD-10-CM | POA: Insufficient documentation

## 2015-11-24 DIAGNOSIS — R238 Other skin changes: Secondary | ICD-10-CM

## 2015-11-24 DIAGNOSIS — J4520 Mild intermittent asthma, uncomplicated: Secondary | ICD-10-CM

## 2015-11-24 MED ORDER — HYDROCHLOROTHIAZIDE 12.5 MG PO CAPS: 12.5000 mg | ORAL_CAPSULE | Freq: Every day | ORAL | Status: DC

## 2015-11-24 MED ORDER — TRIAMCINOLONE ACETONIDE 0.5 % EX OINT: 1.0000 "application " | TOPICAL_OINTMENT | Freq: Two times a day (BID) | CUTANEOUS | Status: DC

## 2015-11-24 MED ORDER — CETIRIZINE HCL 10 MG PO CAPS: 10.0000 mg | ORAL_CAPSULE | Freq: Every day | ORAL | Status: DC

## 2015-11-24 NOTE — Assessment & Plan Note (Signed)
She has a seasonal allergic component to her asthma; diphenhydramine causes drowsiness so I've prescribed her a lesser-sedating cetirizine to use daily.

## 2015-11-24 NOTE — Patient Instructions (Signed)
Ms. Garl,  Today we've started a blood pressure medication called thiazide.  I'll call you in about a week with the results of your biopsy. I've written you a stronger steroid to help with the itch. I'll see you back in 2 weeks to remove the sutures. If it bleeds, just put some pressure on it until it stops. This may take a few minutes.  Take care, and I'll see you in 2 weeks, Dr. Melburn Hake

## 2015-11-24 NOTE — Assessment & Plan Note (Addendum)
On her left lower ankle, she has numerous purpuric papules with a subtle overlying scale, highly suggestive of lichen planus, although she does not have Wickham's striae nor nail changes. Also on the differential is hypertrophic discoid lupus, sarcoidosis, and mycosis fungoides. I've biopsied this today as she would like to have a definitive diagnosis. Hydrocortisone 1% cream was not working, so I have written her for triamcinolone 0.5% ointment. She'll return in 2 weeks for suture removal.

## 2015-11-24 NOTE — Assessment & Plan Note (Signed)
She was hypertensive today to 150/100, as well as the last few visits, so we've started hydrochlorothiazide 12.5mg  daily. I'll see her back in 2 weeks to up-titrate this if she is not as goal of less than 140/90.

## 2015-11-24 NOTE — Progress Notes (Signed)
Patient ID: Suzanne Lewis, female   DOB: September 25, 1980, 35 y.o.   MRN: [id number] [facility] Subjective:   Patient ID: Suzanne Lewis female   DOB: November 12, 1980 35 y.o.   MRN: [id number]  HPI: Suzanne Lewis is a 35 y.o. female with mild intermittent asthma presenting to clinic for follow-up of asthma and complaint of itchy bumps on her left lower ankle.  Mild intermittent asthma: She uses her albuterol about once a week; this flares during the spring from the pollen. She takes Benadryl but would like to try something less sedating.  Purpuric papules on left lower ankle: For the last year, she has had itchy purple papules on her left lower ankle. She has not had any rashes in her flexural elbows. She denies any night sweats or anything else itching at her house, and she does not have lesions in her mouth.  She does not smoke and I have reviewed her medications with her today.  Review of Systems  Constitutional: Negative for fever, chills, weight loss and malaise/fatigue.  Respiratory: Positive for wheezing. Negative for cough and shortness of breath.   Cardiovascular: Negative for chest pain and leg swelling.  Skin: Positive for itching and rash.  Neurological: Negative for headaches.    Objective:  Physical Exam: Filed Vitals:   11/24/15 1514 11/24/15 1521  BP: 146/109 148/100  Pulse: 97 96  Temp: 98.1 F (36.7 C)   TempSrc: Oral   Height: 4\' 11"  (1.499 m)   Weight: 327 lb 14.4 oz (148.734 kg)   SpO2: 100%    General: obese friendly lady with her fiance and daughter, resting in bed comfortably, appropriately conversational HEENT: no scleral icterus, extra-ocular muscles intact, oropharynx without lesions such as Wickham's striae Cardiac: regular rate and rhythm, no rubs, murmurs or gallops Pulm: breathing well, clear to auscultation bilaterally Ext: warm and well perfused, without pedal edema Lymph: no cervical or supraclavicular  lymphadenopathy Skin: purpuric papules on the left lower ankle extending down to the first and second toe with superimposed lichenification  Left ankle biopsy: Prior to the procedure, the patient consented to the risks and benefits, and a time-out was performed. Lidocaine 1% was injected into the skin, a 43mm punch biopsy was obtained, and two sutures were placed. Hemostasis was achieved. The patient tolerated the procedure without any complications.  Assessment & Plan:  Case discussed with Dr. Daryll Drown  Papules On her left lower ankle, she has numerous purpuric papules with a subtle overlying scale, highly suggestive of lichen planus, although she does not have Wickham's striae nor nail changes. Also on the differential is hypertrophic discoid lupus, sarcoidosis, and mycosis fungoides. I've biopsied this today as she would like to have a definitive diagnosis. Hydrocortisone 1% cream was not working, so I have written her for triamcinolone 0.5% ointment. She'll return in 2 weeks for suture removal.  Mild intermittent asthma She has a seasonal allergic component to her asthma; diphenhydramine causes drowsiness so I've prescribed her a lesser-sedating cetirizine to use daily.  Essential hypertension She was hypertensive today to 150/100, as well as the last few visits, so we've started hydrochlorothiazide 12.5mg  daily. I'll see her back in 2 weeks to up-titrate this if she is not as goal of less than 140/90.   Medications Ordered Meds ordered this encounter  Medications  . triamcinolone ointment (KENALOG) 0.5 %    Sig: Apply 1 application topically 2 (two) times daily.    Dispense:  30 g  Refill:  0  . hydrochlorothiazide (MICROZIDE) 12.5 MG capsule    Sig: Take 1 capsule (12.5 mg total) by mouth daily.    Dispense:  90 capsule    Refill:  1  . Cetirizine HCl 10 MG CAPS    Sig: Take 1 capsule (10 mg total) by mouth daily.    Dispense:  90 capsule    Refill:  3   Other Orders No  orders of the defined types were placed in this encounter.   Follow Up: Return in about 2 weeks (around 12/08/2015) for suture removal.

## 2015-11-27 NOTE — Addendum Note (Signed)
Addended by: Gilles Chiquito B on: 11/27/2015 03:44 PM   Modules accepted: Level of Service

## 2015-11-27 NOTE — Progress Notes (Signed)
Internal Medicine Clinic Attending  I saw and evaluated the patient.  I personally confirmed the key portions of the history and exam documented by Dr. Melburn Hake and I reviewed pertinent patient test results.  The assessment, diagnosis, and plan were formulated together and I agree with the documentation in the resident's note.  I was present for procedure - punch biopsy.

## 2015-12-08 ENCOUNTER — Ambulatory Visit (INDEPENDENT_AMBULATORY_CARE_PROVIDER_SITE_OTHER): Payer: Medicare Other | Admitting: Internal Medicine

## 2015-12-08 ENCOUNTER — Encounter: Payer: Self-pay | Admitting: Internal Medicine

## 2015-12-08 VITALS — BP 148/102 | HR 106 | Temp 98.0°F | Wt 331.1 lb

## 2015-12-08 DIAGNOSIS — L439 Lichen planus, unspecified: Secondary | ICD-10-CM | POA: Diagnosis not present

## 2015-12-08 DIAGNOSIS — I10 Essential (primary) hypertension: Secondary | ICD-10-CM | POA: Diagnosis not present

## 2015-12-08 MED ORDER — HYDROCHLOROTHIAZIDE 25 MG PO TABS: 25.0000 mg | ORAL_TABLET | Freq: Every day | ORAL | Status: DC

## 2015-12-08 NOTE — Assessment & Plan Note (Signed)
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 The biopsy site is healing well and I've removed the sutures. She does not have risk factors for hepatitis C but given her age and sudden onset of lichen planus I think it's reasonable to screen her, so I've checked a hepatitis C antibody today. Her pruritus is already dramatically-improved with triamcinolone 0.5% ointment so I don't see a need to boost her steroid therapy at this time. I've asked her to continue this for another 2 weeks then stop and use only as needed if it flares.

## 2015-12-08 NOTE — Assessment & Plan Note (Signed)
She continues to be hypertensive today at 148/102 so I've increased her HCTZ from 12.5 to 25mg  daily. She'll follow up in 3 months for another blood pressure re-check.

## 2015-12-08 NOTE — Progress Notes (Signed)
Patient ID: Suzanne Lewis, female   DOB: 13-Mar-1981, 35 y.o.   MRN: [id number] [facility] Subjective:   Patient ID: Suzanne Lewis female   DOB: 1981/03/08 35 y.o.   MRN: [id number]  HPI: [name] is a 35 y.o. female with hypertension and lichen planus here for follow-up of both medical problems.  Hypertension: She's been taking HCTZ 12.5mg  daily without any problems. She has not been checking her pressures at home.  Lichen planus: Since putting on the triamcinolone cream, her rash is improving and hardly itches. She denies any history of IV drug abuse or tattoos.  She is not smoking and I have reviewed her medications today.  Review of Systems  Constitutional: Negative for fever, chills and weight loss.  Gastrointestinal: Negative for abdominal pain.  Skin: Positive for rash. Negative for itching.  Neurological: Negative for headaches.    Objective:  Physical Exam: Filed Vitals:   12/08/15 1400  BP: 148/102  Pulse: 106  Temp: 98 F (36.7 C)  TempSrc: Oral  Weight: 331 lb 1.6 oz (150.186 kg)  SpO2: 100%   General: resting in chair comfortably, appropriately conversational Cardiac: regular rate and rhythm, no rubs, murmurs or gallops Pulm: breathing well, clear to auscultation bilaterally Abd: obese, non-distended, no liver margin palpable, non-tender Skin: numerous purple papules on left lower anterior ankle; punch biopsy site healed well  Assessment & Plan:  Case discussed with Dr. 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 The biopsy site is healing well and I've removed the sutures. She does not have risk factors for hepatitis C but given her age and sudden onset of lichen planus I think it's reasonable to screen her, so I've checked a hepatitis C antibody today. Her pruritus is already dramatically-improved with triamcinolone 0.5% ointment so  I don't see a need to boost her steroid therapy at this time. I've asked her to continue this for another 2 weeks then stop and use only as needed if it flares.  Essential hypertension She continues to be hypertensive today at 148/102 so I've increased her HCTZ from 12.5 to 25mg  daily. She'll follow up in 3 months for another blood pressure re-check.   Medications Ordered Meds ordered this encounter  Medications  . hydrochlorothiazide (HYDRODIURIL) 25 MG tablet    Sig: Take 1 tablet (25 mg total) by mouth daily.    Dispense:  90 tablet    Refill:  3   Other Orders Orders Placed This Encounter  Procedures  . Hepatitis C antibody   Follow Up: Return in about 3 months (around 03/09/2016) for blood pressure check.

## 2015-12-09 LAB — HEPATITIS C ANTIBODY: HEP C VIRUS AB: 0.2 {s_co_ratio} (ref 0.0–0.9)

## 2015-12-10 NOTE — Progress Notes (Signed)
Case discussed with Dr. Melburn Hake at time of visit.  We reviewed the resident's history and exam and pertinent patient test results.  I agree with the assessment, diagnosis, and plan of care documented in the resident's note.

## 2016-01-20 ENCOUNTER — Encounter: Payer: Self-pay | Admitting: *Deleted

## 2016-01-30 DIAGNOSIS — H40033 Anatomical narrow angle, bilateral: Secondary | ICD-10-CM | POA: Diagnosis not present

## 2016-01-30 DIAGNOSIS — H1013 Acute atopic conjunctivitis, bilateral: Secondary | ICD-10-CM | POA: Diagnosis not present

## 2016-03-29 ENCOUNTER — Encounter: Payer: Medicare Other | Admitting: Internal Medicine

## 2016-03-30 ENCOUNTER — Other Ambulatory Visit: Payer: Self-pay | Admitting: Internal Medicine

## 2016-03-30 DIAGNOSIS — R238 Other skin changes: Secondary | ICD-10-CM

## 2016-03-31 ENCOUNTER — Other Ambulatory Visit: Payer: Self-pay | Admitting: *Deleted

## 2016-03-31 DIAGNOSIS — R238 Other skin changes: Secondary | ICD-10-CM

## 2016-04-01 DIAGNOSIS — H04123 Dry eye syndrome of bilateral lacrimal glands: Secondary | ICD-10-CM | POA: Diagnosis not present

## 2016-04-02 MED ORDER — TRIAMCINOLONE ACETONIDE 0.5 % EX OINT: 1.0000 "application " | TOPICAL_OINTMENT | Freq: Two times a day (BID) | CUTANEOUS | 0 refills | Status: DC

## 2016-04-06 ENCOUNTER — Other Ambulatory Visit: Payer: Self-pay | Admitting: *Deleted

## 2016-04-06 MED ORDER — ALBUTEROL SULFATE HFA 108 (90 BASE) MCG/ACT IN AERS: 2.0000 | INHALATION_SPRAY | Freq: Four times a day (QID) | RESPIRATORY_TRACT | 2 refills | Status: DC | PRN

## 2016-05-10 ENCOUNTER — Ambulatory Visit (INDEPENDENT_AMBULATORY_CARE_PROVIDER_SITE_OTHER): Payer: Medicare Other | Admitting: Internal Medicine

## 2016-05-10 ENCOUNTER — Encounter: Payer: Self-pay | Admitting: Internal Medicine

## 2016-05-10 VITALS — BP 152/112 | HR 97 | Temp 98.1°F | Ht 59.0 in | Wt 345.9 lb

## 2016-05-10 DIAGNOSIS — Z0000 Encounter for general adult medical examination without abnormal findings: Secondary | ICD-10-CM

## 2016-05-10 DIAGNOSIS — J351 Hypertrophy of tonsils: Secondary | ICD-10-CM

## 2016-05-10 DIAGNOSIS — L439 Lichen planus, unspecified: Secondary | ICD-10-CM

## 2016-05-10 DIAGNOSIS — H938X1 Other specified disorders of right ear: Secondary | ICD-10-CM | POA: Diagnosis not present

## 2016-05-10 DIAGNOSIS — Z23 Encounter for immunization: Secondary | ICD-10-CM

## 2016-05-10 DIAGNOSIS — G479 Sleep disorder, unspecified: Secondary | ICD-10-CM

## 2016-05-10 DIAGNOSIS — J4520 Mild intermittent asthma, uncomplicated: Secondary | ICD-10-CM

## 2016-05-10 DIAGNOSIS — I10 Essential (primary) hypertension: Secondary | ICD-10-CM

## 2016-05-10 DIAGNOSIS — J309 Allergic rhinitis, unspecified: Secondary | ICD-10-CM

## 2016-05-10 DIAGNOSIS — Z6841 Body Mass Index (BMI) 40.0 and over, adult: Secondary | ICD-10-CM

## 2016-05-10 DIAGNOSIS — D509 Iron deficiency anemia, unspecified: Secondary | ICD-10-CM

## 2016-05-10 DIAGNOSIS — Z79899 Other long term (current) drug therapy: Secondary | ICD-10-CM

## 2016-05-10 MED ORDER — FLUTICASONE PROPIONATE 50 MCG/ACT NA SUSP: 2.0000 | Freq: Two times a day (BID) | NASAL | 3 refills | Status: DC

## 2016-05-10 MED ORDER — LISINOPRIL 10 MG PO TABS: 10.0000 mg | ORAL_TABLET | Freq: Every day | ORAL | 5 refills | Status: DC

## 2016-05-10 MED ORDER — ALBUTEROL SULFATE HFA 108 (90 BASE) MCG/ACT IN AERS: 2.0000 | INHALATION_SPRAY | Freq: Four times a day (QID) | RESPIRATORY_TRACT | 2 refills | Status: DC | PRN

## 2016-05-10 MED ORDER — FLUTICASONE PROPIONATE 50 MCG/ACT NA SUSP: 2.0000 | Freq: Two times a day (BID) | NASAL | Status: DC

## 2016-05-10 NOTE — Assessment & Plan Note (Signed)
Biopsy confirmed. Well controlled on triamcinolone ointment. Hepatitis C antibody negative.  -- Continue triamcinolone ointment BID

## 2016-05-10 NOTE — Assessment & Plan Note (Signed)
Patient denies any signs of symptoms of anemia. She reports compliance with her iron supplementation. Last labs checked in April of this year and Hgb was stable at that time (11.3). Ferritin last check one year ago and was low-normal (22). Will repeat labs again in the spring.  -- Continue ferrous sulfate 325 mg daily

## 2016-05-10 NOTE — Assessment & Plan Note (Signed)
Patient received flu shot today 

## 2016-05-10 NOTE — Assessment & Plan Note (Signed)
Patient is morbidly obese with a weight of 345 lbs. Weight and lifestyle were not addressed today as patient is establishing care with a new PCP. Will discuss at her next visit. Patient did endorse some symptoms concerning for OSA (nighttime awakenings, day time sleepiness, and headaches). Given her enlarged tonsils one exam, I feel it is appropriate to pursue further work up at this time.  -- Ordered sleep study; follow up in 3 months after study to go over results

## 2016-05-10 NOTE — Assessment & Plan Note (Signed)
HCTZ increased to 25 mg daily at her last visit in May. BP continues to be elevated this visit, 144/82 and 152/112 on recheck. Patient remains asymptomatic.  -- Continue HCTZ 25 mg daily  -- Added Lisinopril 10 mg daily  -- Follow up in 4-6 weeks

## 2016-05-10 NOTE — Progress Notes (Signed)
   CC: Blood pressure follow up   HPI: Patient is a 35 yo F with pmhx of HTN, mild intermittent asthma, allergic rhinitis, and iron deficiency anemia who presents for routine follow up. Patient's HCTZ was increased at her last visit and she has been tolerating this dose well. She denies any missed doses of her medication. Her breathing has been doing well and she reports often going a week without using her rescue inhaler. She does complain of a pressure behind her right ear and feels like her hearing is muffled in that hear. She has allergic rhinitis and takes zyrtec daily. She denies fevers at home. She reports a history of recurrent strep throat infections as a child and was told that she may need to have her tonsils removed. She is wondering whether her tonsils are pushing on her ear drum. She endorses symptoms of OSA including night time awakenings, day time sleepiness, and headaches. She denies snoring or ever being told that she snores. She has been using triamcinolone ointment on her L ankle lichen planus with good response.   [name] is a 35 y.o.   Past Medical History:  Diagnosis Date  . Allergic rhinitis   . Hyperlipidemia   . Iron deficiency anemia   . Mild intermittent asthma   . Morbid obesity ([place])   . Vitamin D deficiency     Review of Systems:  All pertinents listed in HPI, otherwise negative   Physical Exam Constitutional: NAD, appears comfortable HEENT: Notable fluid level behind right tympanic membrane, but without erythema or signs of infection. Tonsils enlarged bilaterally, non erythematous, no purulent drainage.   Cardiovascular: RRR, no murmurs, rubs, or gallops.  Pulmonary/Chest: CTAB, no wheezes, rales, or rhonchi.  Abdominal: Soft, non tender, non distended. +BS.  Extremities: No edema  Skin: numerous purple papules on left lower anterior ankle with some excoriations  Psychiatric: Normal mood and affect  Vitals:   05/10/16 1317 05/10/16 1345  BP:  (!) 144/82 (!) 152/112  Pulse: (!) 103 97  Temp: 98.1 F (36.7 C)   TempSrc: Oral   SpO2: 100%   Weight: (!) 345 lb 14.4 oz (156.9 kg)   Height: 4\' 11"  (1.499 m)     Assessment & Plan:   See Encounters Tab for problem based charting.  Patient seen with Dr. Lynnae January

## 2016-05-10 NOTE — Patient Instructions (Addendum)
Please continue taking your blood pressure medicine, hydrochlorothiazide (HCTZ) 25 mg daily. I have added a second blood pressure medicine called lisinopril that I would like you to take every day as well. For your ear pressure, please start using flonase, a nasal spray, with two sprays in each nostril twice a day. I would like you to schedule two follow up appointments. One in 4-6 weeks for blood pressure recheck and one in about 3 months after you have your sleep study to follow up the results. If you have any questions or concerns, call our clinic at [phone] or after hours call [phone] and ask for the internal medicine resident on call. Thank you!

## 2016-05-11 NOTE — Progress Notes (Signed)
Internal Medicine Clinic Attending  I saw and evaluated the patient.  I personally confirmed the key portions of the history and exam documented by Dr. Guilloud and I reviewed pertinent patient test results.  The assessment, diagnosis, and plan were formulated together and I agree with the documentation in the resident's note.  

## 2016-05-13 ENCOUNTER — Other Ambulatory Visit: Payer: Self-pay

## 2016-05-13 ENCOUNTER — Emergency Department (HOSPITAL_COMMUNITY): Payer: Medicare Other

## 2016-05-13 ENCOUNTER — Encounter (HOSPITAL_COMMUNITY): Payer: Self-pay | Admitting: Emergency Medicine

## 2016-05-13 ENCOUNTER — Emergency Department (HOSPITAL_COMMUNITY)
Admission: EM | Admit: 2016-05-13 | Discharge: 2016-05-13 | Disposition: A | Discharge status: Home / Self Care | Payer: Medicare Other | Attending: Emergency Medicine | Admitting: Emergency Medicine

## 2016-05-13 DIAGNOSIS — R05 Cough: Secondary | ICD-10-CM | POA: Diagnosis not present

## 2016-05-13 DIAGNOSIS — R059 Cough, unspecified: Secondary | ICD-10-CM

## 2016-05-13 DIAGNOSIS — I10 Essential (primary) hypertension: Secondary | ICD-10-CM | POA: Insufficient documentation

## 2016-05-13 DIAGNOSIS — J4531 Mild persistent asthma with (acute) exacerbation: Secondary | ICD-10-CM

## 2016-05-13 MED ORDER — PREDNISONE 10 MG PO TABS: 40.0000 mg | ORAL_TABLET | Freq: Every day | ORAL | 0 refills | Status: AC

## 2016-05-13 MED ORDER — ALBUTEROL SULFATE HFA 108 (90 BASE) MCG/ACT IN AERS
2.0000 | INHALATION_SPRAY | Freq: Once | RESPIRATORY_TRACT | Status: AC
  Administered 2016-05-13: 2 via RESPIRATORY_TRACT
  Filled 2016-05-13: qty 6.7

## 2016-05-13 MED ORDER — PREDNISONE 20 MG PO TABS
40.0000 mg | ORAL_TABLET | Freq: Once | ORAL | Status: AC
  Administered 2016-05-13: 40 mg via ORAL
  Filled 2016-05-13: qty 2

## 2016-05-13 MED ORDER — IPRATROPIUM-ALBUTEROL 0.5-2.5 (3) MG/3ML IN SOLN
3.0000 mL | Freq: Once | RESPIRATORY_TRACT | Status: AC
  Administered 2016-05-13: 3 mL via RESPIRATORY_TRACT
  Filled 2016-05-13: qty 3

## 2016-05-13 NOTE — ED Notes (Signed)
Pt in exray 

## 2016-05-13 NOTE — ED Notes (Signed)
Pt is in stable condition upon d/c and ambulates from ED. 

## 2016-05-13 NOTE — ED Provider Notes (Signed)
[facility] DEPT Provider Note   CSN: [id number] Arrival date & time: 05/13/16  0846     History   Chief Complaint Chief Complaint  Patient presents with  . Chest Pain  . Cough    HPI Suzanne Lewis is a 35 y.o. female.  The history is provided by the patient.  Chest Pain   Associated symptoms include cough and shortness of breath. Pertinent negatives include no abdominal pain, no back pain, no fever, no palpitations and no vomiting.  Pertinent negatives for past medical history include no seizures.  Cough  This is a new problem. Episode onset: 2-3 days. The problem occurs every few minutes. The problem has been gradually worsening. The cough is productive of sputum. There has been no fever. Associated symptoms include chest pain, chills and shortness of breath. Pertinent negatives include no sweats, no ear pain, no rhinorrhea, no sore throat and no wheezing. She has tried nothing for the symptoms. The treatment provided no relief. She is not a smoker. Her past medical history is significant for asthma.   Ran out of her inhaler.  Past Medical History:  Diagnosis Date  . Allergic rhinitis   . Hyperlipidemia   . Iron deficiency anemia   . Mild intermittent asthma   . Morbid obesity ([place])   . Vitamin D deficiency     Patient Active Problem List   Diagnosis Date Noted  . Lichen planus 0000000  . Essential hypertension 11/24/2015  . Hyperlipidemia 02/09/2015  . Morbid obesity ([place]) 02/07/2015  . Mild intermittent asthma 02/07/2015  . Iron deficiency anemia 02/07/2015  . Healthcare maintenance 02/07/2015  . Allergic rhinitis 02/07/2015    Past Surgical History:  Procedure Laterality Date  . CESAREAN SECTION  2004    OB History    No data available       Home Medications    Prior to Admission medications   Medication Sig Start Date End Date Taking? Authorizing Provider  albuterol (PROVENTIL HFA;VENTOLIN HFA) 108 (90 Base) MCG/ACT inhaler Inhale 2  puffs into the lungs every 6 (six) hours as needed for wheezing or shortness of breath. 05/10/16  Yes Velna Ochs, MD  Cetirizine HCl 10 MG CAPS Take 1 capsule (10 mg total) by mouth daily. 11/24/15  Yes Loleta Chance, MD  cycloSPORINE (RESTASIS) 0.05 % ophthalmic emulsion Place 1 drop into both eyes 2 (two) times daily.   Yes Historical Provider, MD  ferrous sulfate 325 (65 FE) MG tablet Take 1 tablet (325 mg total) by mouth daily. 06/09/15 06/08/16 Yes Norval Gable, MD  hydrochlorothiazide (HYDRODIURIL) 25 MG tablet Take 1 tablet (25 mg total) by mouth daily. 12/08/15  Yes Loleta Chance, MD  lisinopril (PRINIVIL,ZESTRIL) 10 MG tablet Take 1 tablet (10 mg total) by mouth daily. 05/10/16 05/10/17 Yes Velna Ochs, MD  Olopatadine HCl (PAZEO) 0.7 % SOLN Apply 1 drop to eye daily.   Yes Historical Provider, MD  triamcinolone ointment (KENALOG) 0.5 % Apply 1 application topically 2 (two) times daily. 04/02/16  Yes Sid Falcon, MD  fluticasone (FLONASE) 50 MCG/ACT nasal spray Place 2 sprays into both nostrils 2 (two) times daily. Patient not taking: Reported on 05/13/2016 05/10/16 05/10/17  Velna Ochs, MD  predniSONE (DELTASONE) 10 MG tablet Take 4 tablets (40 mg total) by mouth daily. 05/14/16 05/18/16  Fatima Blank, MD    Family History Family History  Problem Relation Age of Onset  . Diabetes Mother     died at age 80  . Heart  failure Mother     Social History Social History  Substance Use Topics  . Smoking status: Never Smoker  . Smokeless tobacco: Not on file  . Alcohol use No     Allergies   Codeine and Citric acid   Review of Systems Review of Systems  Constitutional: Positive for chills. Negative for fever.  HENT: Negative for ear pain, rhinorrhea and sore throat.   Eyes: Negative for pain and visual disturbance.  Respiratory: Positive for cough and shortness of breath. Negative for wheezing.   Cardiovascular: Positive for chest pain. Negative for  palpitations.  Gastrointestinal: Negative for abdominal pain and vomiting.  Genitourinary: Negative for dysuria and hematuria.  Musculoskeletal: Negative for arthralgias and back pain.  Skin: Negative for color change and rash.  Neurological: Negative for seizures and syncope.  All other systems reviewed and are negative.    Physical Exam Updated Vital Signs BP 134/84 (BP Location: Right Wrist)   Pulse 94   Temp 98.4 F (36.9 C) (Oral)   Resp 20   Wt (!) 345 lb (156.5 kg)   LMP 04/13/2016   SpO2 97%   BMI 69.68 kg/m   Physical Exam  Pulmonary/Chest: No accessory muscle usage. No tachypnea. No respiratory distress. She has no decreased breath sounds. She has wheezes (throughout).     ED Treatments / Results  Labs (all labs ordered are listed, but only abnormal results are displayed) Labs Reviewed - No data to display  EKG  EKG Interpretation  Date/Time:  Thursday May 13 2016 08:49:57 EDT Ventricular Rate:  108 PR Interval:  140 QRS Duration: 72 QT Interval:  330 QTC Calculation: 442 R Axis:   42 Text Interpretation:  Sinus tachycardia Otherwise normal ECG wandering baseline  No significant change was found Confirmed by [facility] MD, Marylynn Rigdon ([id number]) on 05/13/2016 10:07:12 AM       Radiology Dg Chest 2 View  Result Date: 05/13/2016 CLINICAL DATA:  Cough for 2-3 days EXAM: CHEST  2 VIEW COMPARISON:  11/06/2015 FINDINGS: The heart size and mediastinal contours are within normal limits. Both lungs are clear. The visualized skeletal structures are unremarkable. IMPRESSION: No active cardiopulmonary disease. Electronically Signed   By: Kerby Moors M.D.   On: 05/13/2016 09:28    Procedures Procedures (including critical care time)  Medications Ordered in ED Medications  albuterol (PROVENTIL HFA;VENTOLIN HFA) 108 (90 Base) MCG/ACT inhaler 2 puff (not administered)  ipratropium-albuterol (DUONEB) 0.5-2.5 (3) MG/3ML nebulizer solution 3 mL (3 mLs Nebulization Given  05/13/16 1034)  predniSONE (DELTASONE) tablet 40 mg (40 mg Oral Given 05/13/16 1034)     Initial Impression / Assessment and Plan / ED Course  I have reviewed the triage vital signs and the nursing notes.  Pertinent labs & imaging results that were available during my care of the patient were reviewed by me and considered in my medical decision making (see chart for details).  Clinical Course    Asthma exacerbation. Significant improvement and air entry and wheezing following one DuoNeb and steroids. Chest x-ray without evidence of pneumonia. Not hypoxic on room air. Highly inconsistent with ACS. Low suspicion for pulmonary embolism.  Safe for discharge with strict return precautions. We'll provide albuterol inhaler here and prescription for steroids.  Final Clinical Impressions(s) / ED Diagnoses   Final diagnoses:  Cough  Mild persistent asthma with exacerbation   Disposition: Discharge  Condition: Good  I have discussed the results, Dx and Tx plan with the patient who expressed understanding and agree(s) with the  plan. Discharge instructions discussed at great length. The patient was given strict return precautions who verbalized understanding of the instructions. No further questions at time of discharge.    New Prescriptions   PREDNISONE (DELTASONE) 10 MG TABLET    Take 4 tablets (40 mg total) by mouth daily.    Follow Up: Velna Ochs (PCP)  Call  As needed      Fatima Blank, MD 05/13/16 1200

## 2016-05-13 NOTE — ED Notes (Signed)
EKG done and given to dr. Jeneen Rinks

## 2016-05-13 NOTE — ED Triage Notes (Signed)
Pt reports cough and chest pain since getting her flu shot on the 9th. Pt reports productive green/yellow cough and subjective fever. Denies heart hx. Pt with audible wheezing. REsp even unlabored at this time.

## 2016-05-21 ENCOUNTER — Encounter: Payer: Self-pay | Admitting: *Deleted

## 2016-06-07 ENCOUNTER — Encounter: Payer: Medicare Other | Admitting: Internal Medicine

## 2016-06-14 ENCOUNTER — Encounter: Payer: Self-pay | Admitting: Internal Medicine

## 2016-06-14 ENCOUNTER — Ambulatory Visit (INDEPENDENT_AMBULATORY_CARE_PROVIDER_SITE_OTHER): Payer: Medicare Other | Admitting: Internal Medicine

## 2016-06-14 DIAGNOSIS — Z6841 Body Mass Index (BMI) 40.0 and over, adult: Secondary | ICD-10-CM

## 2016-06-14 DIAGNOSIS — H9311 Tinnitus, right ear: Secondary | ICD-10-CM

## 2016-06-14 DIAGNOSIS — R51 Headache: Secondary | ICD-10-CM | POA: Diagnosis not present

## 2016-06-14 DIAGNOSIS — I10 Essential (primary) hypertension: Secondary | ICD-10-CM | POA: Diagnosis not present

## 2016-06-14 DIAGNOSIS — Z79899 Other long term (current) drug therapy: Secondary | ICD-10-CM | POA: Diagnosis not present

## 2016-06-14 DIAGNOSIS — G479 Sleep disorder, unspecified: Secondary | ICD-10-CM

## 2016-06-14 DIAGNOSIS — H9319 Tinnitus, unspecified ear: Secondary | ICD-10-CM | POA: Insufficient documentation

## 2016-06-14 DIAGNOSIS — L439 Lichen planus, unspecified: Secondary | ICD-10-CM | POA: Diagnosis not present

## 2016-06-14 DIAGNOSIS — Z0000 Encounter for general adult medical examination without abnormal findings: Secondary | ICD-10-CM

## 2016-06-14 DIAGNOSIS — R238 Other skin changes: Secondary | ICD-10-CM

## 2016-06-14 DIAGNOSIS — R400 Somnolence: Secondary | ICD-10-CM | POA: Diagnosis not present

## 2016-06-14 HISTORY — DX: Tinnitus, unspecified ear: H93.19

## 2016-06-14 MED ORDER — TRIAMCINOLONE ACETONIDE 0.5 % EX OINT: 1.0000 "application " | TOPICAL_OINTMENT | Freq: Two times a day (BID) | CUTANEOUS | 2 refills | Status: DC

## 2016-06-14 MED ORDER — NORETHIN-ETH ESTRAD-FE BIPHAS 1 MG-10 MCG / 10 MCG PO TABS: 1.0000 | ORAL_TABLET | Freq: Every day | ORAL | 11 refills | Status: DC

## 2016-06-14 MED ORDER — LISINOPRIL 20 MG PO TABS: 20.0000 mg | ORAL_TABLET | Freq: Every day | ORAL | 11 refills | Status: DC

## 2016-06-14 NOTE — Assessment & Plan Note (Addendum)
Patient is requesting birth control pills today. Pap smear was attempted in office today but unable to be performed due to body habitus. Referred to gynecology for pap smear. -- Started Lo Loestrin OCPs x 12 months  -- Gynecology referral

## 2016-06-14 NOTE — Progress Notes (Signed)
   CC: BP follow up   HPI:  [name] is a 35 y.o. F here for blood pressure follow up. She was started on lisinopril 10 mg daily at her last visit in addition to her HCTZ. Patient reports compliance with her medications and reports no problems with the lisinopril. She is requesting a prescription for birth control today. She was on oral contraceptives many years ago but does not remember the name. Last menstrual cycle was three weeks ago. She has not had a pap smear in many years but says her last cytology was normal. She is also complaining of ringing in her right ear. The ringing is constant and pulsatile in nature. It started a few months ago and has persisted. She denies any obvious trigger or association with medications. She endorses a generalized headache but denies dizziness or vertigo.   Past Medical History:  Diagnosis Date  . Allergic rhinitis   . Hyperlipidemia   . Iron deficiency anemia   . Mild intermittent asthma   . Morbid obesity ([place])   . Vitamin D deficiency     Review of Systems:  All pertinents listed in HPI, otherwise negative.  Vitals:   06/14/16 1324  BP: (!) 150/106  Pulse: (!) 113  Temp: 98.5 F (36.9 C)  TempSrc: Oral  SpO2: 100%  Weight: (!) 350 lb (158.8 kg)   Physical Exam Constitutional: Obese, appears comfortable Cardiovascular: RRR, no murmurs, rubs, or gallops.  Pulmonary/Chest: CTAB, no wheezes, rales, or rhonchi.  Abdominal: Soft, non tender, non distended. +BS.  GU: Normal external genitalia, unable to palpate cervix or ovaries due to habitus. Unable to visualize cervical os.   Skin: numerous purple papules on left lower anterior ankle with some excoriations  Psychiatric: Normal mood and affect  Assessment & Plan:   See Encounters Tab for problem based charting.  Patient seen with Dr. Eppie Gibson

## 2016-06-14 NOTE — Assessment & Plan Note (Addendum)
BP elevated today 150/106 and persistently elevated on recheck.  -- Continue HCTZ 25 daily -- Increase lisinopril to 20 mg daily

## 2016-06-14 NOTE — Assessment & Plan Note (Signed)
Patient continues to endorse symptoms concerning for OSA (nighttime awakenings, day time sleepiness, and headaches). Referred for sleep study at last visit, patient is scheduled for November 15th. Will follow up results.

## 2016-06-14 NOTE — Assessment & Plan Note (Addendum)
Patient is complaining of right ear tinnitus for the past few months. The ringing is constant and pulsatile in nature. Possibly related to uncontrolled HTN. Increased lisinopril today. Will refer for hearing testing and follow up in 3 months.  -- Audiology referral   ADDENDUM: Audiology evaluation reviewed. Referred to ENT per recommendations.

## 2016-06-14 NOTE — Patient Instructions (Addendum)
Please take 2 tablets of your 10 mg Lisinopril prescription daily until you run out. Then pick up your new prescription for 20 mg tablets, and take one daily. Please continue to take your other medications as previously prescribed. I have ordered a referral for your hearing test and you should be contacted for an appointment. I have also referred you to gynecology for pap smear. A prescription for your birth control has been sent to your pharmacy. Please follow up in 2-3 months for blood pressure recheck. If you have any questions or concerns, call our clinic at [phone] or after hours call [phone] and ask for the internal medicine resident on call. Thank you!

## 2016-06-14 NOTE — Assessment & Plan Note (Addendum)
Biopsy confirmed. Well controlled on triamcinolone ointment. Hepatitis C antibody negative. -- Refilled Triamcinolone ointment 0.5% BID

## 2016-06-15 NOTE — Progress Notes (Signed)
I saw and evaluated the patient.  I personally confirmed the key portions of Dr. Guilloud's history and exam and reviewed pertinent patient test results.  The assessment, diagnosis, and plan were formulated together and I agree with the documentation in the resident's note. 

## 2016-06-16 ENCOUNTER — Ambulatory Visit (HOSPITAL_BASED_OUTPATIENT_CLINIC_OR_DEPARTMENT_OTHER): Payer: Medicare Other | Attending: Internal Medicine | Admitting: Internal Medicine

## 2016-06-16 VITALS — Ht 59.0 in | Wt 350.0 lb

## 2016-06-16 DIAGNOSIS — Z6841 Body Mass Index (BMI) 40.0 and over, adult: Secondary | ICD-10-CM | POA: Diagnosis not present

## 2016-06-16 DIAGNOSIS — G4710 Hypersomnia, unspecified: Secondary | ICD-10-CM | POA: Insufficient documentation

## 2016-06-16 DIAGNOSIS — I10 Essential (primary) hypertension: Secondary | ICD-10-CM | POA: Insufficient documentation

## 2016-06-16 DIAGNOSIS — G4733 Obstructive sleep apnea (adult) (pediatric): Secondary | ICD-10-CM | POA: Diagnosis not present

## 2016-06-19 NOTE — Procedures (Signed)
Patient Name: Suzanne Lewis, Suzanne Lewis Date: 06/16/2016 Gender: Female D.O.B: [birth year] Age (years): 35 Referring Provider: Bartholomew Crews Height (inches): 23 Interpreting Physician: Baird Lyons MD, ABSM Weight (lbs): 350 RPSGT: Carolin Coy BMI: 71 MRN: [id number] Neck Size: 17.00  CLINICAL INFORMATION Sleep Study Type: Split Night CPAP Indication for sleep study: Excessive Daytime Sleepiness, Hypertension, Obesity Epworth Sleepiness Score: 3  SLEEP STUDY TECHNIQUE As per the AASM Manual for the Scoring of Sleep and Associated Events v2.3 (April 2016) with a hypopnea requiring 4% desaturations. The channels recorded and monitored were frontal, central and occipital EEG, electrooculogram (EOG), submentalis EMG (chin), nasal and oral airflow, thoracic and abdominal wall motion, anterior tibialis EMG, snore microphone, electrocardiogram, and pulse oximetry. Continuous positive airway pressure (CPAP) was initiated when the patient met split night criteria and was titrated according to treat sleep-disordered breathing.  MEDICATIONS Medications self-administered by patient taken the night of the study : NONE REPORTED  RESPIRATORY PARAMETERS Diagnostic Total AHI (/hr): 26.1 RDI (/hr): 34.6 OA Index (/hr): - CA Index (/hr): 0.0 REM AHI (/hr): 64.6 NREM AHI (/hr): 21.9 Supine AHI (/hr): 13.2 Non-supine AHI (/hr): 30.93 Min O2 Sat (%): 82.00 Mean O2 (%): 95.72 Time below 88% (min): 0.9   Titration Optimal Pressure (cm): 13 AHI at Optimal Pressure (/hr): 0.0 Min O2 at Optimal Pressure (%): 95.0 Supine % at Optimal (%): 100 Sleep % at Optimal (%): 39    SLEEP ARCHITECTURE The recording time for the entire night was 361.4 minutes. During a baseline period of 207.9 minutes, the patient slept for 133.5 minutes in REM and nonREM, yielding a sleep efficiency of 64.2%. Sleep onset after lights out was 24.4 minutes with a REM latency of 83.5 minutes. The patient spent 26.22% of the  night in stage N1 sleep, 64.04% in stage N2 sleep, 0.00% in stage N3 and 9.74% in REM. During the titration period of 150.8 minutes, the patient slept for 83.0 minutes in REM and nonREM, yielding a sleep efficiency of 55.1%. Sleep onset after CPAP initiation was 28.8 minutes with a REM latency of 70.0 minutes. The patient spent 16.87% of the night in stage N1 sleep, 49.40% in stage N2 sleep, 0.00% in stage N3 and 33.73% in REM.  CARDIAC DATA The 2 lead EKG demonstrated sinus rhythm. The mean heart rate was 86.17 beats per minute. Other EKG findings include: PVCs.  LEG MOVEMENT DATA The total Periodic Limb Movements of Sleep (PLMS) were 4. The PLMS index was 1.11 .  IMPRESSIONS - Moderate obstructive sleep apnea occurred during the diagnostic portion of the study(AHI = 26.1/hour). An optimal PAP pressure was selected for this patient ( 13 cm of water) - No significant central sleep apnea occurred during the diagnostic portion of the study (CAI = 0.0/hour). - Severe oxygen desaturation was noted during the diagnostic portion of the study (Min O2 = 82.00%). - The patient snored with Moderate snoring volume during the diagnostic portion of the study. - EKG findings include PVCs. - Clinically significant periodic limb movements did not occur during sleep.  DIAGNOSIS - Obstructive Sleep Apnea (327.23 [G47.33 ICD-10])  RECOMMENDATIONS - Trial of CPAP therapy on 13 cm H2O with a Small size Resmed Full Face Mask AirFit F20 mask and heated humidification. - Avoid alcohol, sedatives and other CNS depressants that may worsen sleep apnea and disrupt normal sleep architecture. - Sleep hygiene should be reviewed to assess factors that may improve sleep quality. - Weight management and regular exercise should be initiated or continued.  [Electronically  signed] 06/19/2016 12:21 PM  Baird Lyons MD, [place], American Board of Sleep Medicine   NPI: [id number] [place],  [facility]  ELECTRONICALLY SIGNED ON:  06/19/2016, 12:20 PM [place] PH: [phone]   FX: [phone] [place]

## 2016-06-21 NOTE — Addendum Note (Signed)
Addended by: Jodean Lima on: 06/21/2016 01:50 PM   Modules accepted: Orders

## 2016-06-21 NOTE — Assessment & Plan Note (Signed)
ADDENDUM:   Split night sleep study performed 06/19/2016. Results consistent with moderate OSA, recommend trial of CPAP. Patient was called and updated with the results, all questions were answered. Orders placed for CPAP.

## 2016-06-23 NOTE — Progress Notes (Signed)
Sleep study results. Forward to PCP

## 2016-06-30 ENCOUNTER — Encounter: Payer: Self-pay | Admitting: Internal Medicine

## 2016-07-08 ENCOUNTER — Ambulatory Visit: Payer: Medicare Other | Admitting: Audiology

## 2016-07-17 DIAGNOSIS — G4733 Obstructive sleep apnea (adult) (pediatric): Secondary | ICD-10-CM | POA: Diagnosis not present

## 2016-08-17 DIAGNOSIS — G4733 Obstructive sleep apnea (adult) (pediatric): Secondary | ICD-10-CM | POA: Diagnosis not present

## 2016-09-11 DIAGNOSIS — G4733 Obstructive sleep apnea (adult) (pediatric): Secondary | ICD-10-CM | POA: Insufficient documentation

## 2016-09-11 NOTE — Progress Notes (Signed)
   CC: BP follow up   HPI:  Suzanne Lewis is a 36 y.o. F with pmhx outlined below here for follow up of her blood pressure. Her lisinopril dose was increased at her last visit and she is doing well with this dose. She is also taking HCTZ 25 mg daily. She reports no adverse side effects or problems with these medications. She recently underwent a sleep study and was diagnosed with moderate sleep apnea. She is now wearing CPAP at night and reports improvement in her day time somnolence and headaches. She is compliant most nights with CPAP, but says some nights when she is really sleepy she will fall asleep quickly and forget to wear it. She continues to have ringing ringing in her ears but this doesn't seem to be bothering her today. She was referred to audiology at her last visit and has an appointment scheduled on Feb 20th.   Past Medical History:  Diagnosis Date  . Allergic rhinitis   . Hyperlipidemia   . Iron deficiency anemia   . Mild intermittent asthma   . Morbid obesity ([place])   . Vitamin D deficiency     Review of Systems:  All pertinents listed in HPI, otherwise negative  Physical Exam:  Vitals:   09/13/16 1336 09/13/16 1359  BP: (!) 147/90 119/76  Pulse: (!) 115 (!) 103  Temp: 98.2 F (36.8 C)   TempSrc: Oral   SpO2: 100%   Weight: (!) 351 lb 12.8 oz (159.6 kg)   Height: 4\' 11"  (1.499 m)     Constitutional: Obese, NAD, appears comfortable Cardiovascular: RRR, no murmurs, rubs, or gallops.  Pulmonary/Chest: CTAB, no wheezes, rales, or rhonchi.   Abdominal: Soft, non tender, non distended. +BS.  Extremities: Warm and well perfused. Distal pulses intact. No edema.  Neurological: A&Ox3, CN II - XII grossly intact.   Assessment & Plan:   See Encounters Tab for problem based charting.  Patient discussed with Dr. Lynnae January

## 2016-09-11 NOTE — Assessment & Plan Note (Addendum)
Patient was started on oral contraceptives at her last visit and is doing well. She denies any adverse side effects. PAP attempted at her last visit but unable to successfully performed due to morbid obesity. She was referred to gynecology and had an appointment scheduled in March for a PAP.  -- Continue OCPs -- F/u with gynecology for PAP smear -- Will check basic lab work today (A1C, BMP, CBC)

## 2016-09-11 NOTE — Assessment & Plan Note (Addendum)
Lisinopril was increased 10 -> 20 mg at last visit. She also takes HCTZ 25 mg daily. She reports compliance and denies any issues with the medications. BP today was initially elevated 147/90 but came down to 119/76 on repeat. I suspect a component of white coat HTN given her anxious affect and mild tachycardic at her last few office visits.  -- Continue lisinopril 20 mg -- Continue HCTZ 25 -- Follow up 3 months

## 2016-09-11 NOTE — Assessment & Plan Note (Addendum)
Sleep study 06/16/2016 with moderate obstructive sleep apnea. She reports compliance with her CPAP with improvement in her daytime symptoms.  -- Continue CPAP

## 2016-09-13 ENCOUNTER — Ambulatory Visit (INDEPENDENT_AMBULATORY_CARE_PROVIDER_SITE_OTHER): Payer: Medicare Other | Admitting: Internal Medicine

## 2016-09-13 ENCOUNTER — Encounter (INDEPENDENT_AMBULATORY_CARE_PROVIDER_SITE_OTHER): Payer: Self-pay

## 2016-09-13 DIAGNOSIS — G4733 Obstructive sleep apnea (adult) (pediatric): Secondary | ICD-10-CM | POA: Diagnosis not present

## 2016-09-13 DIAGNOSIS — I10 Essential (primary) hypertension: Secondary | ICD-10-CM

## 2016-09-13 DIAGNOSIS — Z0000 Encounter for general adult medical examination without abnormal findings: Secondary | ICD-10-CM

## 2016-09-13 DIAGNOSIS — D509 Iron deficiency anemia, unspecified: Secondary | ICD-10-CM

## 2016-09-13 DIAGNOSIS — Z79899 Other long term (current) drug therapy: Secondary | ICD-10-CM

## 2016-09-13 DIAGNOSIS — Z6841 Body Mass Index (BMI) 40.0 and over, adult: Secondary | ICD-10-CM

## 2016-09-13 DIAGNOSIS — Z9989 Dependence on other enabling machines and devices: Secondary | ICD-10-CM

## 2016-09-13 LAB — POCT GLYCOSYLATED HEMOGLOBIN (HGB A1C): Hemoglobin A1C: 5.9

## 2016-09-13 LAB — GLUCOSE, CAPILLARY: GLUCOSE-CAPILLARY: 98 mg/dL (ref 65–99)

## 2016-09-13 NOTE — Assessment & Plan Note (Signed)
Diagnosed in 2016. She has been on iron supplements ever since. She feels well and is asymptomatic.  -- Continue Iron supplements -- Check CBC, iron panel today

## 2016-09-13 NOTE — Patient Instructions (Signed)
Suzanne Lewis,   It was a pleasure seeing you in clinic today. I am glad you are doing well! Your BP toady is well controlled, 119/76. Please continue to take all of your medicines as previously prescribed. I will call you with the results of your blood work today. Please follow up in 3 months or sooner if you have a problem. If you have any questions or concerns, call our clinic at [phone] or after hours call [phone] and ask for the internal medicine resident on call. Thank you!

## 2016-09-14 ENCOUNTER — Telehealth: Payer: Self-pay

## 2016-09-14 LAB — CBC
HEMOGLOBIN: 11.7 g/dL (ref 11.1–15.9)
Hematocrit: 35.9 % (ref 34.0–46.6)
MCH: 28.1 pg (ref 26.6–33.0)
MCHC: 32.6 g/dL (ref 31.5–35.7)
MCV: 86 fL (ref 79–97)
PLATELETS: 538 10*3/uL — AB (ref 150–379)
RBC: 4.17 x10E6/uL (ref 3.77–5.28)
RDW: 14 % (ref 12.3–15.4)
WBC: 10.4 10*3/uL (ref 3.4–10.8)

## 2016-09-14 LAB — BMP8+ANION GAP
Anion Gap: 17 mmol/L (ref 10.0–18.0)
BUN/Creatinine Ratio: 14 (ref 9–23)
BUN: 9 mg/dL (ref 6–20)
CALCIUM: 9.9 mg/dL (ref 8.7–10.2)
CO2: 21 mmol/L (ref 18–29)
CREATININE: 0.64 mg/dL (ref 0.57–1.00)
Chloride: 97 mmol/L (ref 96–106)
GFR, EST AFRICAN AMERICAN: 133 mL/min/{1.73_m2} (ref >59)
GFR, EST NON AFRICAN AMERICAN: 115 mL/min/{1.73_m2} (ref >59)
Glucose: 93 mg/dL (ref 65–99)
POTASSIUM: 4.3 mmol/L (ref 3.5–5.2)
Sodium: 135 mmol/L (ref 134–144)

## 2016-09-14 LAB — IRON AND TIBC
IRON: 38 ug/dL (ref 27–159)
Iron Saturation: 12 % — ABNORMAL LOW (ref 15–55)
Total Iron Binding Capacity: 312 ug/dL (ref 250–450)
UIBC: 274 ug/dL (ref 131–425)

## 2016-09-14 NOTE — Telephone Encounter (Signed)
Requesting lab result. Please call back. 

## 2016-09-14 NOTE — Telephone Encounter (Signed)
Call patient and discussed lab results. Answered all questions.

## 2016-09-17 DIAGNOSIS — G4733 Obstructive sleep apnea (adult) (pediatric): Secondary | ICD-10-CM | POA: Diagnosis not present

## 2016-09-17 NOTE — Progress Notes (Signed)
Internal Medicine Clinic Attending  Case discussed with Dr. Guilloud at the time of the visit.  We reviewed the resident's history and exam and pertinent patient test results.  I agree with the assessment, diagnosis, and plan of care documented in the resident's note.  

## 2016-09-21 ENCOUNTER — Ambulatory Visit: Payer: Medicare Other | Admitting: Audiology

## 2016-09-29 ENCOUNTER — Encounter: Payer: Medicare Other | Admitting: Obstetrics & Gynecology

## 2016-10-04 ENCOUNTER — Encounter: Payer: Self-pay | Admitting: Obstetrics & Gynecology

## 2016-10-04 ENCOUNTER — Ambulatory Visit (INDEPENDENT_AMBULATORY_CARE_PROVIDER_SITE_OTHER): Payer: Medicare Other | Admitting: Obstetrics & Gynecology

## 2016-10-04 ENCOUNTER — Encounter: Payer: Medicare Other | Admitting: Obstetrics & Gynecology

## 2016-10-04 ENCOUNTER — Other Ambulatory Visit (HOSPITAL_COMMUNITY)
Admission: RE | Admit: 2016-10-04 | Discharge: 2016-10-04 | Disposition: A | Discharge status: Home / Self Care | Payer: Medicare Other | Source: Ambulatory Visit | Attending: Obstetrics & Gynecology | Admitting: Obstetrics & Gynecology

## 2016-10-04 VITALS — BP 131/77 | HR 115 | Ht <= 58 in | Wt 352.3 lb

## 2016-10-04 DIAGNOSIS — Z01419 Encounter for gynecological examination (general) (routine) without abnormal findings: Secondary | ICD-10-CM

## 2016-10-04 DIAGNOSIS — Z1151 Encounter for screening for human papillomavirus (HPV): Secondary | ICD-10-CM | POA: Insufficient documentation

## 2016-10-04 DIAGNOSIS — Z0000 Encounter for general adult medical examination without abnormal findings: Secondary | ICD-10-CM | POA: Diagnosis not present

## 2016-10-04 DIAGNOSIS — B9689 Other specified bacterial agents as the cause of diseases classified elsewhere: Secondary | ICD-10-CM

## 2016-10-04 DIAGNOSIS — N939 Abnormal uterine and vaginal bleeding, unspecified: Secondary | ICD-10-CM

## 2016-10-04 DIAGNOSIS — Z113 Encounter for screening for infections with a predominantly sexual mode of transmission: Secondary | ICD-10-CM | POA: Diagnosis present

## 2016-10-04 DIAGNOSIS — N760 Acute vaginitis: Secondary | ICD-10-CM

## 2016-10-04 MED ORDER — MEGESTROL ACETATE 40 MG PO TABS: 40.0000 mg | ORAL_TABLET | Freq: Two times a day (BID) | ORAL | 5 refills | Status: DC

## 2016-10-04 NOTE — Progress Notes (Signed)
GYNECOLOGY ANNUAL PREVENTATIVE CARE ENCOUNTER NOTE  Subjective:   Suzanne Lewis is a 36 y.o.  female here for a routine annual gynecologic exam.  Current complaints: AUB x 2 weeks.  Was on Lo-Loestrin (has HTN)  was insurance stopped paying for this; last dose was 2 days ago.  Bleeding is mild-moderate, occurring sporadically since 2 weeks ago.  No inciting factors, no previous history   Denies abnormal vaginal discharge, pelvic pain, problems with intercourse or other gynecologic concerns.    Gynecologic History Patient's last menstrual period was 09/29/2016. Contraception: none currently Last Pap: many years ago. Results were: normal  Obstetric History OB History  No data available    Past Medical History:  Diagnosis Date  . Allergic rhinitis   . Hyperlipidemia   . Iron deficiency anemia   . Mild intermittent asthma   . Morbid obesity ([place])   . Vitamin D deficiency     Past Surgical History:  Procedure Laterality Date  . CESAREAN SECTION  2004    Current Outpatient Prescriptions on File Prior to Visit  Medication Sig Dispense Refill  . albuterol (PROVENTIL HFA;VENTOLIN HFA) 108 (90 Base) MCG/ACT inhaler Inhale 2 puffs into the lungs every 6 (six) hours as needed for wheezing or shortness of breath. 1 Inhaler 2  . Cetirizine HCl 10 MG CAPS Take 1 capsule (10 mg total) by mouth daily. 90 capsule 3  . cycloSPORINE (RESTASIS) 0.05 % ophthalmic emulsion Place 1 drop into both eyes 2 (two) times daily.    . fluticasone (FLONASE) 50 MCG/ACT nasal spray Place 2 sprays into both nostrils 2 (two) times daily. 16 g 3  . hydrochlorothiazide (HYDRODIURIL) 25 MG tablet Take 1 tablet (25 mg total) by mouth daily. 90 tablet 3  . lisinopril (PRINIVIL,ZESTRIL) 20 MG tablet Take 1 tablet (20 mg total) by mouth daily. 30 tablet 11  . Olopatadine HCl (PAZEO) 0.7 % SOLN Apply 1 drop to eye daily.    [name] triamcinolone ointment (KENALOG) 0.5 % Apply 1 application topically 2 (two) times  daily. 30 g 2  . ferrous sulfate 325 (65 FE) MG tablet Take 1 tablet (325 mg total) by mouth daily. 30 tablet 6   No current facility-administered medications on file prior to visit.     Allergies  Allergen Reactions  . Codeine Swelling    Lips swelling was an orange pill.    . Citric Acid Hives and Itching    tongue     Social History   Social History  . Marital status: Single    Spouse name: N/A  . Number of children: N/A  . Years of education: N/A   Occupational History  . Not on file.   Social History Main Topics  . Smoking status: Never Smoker  . Smokeless tobacco: Never Used  . Alcohol use No  . Drug use: No  . Sexual activity: Yes     Comment: wants BC   Other Topics Concern  . Not on file   Social History Narrative  . No narrative on file    Family History  Problem Relation Age of Onset  . Diabetes Mother     died at age 45  . Heart failure Mother     The following portions of the patient's history were reviewed and updated as appropriate: allergies, current medications, past family history, past medical history, past social history, past surgical history and problem list.  Review of Systems Pertinent items noted in HPI and remainder of  comprehensive ROS otherwise negative.   Objective:  BP 131/77   Pulse (!) 115   Ht 4\' 10"  (1.473 m)   Wt (!) 352 lb 4.8 oz (159.8 kg)   LMP 09/29/2016   BMI 73.63 kg/m  CONSTITUTIONAL: Well-developed, well-nourished female in no acute distress.  HENT:  Normocephalic, atraumatic, External right and left ear normal. Oropharynx is clear and moist EYES: Conjunctivae and EOM are normal. Pupils are equal, round, and reactive to light. No scleral icterus.  NECK: Normal range of motion, supple, no masses.  Normal thyroid.  SKIN: Skin is warm and dry. No rash noted. Not diaphoretic. No erythema. No pallor. NEUROLOGIC: Alert and oriented to person, place, and time. Normal reflexes, muscle tone coordination. No cranial  nerve deficit noted. PSYCHIATRIC: Normal mood and affect. Normal behavior. Normal judgment and thought content. CARDIOVASCULAR: Normal heart rate noted, regular rhythm RESPIRATORY: Clear to auscultation bilaterally. Effort and breath sounds normal, no problems with respiration noted. BREASTS: Symmetric in size. No masses, skin changes, nipple drainage, or lymphadenopathy. ABDOMEN: Soft, obese, normal bowel sounds, no distention appreciated.  No tenderness, rebound or guarding.  PELVIC: Normal appearing external genitalia; normal appearing vaginal mucosa and cervix.  Scant bloody discharge.  Pap smear obtained.  Unable to palpate uterus or adnexa secondary to habitus.  MUSCULOSKELETAL: Normal range of motion. No tenderness.  No cyanosis, clubbing, or edema.  2+ distal pulses.   Assessment:  Annual gynecologic examination with pap smear AUB  Plan:  Will follow up results of pap smear with associated ancillary testing and manage accordingly. Pelvic scan ordered for AUB Megace ordered for AUB. Counseled about progestin IUD for contraception and AUB control, she will return for placement in 2 weeks. Brochure given to her for more information. Routine preventative health maintenance measures emphasized. Please refer to After Visit Summary for other counseling recommendations.    Verita Schneiders, MD, [place] Attending Obstetrician & Gynecologist, [place] for [facility]

## 2016-10-04 NOTE — Progress Notes (Signed)
[place] scheduled for March 9th @ 1100.  Pt notified.

## 2016-10-04 NOTE — Patient Instructions (Addendum)
Thank you for enrolling in [place]. Please follow the instructions below to securely access your online medical record. MyChart allows you to send messages to your doctor, view your test results, manage appointments, and more.   How Do I Sign Up? 1. In your Internet browser, go to [employer] and enter [email or web address]. 2. Click on the Sign Up Now link in the Sign In box. You will see the New Member Sign Up page. 3. Enter your MyChart Access Code exactly as it appears below. You will not need to use this code after you've completed the sign-up process. If you do not sign up before the expiration date, you must request a new code.  MyChart Access Code: [email or web address] Expires: 11/12/2016  1:23 PM  4. Enter your Social Security Number ([id number]) and Date of Birth (mm/dd/yyyy) as indicated and click Submit. You will be taken to the next sign-up page. 5. Create a MyChart ID. This will be your MyChart login ID and cannot be changed, so think of one that is secure and easy to remember. 6. Create a MyChart password. You can change your password at any time. 7. Enter your Password Reset Question and Answer. This can be used at a later time if you forget your password.  8. Enter your e-mail address. You will receive e-mail notification when new information is available in [place]. 9. Click Sign Up. You can now view your medical record.   Additional Information Remember, MyChart is NOT to be used for urgent needs. For medical emergencies, dial 911.     Preventive Care 18-39 Years, Female Preventive care refers to lifestyle choices and visits with your health care provider that can promote health and wellness. What does preventive care include?  A yearly physical exam. This is also called an annual well check.  Dental exams once or twice a year.  Routine eye exams. Ask your health care provider how often you should have your eyes checked.  Personal lifestyle choices,  including:  Daily care of your teeth and gums.  Regular physical activity.  Eating a healthy diet.  Avoiding tobacco and drug use.  Limiting alcohol use.  Practicing safe sex.  Taking vitamin and mineral supplements as recommended by your health care provider. What happens during an annual well check? The services and screenings done by your health care provider during your annual well check will depend on your age, overall health, lifestyle risk factors, and family history of disease. Counseling  Your health care provider may ask you questions about your:  Alcohol use.  Tobacco use.  Drug use.  Emotional well-being.  Home and relationship well-being.  Sexual activity.  Eating habits.  Work and work Statistician.  Method of birth control.  Menstrual cycle.  Pregnancy history. Screening  You may have the following tests or measurements:  Height, weight, and BMI.  Diabetes screening. This is done by checking your blood sugar (glucose) after you have not eaten for a while (fasting).  Blood pressure.  Lipid and cholesterol levels. These may be checked every 5 years starting at age 59.  Skin check.  Hepatitis C blood test.  Hepatitis B blood test.  Sexually transmitted disease (STD) testing.  BRCA-related cancer screening. This may be done if you have a family history of breast, ovarian, tubal, or peritoneal cancers.  Pelvic exam and Pap test. This may be done every 3 years starting at age 58. Starting at age 6, this may be done every 5 years if you have a  Pap test in combination with an HPV test. Discuss your test results, treatment options, and if necessary, the need for more tests with your health care provider. Vaccines  Your health care provider may recommend certain vaccines, such as:  Influenza vaccine. This is recommended every year.  Tetanus, diphtheria, and acellular pertussis (Tdap, Td) vaccine. You may need a Td booster every 10  years.  Varicella vaccine. You may need this if you have not been vaccinated.  HPV vaccine. If you are 1 or younger, you may need three doses over 6 months.  Measles, mumps, and rubella (MMR) vaccine. You may need at least one dose of MMR. You may also need a second dose.  Pneumococcal 13-valent conjugate (PCV13) vaccine. You may need this if you have certain conditions and were not previously vaccinated.  Pneumococcal polysaccharide (PPSV23) vaccine. You may need one or two doses if you smoke cigarettes or if you have certain conditions.  Meningococcal vaccine. One dose is recommended if you are age 88-21 years and a first-year college student living in a residence hall, or if you have one of several medical conditions. You may also need additional booster doses.  Hepatitis A vaccine. You may need this if you have certain conditions or if you travel or work in places where you may be exposed to hepatitis A.  Hepatitis B vaccine. You may need this if you have certain conditions or if you travel or work in places where you may be exposed to hepatitis B.  Haemophilus influenzae type b (Hib) vaccine. You may need this if you have certain risk factors. Talk to your health care provider about which screenings and vaccines you need and how often you need them. This information is not intended to replace advice given to you by your health care provider. Make sure you discuss any questions you have with your health care provider. Document Released: 09/14/2001 Document Revised: 04/07/2016 Document Reviewed: 05/20/2015 Elsevier Interactive Patient Education  2017 [employer].

## 2016-10-08 ENCOUNTER — Ambulatory Visit (HOSPITAL_COMMUNITY)
Admission: RE | Admit: 2016-10-08 | Discharge: 2016-10-08 | Disposition: A | Discharge status: Home / Self Care | Payer: Medicare Other | Source: Ambulatory Visit | Attending: Obstetrics & Gynecology | Admitting: Obstetrics & Gynecology

## 2016-10-08 DIAGNOSIS — N939 Abnormal uterine and vaginal bleeding, unspecified: Secondary | ICD-10-CM

## 2016-10-08 DIAGNOSIS — N852 Hypertrophy of uterus: Secondary | ICD-10-CM | POA: Diagnosis not present

## 2016-10-08 DIAGNOSIS — Z01419 Encounter for gynecological examination (general) (routine) without abnormal findings: Secondary | ICD-10-CM | POA: Diagnosis not present

## 2016-10-08 DIAGNOSIS — E669 Obesity, unspecified: Secondary | ICD-10-CM | POA: Insufficient documentation

## 2016-10-08 LAB — CYTOLOGY - PAP
Bacterial vaginitis: POSITIVE — AB
CANDIDA VAGINITIS: NEGATIVE
CHLAMYDIA, DNA PROBE: NEGATIVE
Diagnosis: NEGATIVE
HPV (WINDOPATH): NOT DETECTED
NEISSERIA GONORRHEA: NEGATIVE
TRICH (WINDOWPATH): NEGATIVE

## 2016-10-08 MED ORDER — METRONIDAZOLE 500 MG PO TABS: 500.0000 mg | ORAL_TABLET | Freq: Two times a day (BID) | ORAL | 0 refills | Status: DC

## 2016-10-08 NOTE — Addendum Note (Signed)
Addended by: Verita Schneiders A on: 10/08/2016 09:12 AM   Modules accepted: Orders

## 2016-10-12 ENCOUNTER — Telehealth: Payer: Self-pay

## 2016-10-12 NOTE — Telephone Encounter (Signed)
Per pharamcy patient requesting 90 day supply Lisinopril 10 mg tabs please advise

## 2016-10-12 NOTE — Telephone Encounter (Signed)
That is fine if her insurance will cover it. Does she need a new order?

## 2016-10-13 ENCOUNTER — Other Ambulatory Visit: Payer: Self-pay

## 2016-10-14 MED ORDER — LISINOPRIL 20 MG PO TABS
20.0000 mg | ORAL_TABLET | Freq: Every day | ORAL | 3 refills | Status: DC
Start: 2016-10-14 — End: 2016-12-15

## 2016-10-15 DIAGNOSIS — G4733 Obstructive sleep apnea (adult) (pediatric): Secondary | ICD-10-CM | POA: Diagnosis not present

## 2016-10-18 ENCOUNTER — Ambulatory Visit (INDEPENDENT_AMBULATORY_CARE_PROVIDER_SITE_OTHER): Payer: Medicare Other | Admitting: Obstetrics & Gynecology

## 2016-10-18 ENCOUNTER — Encounter: Payer: Self-pay | Admitting: Obstetrics & Gynecology

## 2016-10-18 VITALS — BP 118/73 | HR 115 | Wt 345.0 lb

## 2016-10-18 DIAGNOSIS — N938 Other specified abnormal uterine and vaginal bleeding: Secondary | ICD-10-CM | POA: Diagnosis not present

## 2016-10-18 DIAGNOSIS — Z3202 Encounter for pregnancy test, result negative: Secondary | ICD-10-CM | POA: Diagnosis not present

## 2016-10-18 DIAGNOSIS — N939 Abnormal uterine and vaginal bleeding, unspecified: Secondary | ICD-10-CM | POA: Diagnosis not present

## 2016-10-18 DIAGNOSIS — Z30430 Encounter for insertion of intrauterine contraceptive device: Secondary | ICD-10-CM | POA: Diagnosis not present

## 2016-10-18 LAB — POCT PREGNANCY, URINE: Preg Test, Ur: NEGATIVE

## 2016-10-18 MED ORDER — LEVONORGESTREL 18.6 MCG/DAY IU IUD
INTRAUTERINE_SYSTEM | Freq: Once | INTRAUTERINE | Status: AC
  Administered 2016-10-18: 14:00:00 1 via INTRAUTERINE

## 2016-10-18 NOTE — Patient Instructions (Signed)
Return to clinic for any scheduled appointments or for any gynecologic concerns as needed.   

## 2016-10-18 NOTE — Progress Notes (Signed)
    GYNECOLOGY OFFICE PROCEDURE NOTE  DEWEY NEUKAM is a 36 y.o. G0 here for [place] IUD insertion  For AUB. No GYN concerns.  Last pap smear was on 10/04/2016 and was normal.  IUD Insertion Procedure Note Patient identified, informed consent performed, consent signed.   Discussed risks of irregular bleeding, cramping, infection, malpositioning or misplacement of the IUD outside the uterus which may require further procedure such as laparoscopy. Time out was performed.  Urine pregnancy test negative.  Speculum placed in the vagina.  Cervix visualized.  Cleaned with Betadine x 2.  Grasped anteriorly with a single tooth tenaculum.  Uterus sounded to 11 cm.  Liletta IUD placed per manufacturer's recommendations.  Strings trimmed to 3 cm. Tenaculum was removed, good hemostasis noted.  Patient tolerated procedure well.   Patient was given post-procedure instructions.  She was advised to have backup contraception for one week.  Patient was also asked to check IUD strings periodically and follow up in 4 weeks for IUD check.   Verita Schneiders, MD, [place] Attending [place], [facility] for [employer], [place]

## 2016-10-20 DIAGNOSIS — G4733 Obstructive sleep apnea (adult) (pediatric): Secondary | ICD-10-CM | POA: Diagnosis not present

## 2016-11-09 ENCOUNTER — Other Ambulatory Visit: Payer: Self-pay | Admitting: *Deleted

## 2016-11-09 DIAGNOSIS — J4520 Mild intermittent asthma, uncomplicated: Secondary | ICD-10-CM

## 2016-11-10 MED ORDER — CETIRIZINE HCL 10 MG PO CAPS: 10.0000 mg | ORAL_CAPSULE | Freq: Every day | ORAL | 3 refills | Status: DC

## 2016-11-15 DIAGNOSIS — G4733 Obstructive sleep apnea (adult) (pediatric): Secondary | ICD-10-CM | POA: Diagnosis not present

## 2016-11-29 ENCOUNTER — Ambulatory Visit: Payer: Medicare Other | Attending: Internal Medicine | Admitting: Audiology

## 2016-11-29 DIAGNOSIS — Z789 Other specified health status: Secondary | ICD-10-CM | POA: Insufficient documentation

## 2016-11-29 DIAGNOSIS — Z8669 Personal history of other diseases of the nervous system and sense organs: Secondary | ICD-10-CM | POA: Diagnosis not present

## 2016-11-29 DIAGNOSIS — Z8709 Personal history of other diseases of the respiratory system: Secondary | ICD-10-CM | POA: Insufficient documentation

## 2016-11-29 DIAGNOSIS — H9311 Tinnitus, right ear: Secondary | ICD-10-CM | POA: Insufficient documentation

## 2016-11-29 DIAGNOSIS — Z0110 Encounter for examination of ears and hearing without abnormal findings: Secondary | ICD-10-CM | POA: Diagnosis not present

## 2016-11-29 NOTE — Procedures (Signed)
  Outpatient Audiology and [place]  [place], [place] [address]  [phone]   Audiological Evaluation  Patient Name: Suzanne Lewis   Status: Outpatient   DOB: 1981/07/07    Diagnosis: Tinnitus of right ear MRN: [id number] Date:  11/29/2016     Referent: Velna Ochs, MD  History: Suzanne Lewis was seen for an audiological evaluation. Referred for "right ear tinnitus" for  5-6 months. The tinnitus is "constant" except when pressing on her right throat, below her jaw, or when sleeping on her right side.  Suzanne Lewis has a significant history of "strep throat" and has been told that she has "enlarged tonsils".  Still has "sore throat if its cold outside".  Recently obtained a "sleep apnea machine".  Accompanied by: Her fiance. Pain: None History of hearing problems: N History of ear infections:  N History of ear surgery or "tubes" : N History of dizziness/vertigo: N History of balance issues:   N Tinnitus: Right sided, high pitched for the past 5-6 months. Sound sensitivity: Y - loud noise "hurts ear" History of occupational noise exposure: Y - lived near [place], but hasnt worked where hearing protection is required.  History of hypertension: N History of diabetes:  N Family history of hearing loss:  N    Evaluation: Conventional pure tone audiometry from 250Hz  - 8000Hz  with using insert earphones.  Hearing Thresholds are symmetrical and are 5-15 dBHL bilaterally. Reliability is good Speech reception levels (repeating words near threshold) using recorded spondee word lists:  Right ear: 10 dBHL.  Left ear:  15 dBHL Word recognition (at comfortably loud volumes) using recorded word lists at 50 dBHL, in quiet.  Right ear: 100%.  Left ear:   100% Word recognition in minimal background noise:  +5 dBHL  Right ear: 86%                              Left ear:  82%  Tympanometry shows normal middle ear volume, pressure and compliance bilaterally  (Type A) with ipsilateral acoustic reflexes that range from 85-90 dBHL from 500Hz  - 2000Hz  and 95 dB at 4000Hz  bilaterally.   CONCLUSION:      Suzanne Lewis has normal hearing threshold and middle ear function bilaterally.   Her word recognition is excellent in quiet and remains good in minimal background noise bilaterally.  Suzanne Lewis has hearing adequate for speech communication with no sign of retrocochlear pathology.  Unusual is that Suzanne Lewis reports right sided tinnitus that goes away when her right throat is pressed - since she has a history of "enlarged tonsils" and "sleep apnea" referral to an ENT is strongly recommended. The test results were discussed and Suzanne Lewis counseled.   RECOMMENDATIONS: 1.   Refer to ENT because of history of enlarged tonsils, recent "apnea machine prescribed" and right sided tinnitus that goes away when throat is pressed.   Deborah L. Heide Spark, Au.D., CCC-A Doctor of Audiology 11/29/2016   cc: Velna Ochs, MD

## 2016-12-02 NOTE — Addendum Note (Signed)
Addended by: Jodean Lima on: 12/02/2016 08:49 AM   Modules accepted: Orders

## 2016-12-14 ENCOUNTER — Telehealth: Payer: Self-pay

## 2016-12-14 NOTE — Telephone Encounter (Signed)
Unable to leave MSG for appt reminder.

## 2016-12-15 ENCOUNTER — Ambulatory Visit (INDEPENDENT_AMBULATORY_CARE_PROVIDER_SITE_OTHER): Payer: Medicare Other | Admitting: Internal Medicine

## 2016-12-15 VITALS — BP 129/84 | HR 99 | Temp 98.2°F | Wt 351.1 lb

## 2016-12-15 DIAGNOSIS — I10 Essential (primary) hypertension: Secondary | ICD-10-CM | POA: Diagnosis not present

## 2016-12-15 DIAGNOSIS — J069 Acute upper respiratory infection, unspecified: Secondary | ICD-10-CM

## 2016-12-15 DIAGNOSIS — G4733 Obstructive sleep apnea (adult) (pediatric): Secondary | ICD-10-CM | POA: Diagnosis not present

## 2016-12-15 DIAGNOSIS — H9311 Tinnitus, right ear: Secondary | ICD-10-CM

## 2016-12-15 DIAGNOSIS — Z6841 Body Mass Index (BMI) 40.0 and over, adult: Secondary | ICD-10-CM

## 2016-12-15 DIAGNOSIS — J351 Hypertrophy of tonsils: Secondary | ICD-10-CM

## 2016-12-15 MED ORDER — FLUTICASONE PROPIONATE 50 MCG/ACT NA SUSP: 2.0000 | Freq: Two times a day (BID) | NASAL | 3 refills | Status: DC

## 2016-12-15 MED ORDER — LISINOPRIL-HYDROCHLOROTHIAZIDE 20-25 MG PO TABS: 1.0000 | ORAL_TABLET | Freq: Every day | ORAL | 11 refills | Status: DC

## 2016-12-15 MED ORDER — ALBUTEROL SULFATE HFA 108 (90 BASE) MCG/ACT IN AERS: 2.0000 | INHALATION_SPRAY | Freq: Four times a day (QID) | RESPIRATORY_TRACT | 2 refills | Status: DC | PRN

## 2016-12-15 MED ORDER — GUAIFENESIN ER 600 MG PO TB12: 600.0000 mg | ORAL_TABLET | Freq: Two times a day (BID) | ORAL | 2 refills | Status: DC

## 2016-12-15 NOTE — Progress Notes (Signed)
   CC: BP follow up  HPI:  [name] is a 36 y.o. female with past medical history outlined below here for blood pressure follow up. For the details of today's visit, please refer to the assessment and plan.  Past Medical History:  Diagnosis Date  . Allergic rhinitis   . Hyperlipidemia   . Iron deficiency anemia   . Mild intermittent asthma   . Morbid obesity ([place])   . Vitamin D deficiency     Review of Systems:  All pertinents listed in HPI, otherwise negative  Physical Exam:  Vitals:   12/15/16 1310 12/15/16 1324  BP: (!) 145/87 129/84  Pulse: (!) 115 99  Temp: 98.2 F (36.8 C)   TempSrc: Oral   SpO2: 100%   Weight: (!) 351 lb 1.6 oz (159.3 kg)     Constitutional: Obese, NAD, appears comfortable HEENT: Enlarged tonsils (L>R), no exudate or erythema  Cardiovascular: Tachycardic but regular, no murmurs, rubs, or gallops.  Pulmonary/Chest: CTAB Extremities: Warm and well perfused. No edema.  Neurological: A&Ox3, CN II - XII grossly intact.  Skin: No rashes or erythema  Psychiatric: Normal mood and affect  Assessment & Plan:   See Encounters Tab for problem based charting.  Patient discussed with Dr. Angelia Mould

## 2016-12-15 NOTE — Assessment & Plan Note (Addendum)
Patient is here today for blood pressure follow-up. She reports compliance with her lisinopril 20 mg daily and HCTZ 25 mg daily. BMP checked at her last visit was within normal limits. Blood pressure was elevated today, 145/87 but improved on recheck to 129/84.  -- Changed to combination lisinopril-HCTZ 20-25 mg on [facility] $4 list  -- F/u 6 months

## 2016-12-15 NOTE — Assessment & Plan Note (Addendum)
Patient endorses symptoms of URI (cough, congestion, and sore throat) x 5 days. She has been taking Zyrtec and using her when necessary albuterol inhaler without relief. She also endorses low-grade intermittent fevers with a Tmax of 100F. I reassured patient that symptoms are likely viral in nature and should resolve without intervention. Instructed patient to call the office if symptoms have not improved in one week. -- Continue Zyrtec  -- Refilled prn albuterol  -- Mucinex 600 mg q12 prn  -- Flonase daily

## 2016-12-15 NOTE — Assessment & Plan Note (Addendum)
Patient continues to have intermittent tinnitus in her right ear that has been ongoing for the past year. She was referred to audiology at her last visit and hearing evaluation was normal. However, it was noted per audiology that her right ear tinnitus improves when pressure is applied to her right throat. This was confirmed today in clinic. In the setting of her enlarged tonsils and history of OSA, audiology recommended ENT referral for evaulation which was subsequently placed. She has not yet been contacted for an appointment. -- F/u ENT referral

## 2016-12-15 NOTE — Patient Instructions (Signed)
Ms. Dicamillo,  It was a pleasure to see you today. I have changed your blood pressure medicine to a combination lisinopril-hctz pill. It is the same dose of your previous medicine but combined into one tablet. I have also sent prescriptions to your pharmacy for flonase (an intranasal steroid) and mucinex, a decongestant. Please use these as needed for your cough and congestion. If your symptoms do not improve over the next week, please give me a call and I will call you in an antibiotic. You may follow up in 6 months - 1 year or sooner if you need [place]. If you have any questions or concerns, call our clinic at [phone] or after hours call [phone] and ask for the internal medicine resident on call. Thank you!  - Dr. Philipp Ovens

## 2016-12-17 NOTE — Progress Notes (Signed)
Internal Medicine Clinic Attending  Case discussed with Dr. Guilloud at the time of the visit.  We reviewed the resident's history and exam and pertinent patient test results.  I agree with the assessment, diagnosis, and plan of care documented in the resident's note.  

## 2016-12-22 DIAGNOSIS — H9311 Tinnitus, right ear: Secondary | ICD-10-CM | POA: Diagnosis not present

## 2016-12-29 ENCOUNTER — Other Ambulatory Visit (INDEPENDENT_AMBULATORY_CARE_PROVIDER_SITE_OTHER): Payer: Self-pay | Admitting: Otolaryngology

## 2016-12-29 DIAGNOSIS — H9319 Tinnitus, unspecified ear: Secondary | ICD-10-CM

## 2017-01-14 ENCOUNTER — Other Ambulatory Visit (INDEPENDENT_AMBULATORY_CARE_PROVIDER_SITE_OTHER): Payer: Self-pay | Admitting: Otolaryngology

## 2017-01-14 DIAGNOSIS — H9319 Tinnitus, unspecified ear: Secondary | ICD-10-CM

## 2017-01-15 DIAGNOSIS — G4733 Obstructive sleep apnea (adult) (pediatric): Secondary | ICD-10-CM | POA: Diagnosis not present

## 2017-01-25 DIAGNOSIS — G4733 Obstructive sleep apnea (adult) (pediatric): Secondary | ICD-10-CM | POA: Diagnosis not present

## 2017-01-28 ENCOUNTER — Ambulatory Visit
Admission: RE | Admit: 2017-01-28 | Discharge: 2017-01-28 | Disposition: A | Discharge status: Home / Self Care | Payer: Medicare Other | Source: Ambulatory Visit | Attending: Otolaryngology | Admitting: Otolaryngology

## 2017-01-28 DIAGNOSIS — H9319 Tinnitus, unspecified ear: Secondary | ICD-10-CM

## 2017-01-28 DIAGNOSIS — H9311 Tinnitus, right ear: Secondary | ICD-10-CM | POA: Diagnosis not present

## 2017-01-28 MED ORDER — GADOBENATE DIMEGLUMINE 529 MG/ML IV SOLN
20.0000 mL | Freq: Once | INTRAVENOUS | Status: AC | PRN
  Administered 2017-01-28: 20 mL via INTRAVENOUS

## 2017-01-31 ENCOUNTER — Other Ambulatory Visit: Payer: Self-pay | Admitting: Internal Medicine

## 2017-01-31 ENCOUNTER — Ambulatory Visit
Admission: RE | Admit: 2017-01-31 | Discharge: 2017-01-31 | Disposition: A | Discharge status: Home / Self Care | Payer: Medicare Other | Source: Ambulatory Visit | Attending: Otolaryngology | Admitting: Otolaryngology

## 2017-01-31 DIAGNOSIS — H9311 Tinnitus, right ear: Secondary | ICD-10-CM | POA: Diagnosis not present

## 2017-01-31 DIAGNOSIS — H9319 Tinnitus, unspecified ear: Secondary | ICD-10-CM

## 2017-02-14 DIAGNOSIS — G4733 Obstructive sleep apnea (adult) (pediatric): Secondary | ICD-10-CM | POA: Diagnosis not present

## 2017-03-17 DIAGNOSIS — G4733 Obstructive sleep apnea (adult) (pediatric): Secondary | ICD-10-CM | POA: Diagnosis not present

## 2017-04-17 DIAGNOSIS — G4733 Obstructive sleep apnea (adult) (pediatric): Secondary | ICD-10-CM | POA: Diagnosis not present

## 2017-04-28 DIAGNOSIS — G4733 Obstructive sleep apnea (adult) (pediatric): Secondary | ICD-10-CM | POA: Diagnosis not present

## 2017-05-03 ENCOUNTER — Ambulatory Visit (INDEPENDENT_AMBULATORY_CARE_PROVIDER_SITE_OTHER): Payer: Medicare Other | Admitting: *Deleted

## 2017-05-03 DIAGNOSIS — Z23 Encounter for immunization: Secondary | ICD-10-CM

## 2017-05-03 NOTE — Progress Notes (Signed)
Flu Shot today.  Sander Nephew, RN 05/03/2017 8:46 AM

## 2017-05-26 ENCOUNTER — Ambulatory Visit (INDEPENDENT_AMBULATORY_CARE_PROVIDER_SITE_OTHER): Payer: Medicare Other | Admitting: Internal Medicine

## 2017-05-26 VITALS — BP 131/72 | HR 107 | Temp 98.0°F | Ht 58.5 in | Wt 347.0 lb

## 2017-05-26 DIAGNOSIS — Z736 Limitation of activities due to disability: Secondary | ICD-10-CM | POA: Diagnosis not present

## 2017-05-26 DIAGNOSIS — Z6841 Body Mass Index (BMI) 40.0 and over, adult: Secondary | ICD-10-CM

## 2017-05-26 DIAGNOSIS — M21769 Unequal limb length (acquired), unspecified tibia and fibula: Secondary | ICD-10-CM | POA: Diagnosis not present

## 2017-05-26 DIAGNOSIS — L439 Lichen planus, unspecified: Secondary | ICD-10-CM | POA: Diagnosis not present

## 2017-05-26 DIAGNOSIS — Z0001 Encounter for general adult medical examination with abnormal findings: Secondary | ICD-10-CM

## 2017-05-26 DIAGNOSIS — R238 Other skin changes: Secondary | ICD-10-CM

## 2017-05-26 MED ORDER — TRIAMCINOLONE ACETONIDE 0.5 % EX OINT: 1.0000 "application " | TOPICAL_OINTMENT | Freq: Two times a day (BID) | CUTANEOUS | 2 refills | Status: DC | PRN

## 2017-05-26 MED ORDER — PHENTERMINE HCL 37.5 MG PO TABS: 37.5000 mg | ORAL_TABLET | Freq: Every day | ORAL | 0 refills | Status: DC

## 2017-05-26 NOTE — Progress Notes (Deleted)
]      ROS ] 

## 2017-05-26 NOTE — Patient Instructions (Addendum)
Annual Wellness Visit   Medicare Covered Preventative Screenings and Services  Services & Screenings Men and Women Who How Often Need? Date of Last Service Action  Abdominal Aortic Aneurysm Adults with AAA risk factors Once X    Alcohol Misuse and Counseling All Adults Screening once a year if no alcohol misuse. Counseling up to 4 face to face sessions. X    Bone Density Measurement  Adults at risk for osteoporosis Once every 2 yrs X    Lipid Panel Z13.6 All adults without CV disease Once every 5 yrs X 2016   Colorectal Cancer   Stool sample or  Colonoscopy All adults 37 and older   Once every year  Every 10 years X    Depression All Adults Once a year Yes Today  None  Diabetes Screening Blood glucose, post glucose load, or GTT Z13.1  All adults at risk  Pre-diabetics  Once per year  Twice per year X    Diabetes  Self-Management Training All adults Diabetics 10 hrs first year; 2 hours subsequent years. Requires Copay X    Glaucoma  Diabetics  Family history of glaucoma  African Americans 52 yrs +  Hispanic Americans 64 yrs + Annually - requires coppay X    Hepatitis C Z72.89 or F19.20  High Risk for HCV  Born between 1945 and 1965  Annually  Once X    HIV Z11.4 All adults based on risk  Annually btw ages 72 & 27 regardless of risk  Annually > 65 yrs if at increased risk X    Lung Cancer Screening Asymptomatic adults aged 14-77 with 30 pack yr history and current smoker OR quit within the last 15 yrs Annually Must have counseling and shared decision making documentation before first screen X    Medical Nutrition Therapy Adults with   Diabetes  Renal disease  Kidney transplant within past 3 yrs 3 hours first year; 2 hours subsequent years X    Obesity and Counseling All adults Screening once a year Counseling if BMI 30 or higher Yes Today  Starting Pheniramine, Follow up 1 month for weigh in   Tobacco Use Counseling Adults who use tobacco  Up to 8 visits in  one year X    Vaccines Z23  Hepatitis B  Influenza   Pneumonia  Adults   Once  Once every flu season  Two different vaccines separated by one year X Up to date   Next Annual Wellness Visit People with Medicare Every year Yes Today  1 year follow up    [place] Women Who How Often Need  Date of Last Service Action  Mammogram  Z12.31 Women over 21 One baseline ages 63-39. Annually ager 63 yrs+ X    Pap tests All women Annually if high risk. Every 2 yrs for normal risk women X 10/04/2016   Screening for cervical cancer with   Pap (Z01.419 nl or Z01.411abnl) &  HPV Z11.51 Women aged 11 to 10 Once every 5 yrs X 10/04/2016  Repeat 5 years  Screening pelvic and breast exams All women Annually if high risk. Every 2 yrs for normal risk women X 10/04/2016  Repeat 2 years  Sexually Transmitted Diseases  Chlamydia  Gonorrhea  Syphilis All at risk adults Annually for non pregnant females at increased risk X      Things That May Be Affecting Your Health:  Alcohol  Hearing loss  Pain    Depression  Home Safety  Sexual Health  Diabetes  Lack of physical activity  Stress   Difficulty with daily activities  Loneliness  Tiredness   Drug use  Medicines  Tobacco use   Falls X Motor Vehicle Safety X Weight  X Food choices X Oral Health  Other    YOUR PERSONALIZED HEALTH PLAN : 1. Schedule your next subsequent Medicare Wellness visit in one year 2. Attend all of your regular appointments to address your medical issues 3. Complete the preventative screenings and services 4. Please start taking pheniramine once daily. You may break the pills in half for the first week. If you tolerate this dose, you may take the whole pill once daily. Please follow up with me in 1 month. If you have any questions or concerns, call our clinic at [phone] or after hours call [phone] and ask for the internal medicine resident on call. Thank you!  - Dr. Philipp Ovens

## 2017-05-27 ENCOUNTER — Encounter: Payer: Self-pay | Admitting: Internal Medicine

## 2017-05-27 NOTE — Progress Notes (Signed)
Internal Medicine Clinic Attending  Case discussed with Dr. Guilloud at the time of the visit.  We reviewed the resident's history and exam and pertinent patient test results.  I agree with the assessment, diagnosis, and plan of care documented in the resident's note.  

## 2017-05-27 NOTE — Assessment & Plan Note (Signed)
Refilled triamcinolone cream, BID prn.
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
## 2017-06-27 ENCOUNTER — Encounter: Payer: Self-pay | Admitting: Dietician

## 2017-06-27 ENCOUNTER — Other Ambulatory Visit: Payer: Self-pay

## 2017-06-27 ENCOUNTER — Ambulatory Visit (INDEPENDENT_AMBULATORY_CARE_PROVIDER_SITE_OTHER): Payer: Medicare Other | Admitting: Internal Medicine

## 2017-06-27 ENCOUNTER — Ambulatory Visit: Payer: Medicare Other | Admitting: Dietician

## 2017-06-27 ENCOUNTER — Encounter: Payer: Self-pay | Admitting: Internal Medicine

## 2017-06-27 DIAGNOSIS — I10 Essential (primary) hypertension: Secondary | ICD-10-CM

## 2017-06-27 DIAGNOSIS — Z09 Encounter for follow-up examination after completed treatment for conditions other than malignant neoplasm: Secondary | ICD-10-CM | POA: Diagnosis not present

## 2017-06-27 DIAGNOSIS — Z975 Presence of (intrauterine) contraceptive device: Secondary | ICD-10-CM

## 2017-06-27 DIAGNOSIS — Z6841 Body Mass Index (BMI) 40.0 and over, adult: Secondary | ICD-10-CM | POA: Diagnosis not present

## 2017-06-27 DIAGNOSIS — Z79899 Other long term (current) drug therapy: Secondary | ICD-10-CM

## 2017-06-27 MED ORDER — PHENTERMINE HCL 37.5 MG PO TABS: 37.5000 mg | ORAL_TABLET | Freq: Every day | ORAL | 0 refills | Status: DC

## 2017-06-27 NOTE — Progress Notes (Signed)
   CC: Weight loss f/u  HPI:  [name] is a 36 y.o. female with past medical history outlined below here for weight loss f/u. For the details of today's visit, please refer to the assessment and plan.  Past Medical History:  Diagnosis Date  . Allergic rhinitis   . Hyperlipidemia   . Iron deficiency anemia   . Mild intermittent asthma   . Morbid obesity ([place])   . Vitamin D deficiency     Review of Systems  Cardiovascular: Negative for chest pain.  Psychiatric/Behavioral: The patient does not have insomnia.     Physical Exam:  Vitals:   06/27/17 1324 06/27/17 1418  BP: (!) 145/88 127/66  Pulse: (!) 114 (!) 102  Temp: 98.2 F (36.8 C)   TempSrc: Oral   SpO2: 98%   Weight: (!) 338 lb 9.6 oz (153.6 kg)   Height: 4\' 11"  (1.499 m)     Constitutional: Morbidly obese, NAD, appears comfortable Cardiovascular: RRR, no murmurs, rubs, or gallops.  Pulmonary/Chest: CTAB, no wheezes, rales, or rhonchi.  Extremities: Warm and well perfused. No edema.  Psychiatric: Normal mood and affect  Assessment & Plan:   See Encounters Tab for problem based charting.  Patient discussed with Dr. Daryll Drown

## 2017-06-27 NOTE — Assessment & Plan Note (Addendum)
Body mass index is 68.39 kg/m.  Patient is here for weight loss follow up. She was started on phentermine 37.5 mg daily one month ago. She has tolerated the medicine well without side effects. She denies chest pain and insomnia. She reports a curbed appetitie on the medicine. Weight is down 9lbs since her last visit. 347 -> 338 lbs. She has also made some positive dietary and lifestyle modifications. She has cut oil out of her cooking, recently purchased an air cooker for her food. She has also been walking daily. We will continue with phentermine daily for now. Follow up 1 month for weight check. She has an appointment with Butch Penny today for MNT here in [facility]. I encouraged her to keep up the good work. She is pleased with her progress thus far. Of note, patient had a Liletta IUD placed per gynecology on 10/18/16. Will emphasize again at follow up the importance of not getting pregnant while on phentermine. -- MNT appointment today -- Continue phentermine 37.5 mg daily  -- 1 month f/u

## 2017-06-27 NOTE — Patient Instructions (Signed)
Ms. Yacoub,  It was a pleasure to see you. Keep up the good work with your diet and exercise. You have lost 9 lbs since your last appointment! Great job. Please continue to take your medicine as previously prescribed. Follow up with me again in 1 month. If you have any questions or concerns, call our clinic at [phone] or after hours call [phone] and ask for the internal medicine resident on call. Thank you!  - Dr. Philipp Ovens

## 2017-06-27 NOTE — Progress Notes (Signed)
Medical Nutrition Therapy:  Appt start time: 0488 end time:  8916. Visit # 1 this year  Assessment:  Primary concerns today: weight loss  I am familiar with Suzanne Lewis from previous weight loss attempts. She is here today with her spouse and mothe rin law. She says she connives to track her steps withher phone and gets 10000-30000 steps a day. She also reports recently measuring her foods and stopping sweet drinks. Bowels move daily.  Preferred Learning Style: No preference indicated  Learning Readiness: Change in progress  ANTHROPOMETRICS: weight-339# , height-4'11', BMI-68.35 WEIGHT HISTORY: Highest: 250# in 2016 Lowest-220# in 2013 SLEEP:not discussed today MEDICATIONS: phentermine 37.5 mg daily DIETARY INTAKE: Usual eating pattern includes 2-3 meals and 1-2 snacks per day. Everyday foods include shakes, veggies.  Avoided foods include sodas.   24-hr recall:  B ( AM): boiled egg, 2 slices bacon, shake  L ( PM): often skips or has a protein shake D ( PM): 1/2 cup rice, gravy, air fryer chicken, green beans Snk ( PM): pickles Beverages: water  In order to maintain your current weight,  you should eat: 2,982 Calories/day To reach your goal of  300 lbs in 90 days,  you should eat: 1,032 Calories/day   Progress Towards Goal(s):  In progress.   Nutritional Diagnosis:  NI-1.5 Excessive energy intake As related to excess bodyweight.  As evidenced by her BMI of >50.    Intervention:  Nutrition education about balanced meals, adequate protein and nutrition, gradually increasing her activity and givnig herself enough time to achieve her goals.(smart goals)  Coordination of care:   Teaching Method Utilized: Visual, Auditory, Hands on Handouts given during visit include: avs Barriers to learning/adherence to lifestyle change: competing values Demonstrated degree of understanding via:  Teach Back   Monitoring/Evaluation:  Dietary intake, exercise, food records on camera and body weight in  4 week(s)  Plyler, Butch Penny, RD 06/28/2017 11:10 AM. .

## 2017-06-27 NOTE — Assessment & Plan Note (Addendum)
BP was initially elevated 145/88 but improved on repeat to 127/66. She is compliant with her lisinopril-HCTZ 20-25 mg daily. Will continue to monitor in the setting of phentermine use for weight loss.  -- Continue current regimen -- F/u 1 month

## 2017-06-27 NOTE — Patient Instructions (Addendum)
Please make a follow up in 1 month.  Goal for steps per day is: 3500 steps 4 days a week Goal for food is: recording your food intake using the camera on your phone. We can review at your next visit so please bring your phone.    Eat meals about 4-5 hours apart. For example- Breakfast at 8-9 am, lunch at 12- 1 PM and dinner at 5-6 PM.    Food Group            Example of food and serving size  Breakfast 8-9 AM Fruit x1   1/3 melon or 20 grapes or small apple,  cup peach or pear Protein x 2     2 tablespoons peanut butter, 2 eggs,  cup cottage cheese Starch x1   1 slice whole wheat toast    1 dairy    8 oz almond milk   Lunch 12-1 PM                       if skipping can drink a protein shake Protein x 2 ounces  2 ounces tuna/chicken/turkey/peanut butter Vegetables x2   2 cups salad Starch x1   5-6  crackers or 1 slice whole wheat bread Fruit x1   1/3 melon or 20 grapes or small apple, peach or pear Fat x 1    1 Tbsp. light mayo or salad dressing    Dinner 5-6 PM Protein x 3 ounces  3 ounces tuna/ chicken/ turkey/fish/ lean beef or pork Vegetables x2   1 cup cooked carrots, broccoli, tomatoes, okra, green beans Starch x1  cup potato, brown rice, whole wheat pasta, 1 slice whole wheat bread Fruit x1   1/3 melon or 20 grapes or small apple, peach or pear Fat x 1    1 Tbsp tub margarine   Snacks- between meals or bedtime 1 milk or starch or fruit 8 oz almond milk or 8 oz yogurt or 3 graham crackers or  banana

## 2017-06-27 NOTE — Progress Notes (Signed)
Internal Medicine Clinic Attending  Case discussed with Dr. Guilloud at the time of the visit.  We reviewed the resident's history and exam and pertinent patient test results.  I agree with the assessment, diagnosis, and plan of care documented in the resident's note.  

## 2017-06-28 ENCOUNTER — Encounter: Payer: Self-pay | Admitting: Dietician

## 2017-07-15 ENCOUNTER — Other Ambulatory Visit: Payer: Self-pay | Admitting: Internal Medicine

## 2017-07-18 ENCOUNTER — Encounter: Payer: Medicare Other | Admitting: Dietician

## 2017-07-18 ENCOUNTER — Encounter: Payer: Medicare Other | Admitting: Internal Medicine

## 2017-07-18 MED ORDER — PHENTERMINE HCL 37.5 MG PO TABS: 37.5000 mg | ORAL_TABLET | Freq: Every day | ORAL | 0 refills | Status: DC

## 2017-08-01 DIAGNOSIS — G4733 Obstructive sleep apnea (adult) (pediatric): Secondary | ICD-10-CM | POA: Diagnosis not present

## 2017-09-26 ENCOUNTER — Encounter: Payer: Self-pay | Admitting: Internal Medicine

## 2017-09-26 ENCOUNTER — Other Ambulatory Visit: Payer: Self-pay

## 2017-09-26 ENCOUNTER — Ambulatory Visit (INDEPENDENT_AMBULATORY_CARE_PROVIDER_SITE_OTHER): Payer: Medicare Other | Admitting: Internal Medicine

## 2017-09-26 VITALS — BP 118/70 | HR 98 | Temp 97.6°F | Wt 332.4 lb

## 2017-09-26 DIAGNOSIS — D509 Iron deficiency anemia, unspecified: Secondary | ICD-10-CM | POA: Diagnosis not present

## 2017-09-26 DIAGNOSIS — I10 Essential (primary) hypertension: Secondary | ICD-10-CM | POA: Diagnosis not present

## 2017-09-26 DIAGNOSIS — J4520 Mild intermittent asthma, uncomplicated: Secondary | ICD-10-CM

## 2017-09-26 DIAGNOSIS — Z6841 Body Mass Index (BMI) 40.0 and over, adult: Secondary | ICD-10-CM | POA: Diagnosis not present

## 2017-09-26 DIAGNOSIS — Z975 Presence of (intrauterine) contraceptive device: Secondary | ICD-10-CM

## 2017-09-26 DIAGNOSIS — Z79899 Other long term (current) drug therapy: Secondary | ICD-10-CM | POA: Diagnosis not present

## 2017-09-26 MED ORDER — ALBUTEROL SULFATE HFA 108 (90 BASE) MCG/ACT IN AERS: INHALATION_SPRAY | RESPIRATORY_TRACT | 5 refills | Status: DC

## 2017-09-26 MED ORDER — LISINOPRIL-HYDROCHLOROTHIAZIDE 20-25 MG PO TABS: 1.0000 | ORAL_TABLET | Freq: Every day | ORAL | 11 refills | Status: DC

## 2017-09-26 MED ORDER — PHENTERMINE HCL 37.5 MG PO TABS: 37.5000 mg | ORAL_TABLET | Freq: Every day | ORAL | 1 refills | Status: DC

## 2017-09-26 NOTE — Assessment & Plan Note (Addendum)
Well-controlled today, 118/70. Continue current regimen. -- Lisinopril-HCTZ 20-25 mg daily  -- BMP at next visit

## 2017-09-26 NOTE — Assessment & Plan Note (Signed)
Currently well controlled. Patient reports rare episodes of wheezing a few times a month. Symptoms responded well to her rescue inhaler. -- Refilled albuterol prn

## 2017-09-26 NOTE — Assessment & Plan Note (Addendum)
Patient has run out of her iron supplements. We will repeat iron studies today.  ADDENDUM: Ferritin elevated at 211, Iron normal at 40. Will have patient hold off on iron supplements for now. Repeat again in 3-6 months. Called patient with results.

## 2017-09-26 NOTE — Progress Notes (Signed)
   CC: Weight loss f/u  HPI:  [name] is a 37 y.o. female with past medical history outlined below here for weight loss follow up. For the details of today's visit, please refer to the assessment and plan.  Past Medical History:  Diagnosis Date  . Allergic rhinitis   . Hyperlipidemia   . Iron deficiency anemia   . Mild intermittent asthma   . Morbid obesity ([place])   . Vitamin D deficiency     Review of Systems  Constitutional: Positive for weight loss.  Cardiovascular: Negative for chest pain and palpitations.  Psychiatric/Behavioral: The patient does not have insomnia.     Physical Exam:  Vitals:   09/26/17 1331 09/26/17 1347  BP: 133/90 118/70  Pulse: (!) 117 98  Temp: 97.6 F (36.4 C)   TempSrc: Oral   SpO2: 98%   Weight: (!) 332 lb 6.4 oz (150.8 kg)     Constitutional: Morbidly obese, NAD, appears comfortable HEENT: Moist mucous membranes, chronically enlarged tonsils  Cardiovascular: RRR, no murmurs, rubs, or gallops.  Pulmonary/Chest: CTAB, no wheezes, rales, or rhonchi.  Extremities: Warm and well perfused. No edema.  Psychiatric: Normal mood and affect   Assessment & Plan:   See Encounters Tab for problem based charting.  Patient discussed with Dr. Dareen Piano

## 2017-09-26 NOTE — Assessment & Plan Note (Signed)
Body mass index is 67.14 kg/m.   Patient is here for a loss follow-up. She was started on phentermine 37.5 mg daily in December 2018. She ran out of this medicine a few weeks ago, but was tolerating it well up until this point. She denies chest pain, heart palpitations, and insomnia. Her weight is down today and additional 6 pounds from her last visit. She has lost 15 pounds total since starting the medication. She continues to walk daily and has made many positive dietary changes including cutting out fried foods and sodas. I encouraged patient to keep up the good work. She would like to continue with phentermine treatment as she feels this medicine is effective in curbing her appetite. She is motivated to continue losing weight. No pregnancy test needing today, IUD is still in place.  -- Continue phentermine 37.5 mg daily -- F/u 2 months for weigh in -- Encouraged her to continue with positive dietary and lifestyle changes

## 2017-09-26 NOTE — Patient Instructions (Signed)
Ms. Bacha,  Dennis Bast are doing a great job with your weight loss. Please continue to take phentermine once daily as previously prescribed. Please continue your daily walking and positive dietary changes. You have lost 15 lbs since December. Great job!   Please follow up again with me again in 2 months. If you have any questions or concerns, call our clinic at [phone] or after hours call [phone] and ask for the internal medicine resident on call. Thank you!  - Dr. Philipp Ovens

## 2017-09-27 LAB — FERRITIN: Ferritin: 211 ng/mL — ABNORMAL HIGH (ref 15–150)

## 2017-09-27 LAB — IRON AND TIBC
IRON: 40 ug/dL (ref 27–159)
Iron Saturation: 14 % — ABNORMAL LOW (ref 15–55)
Total Iron Binding Capacity: 280 ug/dL (ref 250–450)
UIBC: 240 ug/dL (ref 131–425)

## 2017-09-28 NOTE — Progress Notes (Signed)
Internal Medicine Clinic Attending  Case discussed with Dr. Guilloud at the time of the visit.  We reviewed the resident's history and exam and pertinent patient test results.  I agree with the assessment, diagnosis, and plan of care documented in the resident's note.  

## 2017-11-02 DIAGNOSIS — G4733 Obstructive sleep apnea (adult) (pediatric): Secondary | ICD-10-CM | POA: Diagnosis not present

## 2017-11-28 ENCOUNTER — Other Ambulatory Visit: Payer: Self-pay

## 2017-11-28 ENCOUNTER — Encounter: Payer: Self-pay | Admitting: Internal Medicine

## 2017-11-28 ENCOUNTER — Ambulatory Visit (INDEPENDENT_AMBULATORY_CARE_PROVIDER_SITE_OTHER): Payer: Medicare Other | Admitting: Internal Medicine

## 2017-11-28 DIAGNOSIS — Z79899 Other long term (current) drug therapy: Secondary | ICD-10-CM

## 2017-11-28 DIAGNOSIS — I10 Essential (primary) hypertension: Secondary | ICD-10-CM | POA: Diagnosis not present

## 2017-11-28 DIAGNOSIS — Z975 Presence of (intrauterine) contraceptive device: Secondary | ICD-10-CM

## 2017-11-28 DIAGNOSIS — R000 Tachycardia, unspecified: Secondary | ICD-10-CM | POA: Diagnosis not present

## 2017-11-28 DIAGNOSIS — Z6841 Body Mass Index (BMI) 40.0 and over, adult: Secondary | ICD-10-CM | POA: Diagnosis not present

## 2017-11-28 MED ORDER — PHENTERMINE HCL 37.5 MG PO TABS: 37.5000 mg | ORAL_TABLET | Freq: Every day | ORAL | 1 refills | Status: DC

## 2017-11-28 NOTE — Progress Notes (Signed)
   CC: weight loss follow up  HPI:  Suzanne Lewis is a 37 y.o. female with past medical history outlined below here for weight loss follow up. For the details of today's visit, please refer to the assessment and plan.  Past Medical History:  Diagnosis Date  . Allergic rhinitis   . Hyperlipidemia   . Iron deficiency anemia   . Mild intermittent asthma   . Morbid obesity ([place])   . Vitamin D deficiency     Review of Systems  Respiratory: Negative for shortness of breath.   Cardiovascular: Negative for chest pain and palpitations.    Physical Exam:  Vitals:   11/28/17 1320  BP: 116/88  Pulse: (!) 127  Temp: 98.4 F (36.9 C)  SpO2: 100%  Weight: (!) 322 lb 1.6 oz (146.1 kg)  Height: 4\' 11"  (1.499 m)    Constitutional: Obese, NAD, appears comfortable Cardiovascular: Tachycardic but regular, no murmurs, rubs, or gallops.  Pulmonary/Chest: CTAB, no wheezes, rales, or rhonchi.  Extremities: Warm and well perfused. No edema.  Psychiatric: Normal mood and affect  Assessment & Plan:   See Encounters Tab for problem based charting.  Patient discussed with Dr. Eppie Gibson

## 2017-11-28 NOTE — Assessment & Plan Note (Signed)
Body mass index is 65.06 kg/m.  Patient is here for weight loss follow-up.  She was started on phentermine 37.5 mg daily in October 2018.  She has lost 25 pounds overall since starting this medication, and is down 10 pounds since her last visit in February 2 months ago.  She has been actively dieting and exercising.  She is very motivated to continue losing weight and is pleased with her progress.  She denies any side effects on the medication.  Her blood pressure today is well controlled.  She does have sinus tachycardia but this is chronic and predates the initiation of phentermine.  No pregnancy test needed today, she has an IUD in place and reports she and her fianc use condoms. -- Refilled phentermine 37.5 mg daily -- Follow up 2 months for weigh in  -- Encourage patient to continue with positive dietary and lifestyle changes

## 2017-11-28 NOTE — Patient Instructions (Signed)
Suzanne Lewis,  It was a pleasure to see you. You are doing a great job with your weight loss! Keep it up! I am very pleased with your progress. You have lost 10 lbs since your last visit and 25 lbs overall. Please continue with your diet and exercise. Continue to take the medicine as previously prescribed. Come back to see me again in 2 months for another weigh in. If you have any questions or concerns, call our clinic at [phone] or after hours call [phone] and ask for the internal medicine resident on call. Thank you!  - Dr. Philipp Ovens

## 2017-11-28 NOTE — Assessment & Plan Note (Addendum)
Blood pressure well controlled today, 116/88.  Continue lisinopril-hydrochlorothiazide 20-25 mg daily. -- BMP at follow up

## 2017-12-03 NOTE — Progress Notes (Signed)
Case discussed with Dr. Guilloud at the time of the visit.  We reviewed the resident's history and exam and pertinent patient test results.  I agree with the assessment, diagnosis and plan of care documented in the resident's note. 

## 2018-01-23 ENCOUNTER — Encounter: Payer: Medicare Other | Admitting: Internal Medicine

## 2018-01-27 DIAGNOSIS — H5213 Myopia, bilateral: Secondary | ICD-10-CM | POA: Diagnosis not present

## 2018-02-01 ENCOUNTER — Other Ambulatory Visit: Payer: Self-pay | Admitting: Otolaryngology

## 2018-02-01 DIAGNOSIS — J353 Hypertrophy of tonsils with hypertrophy of adenoids: Secondary | ICD-10-CM | POA: Diagnosis not present

## 2018-02-01 DIAGNOSIS — J3503 Chronic tonsillitis and adenoiditis: Secondary | ICD-10-CM | POA: Diagnosis not present

## 2018-02-01 DIAGNOSIS — H9311 Tinnitus, right ear: Secondary | ICD-10-CM | POA: Diagnosis not present

## 2018-03-02 ENCOUNTER — Encounter: Payer: Self-pay | Admitting: *Deleted

## 2018-03-20 ENCOUNTER — Encounter: Payer: Medicare Other | Admitting: Internal Medicine

## 2018-03-20 NOTE — Pre-Procedure Instructions (Signed)
Suzanne Lewis  03/20/2018      [facility] (SE), [place] - [place] [address] [place] ([place]) [place] [address] Phone: [phone] Fax: [phone]    Your procedure is scheduled on August 28th,2019.  Report to [facility] Admitting at 6:30 A.M.  Call this number if you have problems the morning of surgery:  [phone]   Remember:  Do not eat or drink after midnight.     Take these medicines the morning of surgery with A SIP OF WATER: CETRIZINE, RESTASIS AND OLOPATADINE (PAZEO) DROPS. TAKE  TYLENOL, AND ALBUTEROL AS NEEDED. BRING INHALER WITH YOU DAY OF SURGERY.    Do not wear jewelry, make-up or nail polish.  Do not wear lotions, powders, or perfumes, or deodorant.  Do not shave 48 hours prior to surgery.  Men may shave face and neck.  Do not bring valuables to the hospital.  [facility] is not responsible for any belongings or valuables.  Contacts, dentures or bridgework may not be worn into surgery.  Leave your suitcase in the car.  After surgery it may be brought to your room.  For patients admitted to the hospital, discharge time will be determined by your treatment team.  Patients discharged the day of surgery will not be allowed to drive home.    [place]- Preparing For Surgery  Before surgery, you can play an important role. Because skin is not sterile, your skin needs to be as free of germs as possible. You can reduce the number of germs on your skin by washing with CHG (chlorahexidine gluconate) Soap before surgery.  CHG is an antiseptic cleaner which kills germs and bonds with the skin to continue killing germs even after washing.    Oral Hygiene is also important to reduce your risk of infection.  Remember - BRUSH YOUR TEETH THE MORNING OF SURGERY WITH YOUR REGULAR TOOTHPASTE  Please do not use if you have an allergy to CHG or antibacterial soaps. If your skin becomes reddened/irritated stop using the CHG.   Do not shave (including legs and underarms) for at least 48 hours prior to first CHG shower. It is OK to shave your face.  Please follow these instructions carefully.   1. Shower the NIGHT BEFORE SURGERY and the MORNING OF SURGERY with CHG.   2. If you chose to wash your hair, wash your hair first as usual with your normal shampoo.  3. After you shampoo, rinse your hair and body thoroughly to remove the shampoo.  4. Use CHG as you would any other liquid soap. You can apply CHG directly to the skin and wash gently with a scrungie or a clean washcloth.   5. Apply the CHG Soap to your body ONLY FROM THE NECK DOWN.  Do not use on open wounds or open sores. Avoid contact with your eyes, ears, mouth and genitals (private parts). Wash Face and genitals (private parts)  with your normal soap.  6. Wash thoroughly, paying special attention to the area where your surgery will be performed.  7. Thoroughly rinse your body with warm water from the neck down.  8. DO NOT shower/wash with your normal soap after using and rinsing off the CHG Soap.  9. Pat yourself dry with a CLEAN TOWEL.  10. Wear CLEAN PAJAMAS to bed the night before surgery, wear comfortable clothes the morning of surgery  11. Place CLEAN SHEETS on your bed the night of your  first shower and DO NOT SLEEP WITH PETS.    Day of Surgery:  Do not apply any deodorants/lotions.  Please wear clean clothes to the hospital/surgery center.   Remember to brush your teeth WITH YOUR REGULAR TOOTHPASTE.

## 2018-03-21 ENCOUNTER — Other Ambulatory Visit: Payer: Self-pay

## 2018-03-21 ENCOUNTER — Encounter (HOSPITAL_COMMUNITY)
Admission: RE | Admit: 2018-03-21 | Discharge: 2018-03-21 | Disposition: A | Discharge status: Home / Self Care | Payer: Medicare Other | Source: Ambulatory Visit | Attending: Otolaryngology | Admitting: Otolaryngology

## 2018-03-21 ENCOUNTER — Encounter (HOSPITAL_COMMUNITY): Payer: Self-pay

## 2018-03-21 DIAGNOSIS — Z01812 Encounter for preprocedural laboratory examination: Secondary | ICD-10-CM | POA: Diagnosis not present

## 2018-03-21 HISTORY — DX: Essential (primary) hypertension: I10

## 2018-03-21 LAB — BASIC METABOLIC PANEL
ANION GAP: 20 — AB (ref 5–15)
BUN: 10 mg/dL (ref 6–20)
CO2: 16 mmol/L — ABNORMAL LOW (ref 22–32)
CREATININE: 0.68 mg/dL (ref 0.44–1.00)
Calcium: 9.7 mg/dL (ref 8.9–10.3)
Chloride: 104 mmol/L (ref 98–111)
GFR calc non Af Amer: >60 mL/min (ref >60)
GLUCOSE: 93 mg/dL (ref 70–99)
Potassium: 3.6 mmol/L (ref 3.5–5.1)
SODIUM: 140 mmol/L (ref 135–145)

## 2018-03-21 LAB — CBC
HEMATOCRIT: 41.9 % (ref 36.0–46.0)
Hemoglobin: 13.4 g/dL (ref 12.0–15.0)
MCH: 28.5 pg (ref 26.0–34.0)
MCHC: 32 g/dL (ref 30.0–36.0)
MCV: 89.1 fL (ref 78.0–100.0)
PLATELETS: 539 10*3/uL — AB (ref 150–400)
RBC: 4.7 MIL/uL (ref 3.87–5.11)
RDW: 13 % (ref 11.5–15.5)
WBC: 8.3 10*3/uL (ref 4.0–10.5)

## 2018-03-21 NOTE — Pre-Procedure Instructions (Signed)
Suzanne Lewis  03/21/2018      [facility] (SE), [place] - [place] [address] [place] ([place]) [place] [address] Phone: [phone] Fax: [phone]    Your procedure is scheduled on Wednesday,  August 28th,2019.   Report to [facility] Admitting at 6:30 A.M.             (posted surgery time 8:30a - 9:30a)   Call this number if you have problems the morning of surgery:  [phone]   Remember:   Do not eat any foods or drink any liquids after midnight, Tuesday.     Take these medicines the morning of surgery with A SIP OF WATER: CETRIZINE, RESTASIS AND OLOPATADINE (PAZEO) DROPS. TAKE  TYLENOL, AND ALBUTEROL AS NEEDED. BRING INHALER WITH YOU DAY OF SURGERY.    Do not wear jewelry, make-up or nail polish.  Do not wear lotions, powders,  perfumes, or deodorant.  Do not shave 48 hours prior to surgery.   Do not bring valuables to the hospital.   [facility] is not responsible for any belongings or valuables.  Contacts, dentures or bridgework may not be worn into surgery.  Leave your suitcase in the car.  After surgery it may be brought to your room.  For patients admitted to the hospital, discharge time will be determined by your treatment team.  Patients discharged the day of surgery will not be allowed to drive home, AND will need someone to stay with you for the first 24 hrs.    [place]- Preparing For Surgery  Before surgery, you can play an important role. Because skin is not sterile, your skin needs to be as free of germs as possible. You can reduce the number of germs on your skin by washing with CHG (chlorahexidine gluconate) Soap before surgery.  CHG is an antiseptic cleaner which kills germs and bonds with the skin to continue killing germs even after washing.    Oral Hygiene is also important to reduce your risk of infection.    Remember - BRUSH YOUR TEETH THE MORNING OF SURGERY WITH YOUR REGULAR  TOOTHPASTE  Please do not use if you have an allergy to CHG or antibacterial soaps. If your skin becomes reddened/irritated stop using the CHG.  Do not shave (including legs and underarms) for at least 48 hours prior to first CHG shower. It is OK to shave your face.  Please follow these instructions carefully.   1. Shower the NIGHT BEFORE SURGERY and the MORNING OF SURGERY with CHG.   2. If you chose to wash your hair, wash your hair first as usual with your normal shampoo.  3. After you shampoo, rinse your hair and body thoroughly to remove the shampoo.  4. Use CHG as you would any other liquid soap. You can apply CHG directly to the skin and wash gently with a scrungie or a clean washcloth.   5. Apply the CHG Soap to your body ONLY FROM THE NECK DOWN.  Do not use on open wounds or open sores. Avoid contact with your eyes, ears, mouth and genitals (private parts). Wash Face and genitals (private parts)  with your normal soap.  6. Wash thoroughly, paying special attention to the area where your surgery will be performed.  7. Thoroughly rinse your body with warm water from the neck down.  8. DO NOT shower/wash with your normal soap after using and rinsing off the CHG Soap.  9. Pat yourself dry with a CLEAN TOWEL.  10. Wear CLEAN PAJAMAS to bed the night before surgery, wear comfortable clothes the morning of surgery  11. Place CLEAN SHEETS on your bed the night of your first shower and DO NOT SLEEP WITH PETS.  Day of Surgery:  Do not apply any deodorants/lotions.  Please wear clean clothes to the hospital/surgery center.    Remember to brush your teeth WITH YOUR REGULAR TOOTHPASTE.

## 2018-03-21 NOTE — Progress Notes (Signed)
PCP  Is Dr. Rosine Door  [place] 11/28/2017 She takes phentermine for weight loss, but states she hasn't taken any since 03/11/2018 Last flare up of asthma was 2 mths ago. Does have OSA, have instructed her to bring the mask. Has IUD, will need urine preg DOS.

## 2018-03-28 NOTE — Anesthesia Preprocedure Evaluation (Addendum)
Anesthesia Evaluation  Patient identified by MRN, date of birth, ID band Patient awake    Reviewed: Allergy & Precautions, NPO status , Patient's Chart, lab work & pertinent test results  History of Anesthesia Complications Negative for: history of anesthetic complications  Airway Mallampati: I  TM Distance: >3 FB Neck ROM: Full    Dental  (+) Dental Advisory Given, Chipped,    Pulmonary asthma , sleep apnea and Continuous Positive Airway Pressure Ventilation ,    breath sounds clear to auscultation       Cardiovascular hypertension, Pt. on medications  Rhythm:Regular Rate:Normal     Neuro/Psych  Tinnitus right ear  negative psych ROS   GI/Hepatic negative GI ROS, Neg liver ROS,   Endo/Other  Morbid obesity  Renal/GU negative Renal ROS  negative genitourinary   Musculoskeletal negative musculoskeletal ROS (+)   Abdominal (+) + obese,   Peds  Hematology negative hematology ROS (+)   Anesthesia Other Findings   Reproductive/Obstetrics                            Anesthesia Physical Anesthesia Plan  ASA: IV  Anesthesia Plan: General   Post-op Pain Management:    Induction: Intravenous  PONV Risk Score and Plan: 4 or greater and Treatment may vary due to age or medical condition, Ondansetron, Dexamethasone and Midazolam  Airway Management Planned: Oral ETT and Video Laryngoscope Planned  Additional Equipment: None  Intra-op Plan:   Post-operative Plan: Extubation in OR  Informed Consent: I have reviewed the patients History and Physical, chart, labs and discussed the procedure including the risks, benefits and alternatives for the proposed anesthesia with the patient or authorized representative who has indicated his/her understanding and acceptance.   Dental advisory given  Plan Discussed with: CRNA and Anesthesiologist  Anesthesia Plan Comments:        Anesthesia  Quick Evaluation

## 2018-03-29 ENCOUNTER — Ambulatory Visit (HOSPITAL_COMMUNITY): Payer: Medicare Other | Admitting: Anesthesiology

## 2018-03-29 ENCOUNTER — Ambulatory Visit (HOSPITAL_COMMUNITY)
Admission: RE | Admit: 2018-03-29 | Discharge: 2018-03-30 | Disposition: A | Discharge status: Home / Self Care | Payer: Medicare Other | Source: Ambulatory Visit | Attending: Otolaryngology | Admitting: Otolaryngology

## 2018-03-29 ENCOUNTER — Encounter (HOSPITAL_COMMUNITY)
Admission: RE | Disposition: A | Discharge status: Home / Self Care | Payer: Self-pay | Source: Ambulatory Visit | Attending: Otolaryngology

## 2018-03-29 ENCOUNTER — Other Ambulatory Visit: Payer: Self-pay

## 2018-03-29 ENCOUNTER — Encounter (HOSPITAL_COMMUNITY): Payer: Self-pay | Admitting: *Deleted

## 2018-03-29 DIAGNOSIS — H9311 Tinnitus, right ear: Secondary | ICD-10-CM | POA: Diagnosis not present

## 2018-03-29 DIAGNOSIS — J3503 Chronic tonsillitis and adenoiditis: Secondary | ICD-10-CM | POA: Diagnosis not present

## 2018-03-29 DIAGNOSIS — G4730 Sleep apnea, unspecified: Secondary | ICD-10-CM | POA: Insufficient documentation

## 2018-03-29 DIAGNOSIS — Z6841 Body Mass Index (BMI) 40.0 and over, adult: Secondary | ICD-10-CM | POA: Diagnosis not present

## 2018-03-29 DIAGNOSIS — D509 Iron deficiency anemia, unspecified: Secondary | ICD-10-CM | POA: Diagnosis not present

## 2018-03-29 DIAGNOSIS — J3501 Chronic tonsillitis: Secondary | ICD-10-CM | POA: Diagnosis not present

## 2018-03-29 DIAGNOSIS — Z9089 Acquired absence of other organs: Secondary | ICD-10-CM

## 2018-03-29 DIAGNOSIS — I10 Essential (primary) hypertension: Secondary | ICD-10-CM | POA: Diagnosis not present

## 2018-03-29 DIAGNOSIS — G4733 Obstructive sleep apnea (adult) (pediatric): Secondary | ICD-10-CM | POA: Diagnosis not present

## 2018-03-29 DIAGNOSIS — J353 Hypertrophy of tonsils with hypertrophy of adenoids: Secondary | ICD-10-CM | POA: Diagnosis not present

## 2018-03-29 DIAGNOSIS — J45909 Unspecified asthma, uncomplicated: Secondary | ICD-10-CM | POA: Insufficient documentation

## 2018-03-29 DIAGNOSIS — J312 Chronic pharyngitis: Secondary | ICD-10-CM | POA: Insufficient documentation

## 2018-03-29 HISTORY — DX: Dependence on other enabling machines and devices: Z99.89

## 2018-03-29 HISTORY — DX: Unspecified osteoarthritis, unspecified site: M19.90

## 2018-03-29 HISTORY — PX: TONSILLECTOMY AND ADENOIDECTOMY: SUR1326

## 2018-03-29 HISTORY — DX: Obstructive sleep apnea (adult) (pediatric): G47.33

## 2018-03-29 HISTORY — PX: TONSILLECTOMY AND ADENOIDECTOMY: SHX28

## 2018-03-29 LAB — POCT PREGNANCY, URINE: Preg Test, Ur: NEGATIVE

## 2018-03-29 SURGERY — TONSILLECTOMY AND ADENOIDECTOMY
Anesthesia: General | Site: Mouth

## 2018-03-29 MED ORDER — FENTANYL CITRATE (PF) 100 MCG/2ML IJ SOLN
INTRAMUSCULAR | Status: AC
  Filled 2018-03-29: qty 2

## 2018-03-29 MED ORDER — LIDOCAINE HCL (CARDIAC) PF 100 MG/5ML IV SOSY
PREFILLED_SYRINGE | INTRAVENOUS | Status: DC | PRN
  Administered 2018-03-29: 100 mg via INTRAVENOUS

## 2018-03-29 MED ORDER — OXYCODONE HCL 5 MG/5ML PO SOLN: 5.0000 mg | ORAL | 0 refills | Status: DC | PRN

## 2018-03-29 MED ORDER — MORPHINE SULFATE (PF) 2 MG/ML IV SOLN: 2.0000 mg | INTRAVENOUS | Status: DC | PRN

## 2018-03-29 MED ORDER — ACETAMINOPHEN 160 MG/5ML PO SOLN
650.0000 mg | Freq: Four times a day (QID) | ORAL | Status: DC | PRN
  Administered 2018-03-30: 650 mg via ORAL
  Filled 2018-03-29: qty 20.3

## 2018-03-29 MED ORDER — ONDANSETRON HCL 4 MG/2ML IJ SOLN
4.0000 mg | INTRAMUSCULAR | Status: DC | PRN
  Administered 2018-03-30: 4 mg via INTRAVENOUS
  Filled 2018-03-29: qty 2

## 2018-03-29 MED ORDER — ALBUTEROL SULFATE (2.5 MG/3ML) 0.083% IN NEBU: 3.0000 mL | INHALATION_SOLUTION | Freq: Four times a day (QID) | RESPIRATORY_TRACT | Status: DC | PRN

## 2018-03-29 MED ORDER — FENTANYL CITRATE (PF) 100 MCG/2ML IJ SOLN
25.0000 ug | INTRAMUSCULAR | Status: DC | PRN
  Administered 2018-03-29: 25 ug via INTRAVENOUS

## 2018-03-29 MED ORDER — FENTANYL CITRATE (PF) 250 MCG/5ML IJ SOLN
INTRAMUSCULAR | Status: DC | PRN
  Administered 2018-03-29: 100 ug via INTRAVENOUS

## 2018-03-29 MED ORDER — IBUPROFEN 100 MG/5ML PO SUSP
400.0000 mg | Freq: Four times a day (QID) | ORAL | Status: DC | PRN
  Administered 2018-03-29: 400 mg via ORAL
  Filled 2018-03-29 (×2): qty 20

## 2018-03-29 MED ORDER — FENTANYL CITRATE (PF) 250 MCG/5ML IJ SOLN
INTRAMUSCULAR | Status: AC
  Filled 2018-03-29: qty 5

## 2018-03-29 MED ORDER — SUCCINYLCHOLINE CHLORIDE 20 MG/ML IJ SOLN
INTRAMUSCULAR | Status: DC | PRN
  Administered 2018-03-29: 180 mg via INTRAVENOUS

## 2018-03-29 MED ORDER — AMOXICILLIN 400 MG/5ML PO SUSR: 800.0000 mg | Freq: Two times a day (BID) | ORAL | 0 refills | Status: AC

## 2018-03-29 MED ORDER — SODIUM CHLORIDE 0.9 % IR SOLN
Status: DC | PRN
  Administered 2018-03-29: 1000 mL

## 2018-03-29 MED ORDER — LISINOPRIL 20 MG PO TABS
20.0000 mg | ORAL_TABLET | Freq: Every day | ORAL | Status: DC
  Administered 2018-03-29 – 2018-03-30 (×2): 20 mg via ORAL
  Filled 2018-03-29 (×2): qty 1

## 2018-03-29 MED ORDER — 0.9 % SODIUM CHLORIDE (POUR BTL) OPTIME
TOPICAL | Status: DC | PRN
  Administered 2018-03-29: 1000 mL

## 2018-03-29 MED ORDER — HYDROCHLOROTHIAZIDE 25 MG PO TABS
25.0000 mg | ORAL_TABLET | Freq: Every day | ORAL | Status: DC
  Administered 2018-03-29 – 2018-03-30 (×2): 25 mg via ORAL
  Filled 2018-03-29 (×2): qty 1

## 2018-03-29 MED ORDER — ONDANSETRON HCL 4 MG/2ML IJ SOLN
INTRAMUSCULAR | Status: DC | PRN
  Administered 2018-03-29: 4 mg via INTRAVENOUS

## 2018-03-29 MED ORDER — NAPHAZOLINE-GLYCERIN 0.012-0.2 % OP SOLN
1.0000 [drp] | Freq: Four times a day (QID) | OPHTHALMIC | Status: DC | PRN
  Filled 2018-03-29: qty 15

## 2018-03-29 MED ORDER — KCL IN DEXTROSE-NACL 20-5-0.45 MEQ/L-%-% IV SOLN
INTRAVENOUS | Status: DC
  Administered 2018-03-29: 14:00:00 via INTRAVENOUS
  Filled 2018-03-29 (×2): qty 1000

## 2018-03-29 MED ORDER — LISINOPRIL-HYDROCHLOROTHIAZIDE 20-25 MG PO TABS: 1.0000 | ORAL_TABLET | Freq: Every day | ORAL | Status: DC

## 2018-03-29 MED ORDER — ONDANSETRON HCL 4 MG PO TABS: 4.0000 mg | ORAL_TABLET | ORAL | Status: DC | PRN

## 2018-03-29 MED ORDER — OXYCODONE HCL 5 MG/5ML PO SOLN
10.0000 mg | ORAL | Status: DC | PRN
  Administered 2018-03-29: 10 mg via ORAL
  Filled 2018-03-29: qty 10

## 2018-03-29 MED ORDER — PROPOFOL 10 MG/ML IV BOLUS
INTRAVENOUS | Status: DC | PRN
  Administered 2018-03-29 (×3): 20 mg via INTRAVENOUS
  Administered 2018-03-29: 300 mg via INTRAVENOUS

## 2018-03-29 MED ORDER — CYCLOSPORINE 0.05 % OP EMUL
1.0000 [drp] | Freq: Two times a day (BID) | OPHTHALMIC | Status: DC
  Administered 2018-03-29 – 2018-03-30 (×3): 1 [drp] via OPHTHALMIC
  Filled 2018-03-29 (×3): qty 1

## 2018-03-29 MED ORDER — OXYMETAZOLINE HCL 0.05 % NA SOLN
NASAL | Status: AC
  Filled 2018-03-29: qty 15

## 2018-03-29 MED ORDER — PROPOFOL 10 MG/ML IV BOLUS
INTRAVENOUS | Status: AC
  Filled 2018-03-29: qty 40

## 2018-03-29 MED ORDER — PROMETHAZINE HCL 25 MG/ML IJ SOLN: 6.2500 mg | INTRAMUSCULAR | Status: DC | PRN

## 2018-03-29 MED ORDER — LACTATED RINGERS IV SOLN
INTRAVENOUS | Status: DC
  Administered 2018-03-29: 07:00:00 via INTRAVENOUS

## 2018-03-29 MED ORDER — OXYMETAZOLINE HCL 0.05 % NA SOLN
NASAL | Status: DC | PRN
  Administered 2018-03-29: 1 via TOPICAL

## 2018-03-29 MED ORDER — OLOPATADINE HCL 0.7 % OP SOLN: 1.0000 [drp] | Freq: Every day | OPHTHALMIC | Status: DC

## 2018-03-29 MED ORDER — DEXAMETHASONE SODIUM PHOSPHATE 4 MG/ML IJ SOLN
INTRAMUSCULAR | Status: DC | PRN
  Administered 2018-03-29: 10 mg via INTRAVENOUS

## 2018-03-29 MED ORDER — MIDAZOLAM HCL 2 MG/2ML IJ SOLN
INTRAMUSCULAR | Status: AC
  Filled 2018-03-29: qty 2

## 2018-03-29 MED ORDER — MIDAZOLAM HCL 5 MG/5ML IJ SOLN
INTRAMUSCULAR | Status: DC | PRN
  Administered 2018-03-29: 2 mg via INTRAVENOUS

## 2018-03-29 SURGICAL SUPPLY — 24 items
CANISTER SUCT 3000ML PPV (MISCELLANEOUS) ×3 IMPLANT
CATH ROBINSON RED A/P 10FR (CATHETERS) ×2 IMPLANT
COAGULATOR SUCT 6 FR SWTCH (ELECTROSURGICAL) ×1
COAGULATOR SUCT SWTCH 10FR 6 (ELECTROSURGICAL) ×1 IMPLANT
ELECT REM PT RETURN 9FT ADLT (ELECTROSURGICAL) ×3
ELECT REM PT RETURN 9FT PED (ELECTROSURGICAL)
ELECTRODE REM PT RETRN 9FT PED (ELECTROSURGICAL) IMPLANT
ELECTRODE REM PT RTRN 9FT ADLT (ELECTROSURGICAL) IMPLANT
GAUZE 4X4 16PLY RFD (DISPOSABLE) ×3 IMPLANT
GLOVE ECLIPSE 7.5 STRL STRAW (GLOVE) ×3 IMPLANT
GLOVE SURG SS PI 6.5 STRL IVOR (GLOVE) ×2 IMPLANT
GOWN STRL REUS W/ TWL LRG LVL3 (GOWN DISPOSABLE) ×2 IMPLANT
GOWN STRL REUS W/TWL LRG LVL3 (GOWN DISPOSABLE) ×6
KIT BASIN OR (CUSTOM PROCEDURE TRAY) ×3 IMPLANT
KIT TURNOVER KIT B (KITS) ×3 IMPLANT
NS IRRIG 1000ML POUR BTL (IV SOLUTION) ×3 IMPLANT
PACK SURGICAL SETUP 50X90 (CUSTOM PROCEDURE TRAY) ×3 IMPLANT
SPONGE TONSIL TAPE 1 RFD (DISPOSABLE) ×3 IMPLANT
SYR BULB 3OZ (MISCELLANEOUS) ×3 IMPLANT
TOWEL OR 17X24 6PK STRL BLUE (TOWEL DISPOSABLE) ×4 IMPLANT
TUBE CONNECTING 12'X1/4 (SUCTIONS) ×1
TUBE CONNECTING 12X1/4 (SUCTIONS) ×2 IMPLANT
TUBE SALEM SUMP 16 FR W/ARV (TUBING) ×3 IMPLANT
WAND COBLATOR 70 EVAC XTRA (SURGICAL WAND) ×3 IMPLANT

## 2018-03-29 NOTE — Op Note (Signed)
DATE OF PROCEDURE:  03/29/2018                              OPERATIVE REPORT  SURGEON:  Leta Baptist, MD  PREOPERATIVE DIAGNOSES: 1. Adenotonsillar hypertrophy. 2. Chronic tonsillitis and pharyngitis  POSTOPERATIVE DIAGNOSES: 1. Adenotonsillar hypertrophy. 2. Chronic tonsillitis and pharyngitis  PROCEDURE PERFORMED:  Adenotonsillectomy.  ANESTHESIA:  General endotracheal tube anesthesia.  COMPLICATIONS:  None.  ESTIMATED BLOOD LOSS:  Minimal.  INDICATION FOR PROCEDURE:  Suzanne Lewis is a 37 y.o. female with a history of chronic tonsillitis/pharyngitis.  According to the patient, she has been experiencing chronic throat discomfort and infections for several years. The patient continues to be symptomatic despite medical treatments. On examination, the patient was noted to have bilateral cryptic tonsils, with numerous tonsilloliths. Based on the above findings, the decision was made for the patient to undergo the adenotonsillectomy procedure. Likelihood of success in reducing symptoms was also discussed.  The risks, benefits, alternatives, and details of the procedure were discussed with the patient.  Questions were invited and answered.  Informed consent was obtained.  DESCRIPTION:  The patient was taken to the operating room and placed supine on the operating table.  General endotracheal tube anesthesia was administered by the anesthesiologist.  The patient was positioned and prepped and draped in a standard fashion for adenotonsillectomy.  A Crowe-Davis mouth gag was inserted into the oral cavity for exposure. 3+ cryptic tonsils were noted bilaterally.  No bifidity was noted.  Indirect mirror examination of the nasopharynx revealed mild adenoid hypertrophy. The adenoid was ablated with the Coblator device. Hemostasis was achieved with the Coblator device.  The right tonsil was then grasped with a straight Allis clamp and retracted medially.  It was resected free from the underlying pharyngeal  constrictor muscles with the Coblator device.  The same procedure was repeated on the left side without exception.  The surgical sites were copiously irrigated.  The mouth gag was removed.  The care of the patient was turned over to the anesthesiologist.  The patient was awakened from anesthesia without difficulty.  The patient was extubated and transferred to the recovery room in good condition.  OPERATIVE FINDINGS:  Adenotonsillar hypertrophy.  SPECIMEN:  Bilateral tonsils  FOLLOWUP CARE:  The patient will be observed overnight in the hospital.  Suzanne Lewis 03/29/2018 9:10 AM

## 2018-03-29 NOTE — Anesthesia Procedure Notes (Signed)
Procedure Name: Intubation Date/Time: 03/29/2018 8:37 AM Performed by: Glynda Jaeger, CRNA Pre-anesthesia Checklist: Patient identified, Patient being monitored, Timeout performed, Emergency Drugs available and Suction available Patient Re-evaluated:Patient Re-evaluated prior to induction Oxygen Delivery Method: Circle System Utilized Preoxygenation: Pre-oxygenation with 100% oxygen Induction Type: IV induction Ventilation: Mask ventilation without difficulty Laryngoscope Size: 3 and Glidescope Grade View: Grade I Tube type: Oral Tube size: 7.5 mm Number of attempts: 1 Airway Equipment and Method: Stylet Placement Confirmation: ETT inserted through vocal cords under direct vision,  positive ETCO2 and breath sounds checked- equal and bilateral Secured at: 22 cm Tube secured with: Tape Dental Injury: Teeth and Oropharynx as per pre-operative assessment

## 2018-03-29 NOTE — Transfer of Care (Signed)
Immediate Anesthesia Transfer of Care Note  Patient: Suzanne Lewis  Procedure(s) Performed: TONSILLECTOMY AND ADENOIDECTOMY (N/A Mouth)  Patient Location: PACU  Anesthesia Type:General  Level of Consciousness: awake, alert , patient cooperative and responds to stimulation  Airway & Oxygen Therapy: Patient Spontanous Breathing and Patient connected to face mask oxygen  Post-op Assessment: Report given to RN, Post -op Vital signs reviewed and stable and Patient moving all extremities X 4  Post vital signs: Reviewed and stable  Last Vitals:  Vitals Value Taken Time  BP 126/82 03/29/2018  9:23 AM  Temp    Pulse 101 03/29/2018  9:23 AM  Resp 17 03/29/2018  9:23 AM  SpO2 97 % 03/29/2018  9:23 AM  Vitals shown include unvalidated device data.  Last Pain:  Vitals:   03/29/18 0708  TempSrc:   PainSc: 0-No pain         Complications: No apparent anesthesia complications

## 2018-03-29 NOTE — Anesthesia Postprocedure Evaluation (Signed)
Anesthesia Post Note  Patient: Suzanne Lewis  Procedure(s) Performed: TONSILLECTOMY AND ADENOIDECTOMY (N/A Mouth)     Patient location during evaluation: PACU Anesthesia Type: General Level of consciousness: awake and alert Pain management: pain level controlled Vital Signs Assessment: post-procedure vital signs reviewed and stable Respiratory status: spontaneous breathing, nonlabored ventilation and respiratory function stable Cardiovascular status: blood pressure returned to baseline and stable Postop Assessment: no apparent nausea or vomiting Anesthetic complications: no    Last Vitals:  Vitals:   03/29/18 1021 03/29/18 1037  BP: 128/90 (!) 128/92  Pulse: 89 83  Resp: 15 16  Temp:    SpO2: 98% 100%    Last Pain:  Vitals:   03/29/18 1021  TempSrc:   PainSc: Morris Brock

## 2018-03-29 NOTE — H&P (Signed)
Cc: Recurrent tonsillitis, pulsatile tinnitus  HPI: The patient is a 37 y/o female who returns today for follow up evaluation of her right pulsatile tinnitus. The patient was last seen in May 2018 with a normal otologic and audiologic evaluation. MRI, MRA, and MRV were obtained and were all normal. The patient notes a persistent swooshing sound in her right ear. When she applies pressure below her ear the sound stops. The patient denies hearing loss, otalgia, or vertigo. The patient has a new complaint today of recurrent sore throat. She is symptomatic several times a month. The patient has had 3-4 episodes of strep each year for the past 5 or more years. She was last treated a few weeks ago. No other ENT, GI, or respiratory issue noted since the last visit.   Exam: General: Communicates without difficulty, well nourished, no acute distress. Head: Normocephalic, no evidence injury, no tenderness, facial buttresses intact without stepoff. Eyes: PERRL, EOMI. No scleral icterus, conjunctivae clear. Neuro: CN II exam reveals vision grossly intact. No nystagmus at any point of gaze. Ears: Auricles well formed without lesions. Ear canals are intact without mass or lesion. No erythema or edema is appreciated. The TMs are intact without fluid. Nose: External evaluation reveals normal support and skin without lesions. Dorsum is intact. Anterior rhinoscopy reveals healthy pink mucosa over anterior aspect of inferior turbinates and intact septum. No purulence noted. Oral:  Oral cavity and oropharynx are intact, symmetric, without erythema or edema. Mucosa is moist without lesions. Tonsils 3+ and cryptic. Neck: Full range of motion without pain. There is no significant lymphadenopathy. No masses palpable. Thyroid bed within normal limits to palpation. Parotid glands and submandibular glands equal bilaterally without mass. Trachea is midline. Neuro:  CN 2-12 grossly intact. Gait normal. Vestibular: No nystagmus at any  point of gaze. The cerebellar examination is unremarkable.   AUDIOMETRIC TESTING:  I have read and reviewed the audiometric test, which shows normal hearing bilaterally across all frequencies. The speech reception threshold is 10dB AD and 15dB AS. The discrimination score is 100% AD and 100% AS. The tympanogram is normal bilaterally.   Assessment 1. The patient's ear canals, tympanic membranes and middle ear spaces are all normal.  2. Subjective right pulsatile tinnitus with negative MRI, MRV, and MRA. 3. The patient's history and physical exam findings are consistent with chronic tonsillitis/pharyngitis secondary to tonsillar hypertrophy.  Plan  1. The physical exam and hearing test findings are reviewed with the patient. 2. Strategies of coping with tinnitus, which include the use of masker, hearing aids, avoidance of caffeine and alcohol, and tinnitus retraining therapy are discussed with the patient.  3. The treatment options include continuing conservative observation versus tonsillectomy.  The risks, benefits, alternatives, and details of the procedure are reviewed with the patient.  Questions are invited and answered.  4. The patient is interested in proceeding with the procedure.

## 2018-03-29 NOTE — Discharge Instructions (Signed)
Markeise Mathews Raynelle Bring M.D., P.A. Postoperative Instructions for Tonsillectomy & Adenoidectomy (T&A) Activity Restrict activity at home for the first two days, resting as much as possible. Light indoor activity is best. You may usually return to school or work within a week but void strenuous activity and sports for two weeks. Sleep with your head elevated on 2-3 pillows for 3-4 days to help decrease swelling. Diet Due to tissue swelling and throat discomfort, you may have little desire to drink for several days. However fluids are very important to prevent dehydration. You will find that non-acidic juices, soups, popsicles, Jell-O, custard, puddings, and any soft or mashed foods taken in small quantities can be swallowed fairly easily. Try to increase your fluid and food intake as the discomfort subsides. It is recommended that a child receive 1-1/2 quarts of fluid in a 24-hour period. Adult require twice this amount.  Discomfort Your sore throat may be relieved by applying an ice collar to your neck and/or by taking Tylenol. You may experience an earache, which is due to referred pain from the throat. Referred ear pain is commonly felt at night when trying to rest.  Bleeding                        Although rare, there is risk of having some bleeding during the first 2 weeks after having a T&A. This usually happens between days 7-10 postoperatively. If you or your child should have any bleeding, try to remain calm. We recommend sitting up quietly in a chair and gently spitting out the blood into a bowl. For adults, gargling gently with ice water may help. If the bleeding does not stop after a short time (5 minutes), is more than 1 teaspoonful, or if you become worried, please call our office at [phone] or go directly to the nearest hospital emergency room. Do not eat or drink anything prior to going to the hospital as you may need to be taken to the operating room in order to control the bleeding. GENERAL  CONSIDERATIONS 1. Brush your teeth regularly. Avoid mouthwashes and gargles for three weeks. You may gargle gently with warm salt-water as necessary or spray with Chloraseptic. You may make salt-water by placing 2 teaspoons of table salt into a quart of fresh water. Warm the salt-water in a microwave to a luke warm temperature.  2. Avoid exposure to colds and upper respiratory infections if possible.  3. If you look into a mirror or into your child's mouth, you will see white-gray patches in the back of the throat. This is normal after having a T&A and is like a scab that forms on the skin after an abrasion. It will disappear once the back of the throat heals completely. However, it may cause a noticeable odor; this too will disappear with time. Again, warm salt-water gargles may be used to help keep the throat clean and promote healing.  4. You may notice a temporary change in voice quality, such as a higher pitched voice or a nasal sound, until healing is complete. This may last for 1-2 weeks and should resolve.  5. Do not take or give you child any medications that we have not prescribed or recommended.  6. Snoring may occur, especially at night, for the first week after a T&A. It is due to swelling of the soft palate and will usually resolve.  Please call our office at [phone] if you have any questions.

## 2018-03-30 ENCOUNTER — Encounter (HOSPITAL_COMMUNITY): Payer: Self-pay | Admitting: Otolaryngology

## 2018-03-30 DIAGNOSIS — I10 Essential (primary) hypertension: Secondary | ICD-10-CM | POA: Diagnosis not present

## 2018-03-30 DIAGNOSIS — J312 Chronic pharyngitis: Secondary | ICD-10-CM | POA: Diagnosis not present

## 2018-03-30 DIAGNOSIS — G4730 Sleep apnea, unspecified: Secondary | ICD-10-CM | POA: Diagnosis not present

## 2018-03-30 DIAGNOSIS — J45909 Unspecified asthma, uncomplicated: Secondary | ICD-10-CM | POA: Diagnosis not present

## 2018-03-30 DIAGNOSIS — H9311 Tinnitus, right ear: Secondary | ICD-10-CM | POA: Diagnosis not present

## 2018-03-30 DIAGNOSIS — J3501 Chronic tonsillitis: Secondary | ICD-10-CM | POA: Diagnosis not present

## 2018-03-30 NOTE — Discharge Summary (Signed)
Physician Discharge Summary  Patient ID: Suzanne Lewis MRN: [id number] DOB/AGE: 1980/12/02 37 y.o.  Admit date: 03/29/2018 Discharge date: 03/30/2018  Admission Diagnoses: Adenotonsillar hypertrophy  Discharge Diagnoses:  Adenotonsillar hypertrophy Active Problems:   S/P T&A (status post tonsillectomy and adenoidectomy)   Discharged Condition: good  Hospital Course: Pt had an uneventful overnight stay. Pt tolerated po well. No bleeding. No stridor.  Consults: None  Significant Diagnostic Studies: None  Treatments: surgery: T&A  Discharge Exam: Blood pressure 115/77, pulse 80, temperature 98.2 F (36.8 C), temperature source Oral, resp. rate 19, weight (!) 145.6 kg, last menstrual period 03/13/2018, SpO2 97 %.    Disposition: Discharge disposition: 01-Home or Self Care       Discharge Instructions    Activity as tolerated - No restrictions   Complete by:  As directed    Diet general   Complete by:  As directed      Allergies as of 03/30/2018      Reactions   Citric Acid Hives, Itching   tongue    Codeine Swelling   Lips swelling was an orange pill.        Medication List    TAKE these medications   acetaminophen 500 MG tablet Commonly known as:  TYLENOL Take 1,000 mg by mouth 2 (two) times daily as needed for moderate pain or headache.   albuterol 108 (90 Base) MCG/ACT inhaler Commonly known as:  PROVENTIL HFA;VENTOLIN HFA INHALE TWO PUFFS INTO LUNGS EVERY 6 HOURS AS NEEDED FOR WHEEZING AND FOR SHORTNESS OF BREATH   amoxicillin 400 MG/5ML suspension Commonly known as:  AMOXIL Take 10 mLs (800 mg total) by mouth 2 (two) times daily for 5 days.   Cetirizine HCl 10 MG Caps Take 1 capsule (10 mg total) by mouth daily. What changed:    when to take this  reasons to take this   cycloSPORINE 0.05 % ophthalmic emulsion Commonly known as:  RESTASIS Place 1 drop into both eyes 2 (two) times daily.   lisinopril-hydrochlorothiazide 20-25 MG  tablet Commonly known as:  PRINZIDE,ZESTORETIC Take 1 tablet by mouth daily.   oxyCODONE 5 MG/5ML solution Commonly known as:  ROXICODONE Take 5-10 mLs (5-10 mg total) by mouth every 4 (four) hours as needed for severe pain.   PAZEO 0.7 % Soln Generic drug:  Olopatadine HCl Place 1 drop into both eyes daily.   phentermine 37.5 MG tablet Commonly known as:  ADIPEX-P Take 1 tablet (37.5 mg total) by mouth daily before breakfast.      Follow-up Information    Leta Baptist, MD In 2 weeks.   Specialty:  Otolaryngology Why:  As scheduled Contact information: [address] [place] [address] [phone]           Signed: Burley Saver 03/30/2018, 11:14 AM

## 2018-03-30 NOTE — Progress Notes (Signed)
Discharged home today.Prescription ,discharged instructions,personal belongings given to patient.Verbalized understanding of instructions

## 2018-04-24 ENCOUNTER — Other Ambulatory Visit: Payer: Self-pay | Admitting: Internal Medicine

## 2018-04-24 DIAGNOSIS — J4520 Mild intermittent asthma, uncomplicated: Secondary | ICD-10-CM

## 2018-05-22 ENCOUNTER — Encounter: Payer: Medicare Other | Admitting: Internal Medicine

## 2018-06-19 ENCOUNTER — Other Ambulatory Visit: Payer: Self-pay

## 2018-06-19 ENCOUNTER — Encounter: Payer: Self-pay | Admitting: Internal Medicine

## 2018-06-19 ENCOUNTER — Ambulatory Visit (INDEPENDENT_AMBULATORY_CARE_PROVIDER_SITE_OTHER): Payer: Medicare Other | Admitting: Internal Medicine

## 2018-06-19 DIAGNOSIS — Z79899 Other long term (current) drug therapy: Secondary | ICD-10-CM

## 2018-06-19 DIAGNOSIS — S46911A Strain of unspecified muscle, fascia and tendon at shoulder and upper arm level, right arm, initial encounter: Secondary | ICD-10-CM | POA: Insufficient documentation

## 2018-06-19 DIAGNOSIS — X58XXXA Exposure to other specified factors, initial encounter: Secondary | ICD-10-CM | POA: Diagnosis not present

## 2018-06-19 DIAGNOSIS — Z23 Encounter for immunization: Secondary | ICD-10-CM | POA: Diagnosis not present

## 2018-06-19 DIAGNOSIS — I10 Essential (primary) hypertension: Secondary | ICD-10-CM | POA: Diagnosis not present

## 2018-06-19 DIAGNOSIS — Z6841 Body Mass Index (BMI) 40.0 and over, adult: Secondary | ICD-10-CM | POA: Diagnosis not present

## 2018-06-19 DIAGNOSIS — Z975 Presence of (intrauterine) contraceptive device: Secondary | ICD-10-CM

## 2018-06-19 HISTORY — DX: Strain of unspecified muscle, fascia and tendon at shoulder and upper arm level, right arm, initial encounter: S46.911A

## 2018-06-19 MED ORDER — PHENTERMINE HCL 37.5 MG PO TABS: 37.5000 mg | ORAL_TABLET | Freq: Every day | ORAL | 0 refills | Status: DC

## 2018-06-19 MED ORDER — DICLOFENAC SODIUM 1 % TD GEL: 4.0000 g | Freq: Four times a day (QID) | TRANSDERMAL | 1 refills | Status: DC | PRN

## 2018-06-19 NOTE — Assessment & Plan Note (Signed)
Patient is complaining of right shoulder pain x1 week.  On exam range of motion is limited due to pain. Pain is reproducible with palpation of her deltoid, trapezius muscle, and pectoralis major. She denies injury, fall, or over use. Unclear reason for likely MSK strain. Has been carrying for her 37 year old nephew who she frequently picks up. Recommended conservative therapy with OTC NSAIDs as needed. Topical voltaren gel prn. Follow up if no improvement in 1-2 weeks.  -- Voltaren gel prn

## 2018-06-19 NOTE — Patient Instructions (Addendum)
Ms. Lezotte,  It was a pleasure to see you today. Please restart phentermine once daily. Follow up with me again in 4 weeks. If you have any questions or concerns, call our clinic at [phone] or after hours call [phone] and ask for the internal medicine resident on call. Thank you!  Dr. Philipp Ovens

## 2018-06-19 NOTE — Assessment & Plan Note (Signed)
Blood pressure is well controlled today.  Initially elevated 147/88 but improved on recheck to 111/59.  Continue to monitor while on phentermine. --Continue lisinopril-HCTZ 20-25 mg daily

## 2018-06-19 NOTE — Progress Notes (Signed)
   CC: Obesity follow up  HPI:  [name] is a 37 y.o. female with past medical history outlined below here for obesity follow up. For the details of today's visit, please refer to the assessment and plan.  Past Medical History:  Diagnosis Date  . Allergic rhinitis   . Arthritis    "RLE; broke my leg as a small child; no OR" (03/29/2018)  . Hyperlipidemia   . Hypertension   . Iron deficiency anemia   . Mild intermittent asthma   . Morbid obesity ([place])   . OSA on CPAP   . Vitamin D deficiency     Review of Systems  Musculoskeletal: Negative for falls.       Shoulder pain    Physical Exam:  Vitals:   06/19/18 1419 06/19/18 1438  BP: (!) 147/88 (!) 111/59  Pulse: 100 91  Temp: 98.1 F (36.7 C)   TempSrc: Oral   SpO2: 100%   Weight: (!) 337 lb 3.2 oz (153 kg)   Height: 4\' 11"  (1.499 m)     Constitutional: NAD, appears comfortable.  Cardiovascular: RRR, no murmurs, rubs, or gallops.  Pulmonary/Chest: CTAB, no wheezes, rales, or rhonchi.  Extremities: Warm and well perfused. No edema.  Psychiatric: Normal mood and affect  Assessment & Plan:   See Encounters Tab for problem based charting.  Patient discussed with Dr. Daryll Drown

## 2018-06-19 NOTE — Assessment & Plan Note (Signed)
Patient is here for weight loss follow-up.  She was previously on phentermine 37.5 mg daily and lost 25 pounds with concurrent diet and exercise.  Since stopping the medication, she has gained 15 pounds.  She continues to diet and exercise regularly.  She is interested in restarting the medication.  Blood pressure today is well controlled.  No pregnancy test needed, she has an IUD in place.  --Start phentermine 37.5 mg daily -- Follow up 1 month

## 2018-06-21 NOTE — Progress Notes (Signed)
Internal Medicine Clinic Attending  Case discussed with Dr. Guilloud at the time of the visit.  We reviewed the resident's history and exam and pertinent patient test results.  I agree with the assessment, diagnosis, and plan of care documented in the resident's note.  

## 2018-07-17 ENCOUNTER — Encounter: Payer: Medicare Other | Admitting: Internal Medicine

## 2018-08-22 DIAGNOSIS — G4733 Obstructive sleep apnea (adult) (pediatric): Secondary | ICD-10-CM | POA: Diagnosis not present

## 2018-08-29 ENCOUNTER — Other Ambulatory Visit: Payer: Self-pay | Admitting: Internal Medicine

## 2018-08-30 MED ORDER — PHENTERMINE HCL 37.5 MG PO TABS: 37.5000 mg | ORAL_TABLET | Freq: Every day | ORAL | 0 refills | Status: DC

## 2018-08-30 NOTE — Telephone Encounter (Signed)
Duplicate refill request-closed for administrative reason.Suzanne Lewis, Suzanne Halls Cassady1/29/20209:22 AM

## 2018-08-30 NOTE — Telephone Encounter (Signed)
Duplicate refill request-closed for administrative reasons.Suzanne Lewis, Suzanne Cirrincione Cassady1/29/20209:21 AM

## 2018-08-30 NOTE — Telephone Encounter (Signed)
Please have her schedule follow up with me. I have availability on the 24th of February. per Dr Philipp Ovens Thanks

## 2018-08-30 NOTE — Telephone Encounter (Signed)
Approved 1 month refill of phentermine. Please have her schedule follow up with me. I have availability on the 24th of February.

## 2018-08-31 NOTE — Telephone Encounter (Signed)
Per [facility] pt already has an appt scheduled 09/11/18 with Dr Philipp Ovens.

## 2018-09-11 ENCOUNTER — Encounter: Payer: Medicare Other | Admitting: Internal Medicine

## 2018-09-18 ENCOUNTER — Other Ambulatory Visit: Payer: Self-pay

## 2018-09-18 ENCOUNTER — Ambulatory Visit (INDEPENDENT_AMBULATORY_CARE_PROVIDER_SITE_OTHER): Payer: Medicare Other | Admitting: Internal Medicine

## 2018-09-18 ENCOUNTER — Encounter: Payer: Self-pay | Admitting: Internal Medicine

## 2018-09-18 DIAGNOSIS — I10 Essential (primary) hypertension: Secondary | ICD-10-CM | POA: Diagnosis not present

## 2018-09-18 DIAGNOSIS — Z6841 Body Mass Index (BMI) 40.0 and over, adult: Secondary | ICD-10-CM | POA: Diagnosis not present

## 2018-09-18 DIAGNOSIS — G4733 Obstructive sleep apnea (adult) (pediatric): Secondary | ICD-10-CM

## 2018-09-18 DIAGNOSIS — Z79899 Other long term (current) drug therapy: Secondary | ICD-10-CM

## 2018-09-18 NOTE — Assessment & Plan Note (Signed)
Blood pressure well controlled today, 122/65. -Continue lisinopril-HCTZ 20-25 mg daily

## 2018-09-18 NOTE — Progress Notes (Signed)
   CC: Weight loss follow up   HPI:  [name] is a 38 y.o. female with past medical history outlined below here for weight loss follow up. For the details of today's visit, please refer to the assessment and plan.  Past Medical History:  Diagnosis Date  . Allergic rhinitis   . Arthritis    "RLE; broke my leg as a small child; no OR" (03/29/2018)  . Hyperlipidemia   . Hypertension   . Iron deficiency anemia   . Mild intermittent asthma   . Morbid obesity ([place])   . OSA on CPAP   . Vitamin D deficiency     Review of Systems  Respiratory: Negative for shortness of breath.   Cardiovascular: Negative for chest pain.    Physical Exam:  Vitals:   09/18/18 1312 09/18/18 1343  BP: (!) 153/88 122/65  Pulse: (!) 108 97  Temp: 97.9 F (36.6 C)   TempSrc: Oral   SpO2: 99%   Weight: (!) 341 lb 8 oz (154.9 kg)   Height: 4\' 11"  (1.499 m)     Constitutional: NAD, appears comfortable.  Cardiovascular: RRR, no murmurs, rubs, or gallops.  Pulmonary/Chest: CTAB, no wheezes, rales, or rhonchi.  Extremities: Warm and well perfused. No edema.  Psychiatric: Normal mood and affect  Assessment & Plan:   See Encounters Tab for problem based charting.  Patient discussed with Dr. Angelia Mould

## 2018-09-18 NOTE — Assessment & Plan Note (Signed)
Patient is here for weight loss follow-up.  She is currently taking phentermine 37.5 mg daily.  This is her second trial of this medication.  She was initially started on this medication a year ago and was able to achieve a 25 pound weight loss. She was also actively dieting and exercising.  Patient stopped the medication after a few months, then requested to restart again after gaining weight.  She reports it suppresses her appetite, however she has continued to gain weight over the past few months despite phentermine.  She is up 20 pounds in the past 6 months.  She has continued to diet but has had a decline in her physical activity recently.  Given that the medication is no longer working, she is agreeable to stopping phentermine today.  We discussed other options including referral for bariatric surgery.  Her BMI is almost 69.  She is young with few chronic medical problems aside from hypertension and obstructive sleep apnea which are well controlled.  Patient would like to do research and will consider it.  She was provided with educational handout today.  Will call patient with information about bariatric surgery seminar at [facility] long. -- Stop phentermine  -- Referral to MNT -- Consider referral for bariatric surgery  -- Follow up 3 months

## 2018-09-18 NOTE — Patient Instructions (Addendum)
Suzanne Lewis,  It was a pleasure to see you. I would like for you to stop taking the phentermine. Continue to take your other medications as previously prescribed. I have referred you to meet with Butch Penny, our dietitian again.   Please follow up with me again in 3 months or sooner if you have any problems. If you have any questions or concerns, call our clinic at [phone] or after hours call [phone] and ask for the internal medicine resident on call. Thank you!  Dr. Philipp Ovens    Bariatric Surgery Information Bariatric surgery, also called weight loss surgery, is a procedure that helps you lose weight. You may consider, or your health care provider may suggest, bariatric surgery if:  You are severely obese and have been unable to lose weight through diet and exercise.  You have health problems related to obesity, such as: ? Type 2 diabetes. ? Heart disease. ? Lung disease. How does bariatric surgery help me lose weight? Bariatric surgery helps you lose weight by:  Decreasing how much food your body absorbs. This is done by closing off part of your stomach to make it smaller. This restricts the amount of food your stomach can hold.  Changing your body's regular digestive process so that food bypasses the parts of your body that absorb calories and nutrients. If you decide to have bariatric surgery, it is important to continue to eat a healthy diet and exercise regularly after the surgery. What are the different kinds of bariatric surgery? There are two kinds of bariatric surgeries:  Restrictive surgery. This procedure makes your stomach smaller. It does not change your digestive process. The smaller the size of your new stomach, the less food you can eat. There are different types of restrictive surgeries.  Malabsorptive surgery. This procedure makes your stomach smaller and alters your digestive process so that your body processes less calories and nutrients. These are the most common  kind of bariatric surgery. There are different types of malabsorptive surgeries. What are the different types of restrictive surgery? Adjustable Gastric Banding In this procedure, an inflatable band is placed around your stomach near the upper end. This makes the passageway for food into the rest of your stomach much smaller. The band can be adjusted, making it tighter or looser, by filling it with salt solution. Your surgeon can adjust the band based on how you are feeling and how much weight you are losing. The band can be removed in the future. This requires another surgery. Vertical Banded Gastroplasty In this procedure, staples are used to separate your stomach into two parts, a small upper pouch and a bigger lower pouch. This decreases how much food you can eat. Sleeve Gastrectomy In this procedure, your stomach is made smaller. This is done by surgically removing a large part of your stomach. When your stomach is smaller, you feel full more quickly and reduce how much you eat. What are the different types of malabsorptive surgery?  Roux-en-Y Gastric Bypass (RGB) This is the most common weight loss surgery. In this procedure, a small stomach pouch is created in the upper part of your stomach. Next, this small stomach pouch is attached directly to the middle part of your small intestine. The farther down your small intestine the new connection is made, the fewer calories and nutrients you will absorb. This surgery has the highest rate of complications. Biliopancreatic Diversion with Duodenal Switch (BPD/DS) This is a multi-step procedure. First, a large part of your stomach is removed,  making your stomach smaller. Next, this smaller stomach is attached to the lower part of your small intestine. Like the RGB surgery, you absorb fewer calories and nutrients the farther down your small intestine the attachment is made. What are the risks of bariatric surgery? As with any surgical procedure, each  type of bariatric surgery has its own risks. These risks also depend on your age, your overall health, and any other medical conditions you may have. When deciding on bariatric surgery, it is very important to:  Talk to your health care provider and choose the surgery that is best for you.  Ask your health care provider about specific risks for the surgery you choose. Generally, the risks of bariatric surgery include:  Infection.  Bleeding.  Not getting enough nutrients from food (nutritional deficiencies).  Failure of the device or procedure. This may require another surgery to correct the problem. Where to find more information  American Society for Metabolic & Bariatric Surgery: www.asmbs.org  Weight-control Information Network (WIN): win.AmenCredit.is Summary  Bariatric surgery, also called weight loss surgery, is a procedure that helps you lose weight.  This surgery may be recommended if you have diabetes, heart disease, or lung disease.  Generally, risks of bariatric surgery include infection, bleeding, and failure of the surgery or device, which may require another surgery to correct the problem. This information is not intended to replace advice given to you by your health care provider. Make sure you discuss any questions you have with your health care provider. Document Released: 07/19/2005 Document Revised: 08/23/2016 Document Reviewed: 08/23/2016 Elsevier Interactive Patient Education  2019 [employer].

## 2018-09-19 NOTE — Progress Notes (Signed)
Internal Medicine Clinic Attending  Case discussed with Dr. Guilloud at the time of the visit.  We reviewed the resident's history and exam and pertinent patient test results.  I agree with the assessment, diagnosis, and plan of care documented in the resident's note.  

## 2018-09-30 IMAGING — US US TRANSVAGINAL NON-OB
1 series · 15 of 25 positions shown · non-contrast
Comparison: [DATE] 10/20/2003

CLINICAL DATA: Abnormal uterine bleeding for 3 weeks. Bleeding
stopped with Megace. Previous C-section. Obesity. LMP 09/29/2016.



[Series 1: us transvaginal non-ob · 15 of 39 slices shown]
[im 1/39]
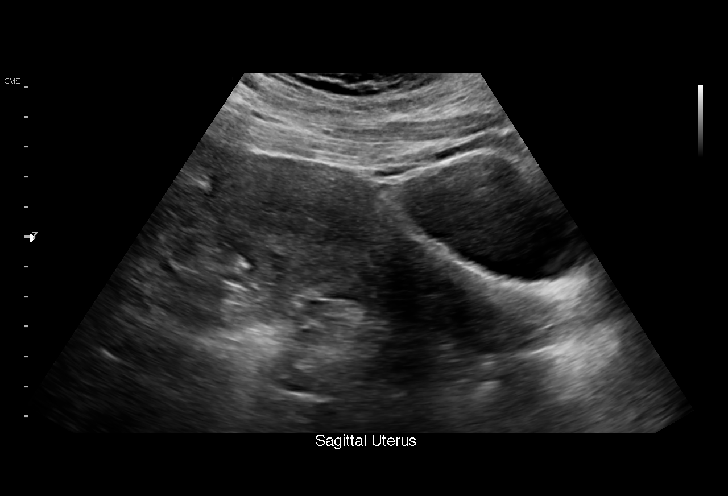
[im 4/39]
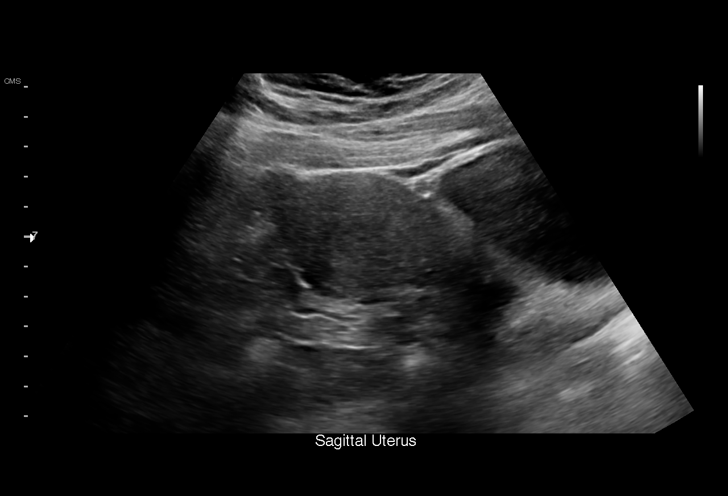
[im 7/39]
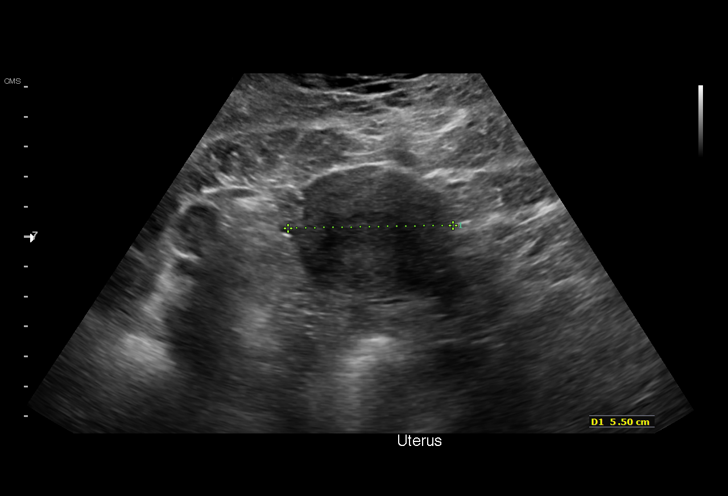
[im 8/39]
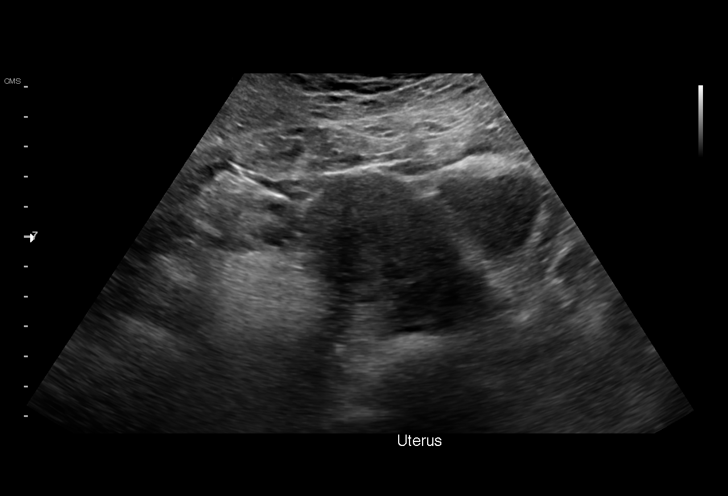
[im 12/39]
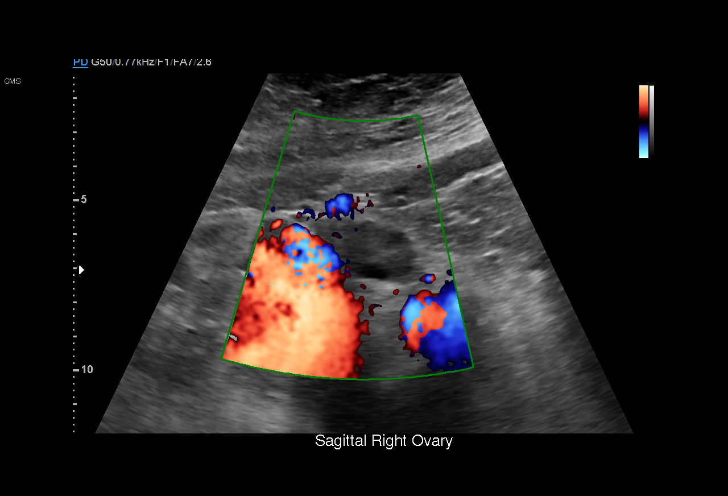
[im 15/39]
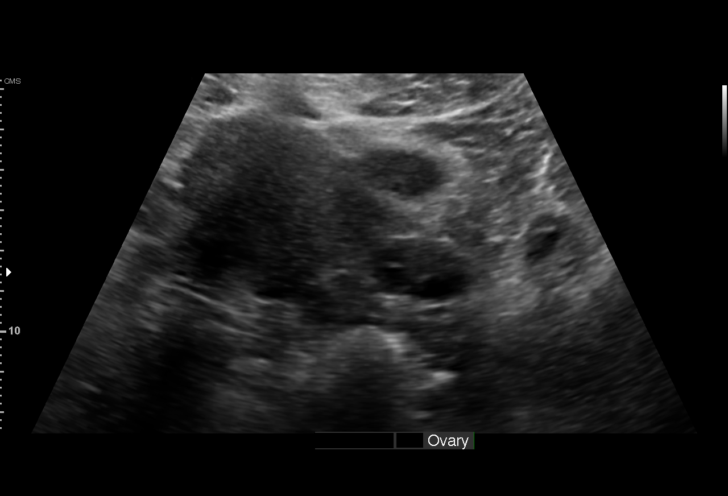
[im 16/39]
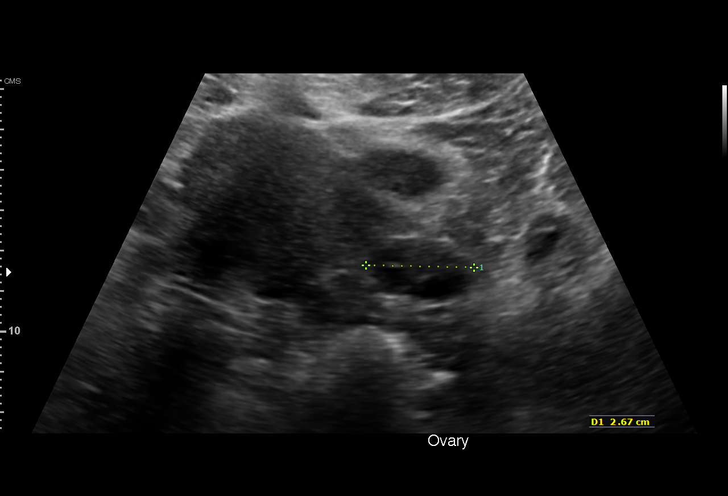
[im 20/39]
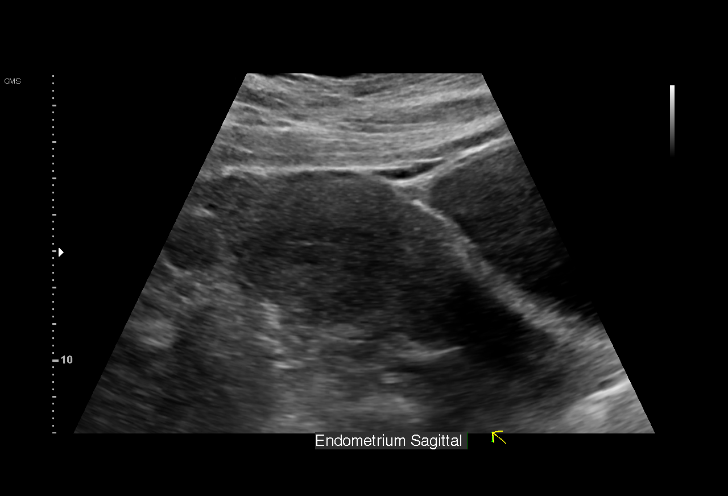
[im 23/39]
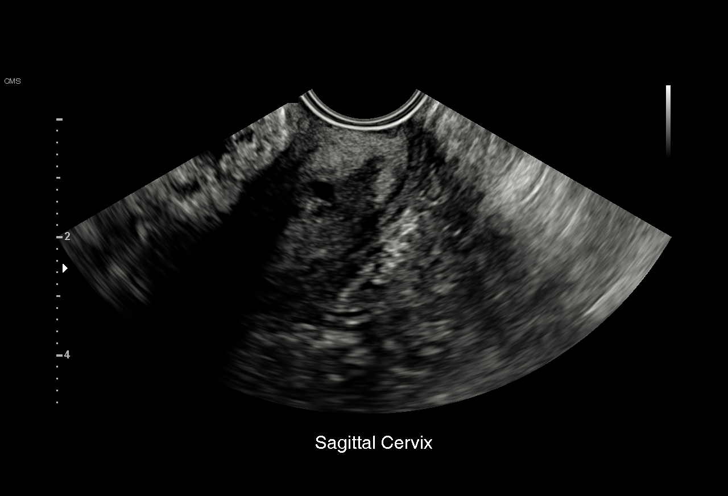
[im 24/39]
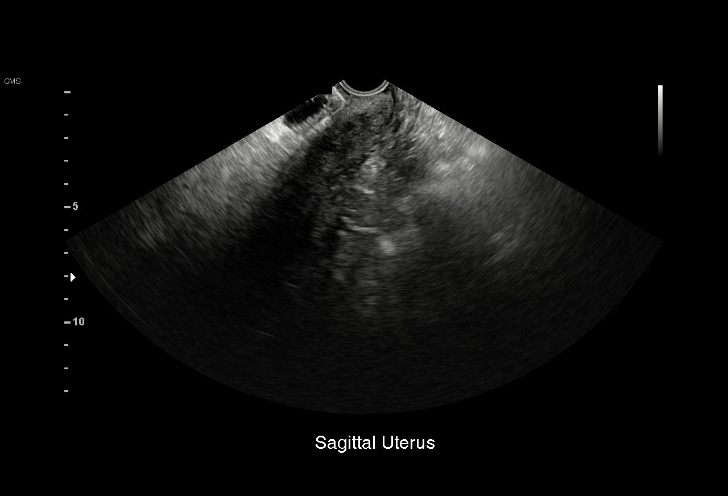
[im 27/39]
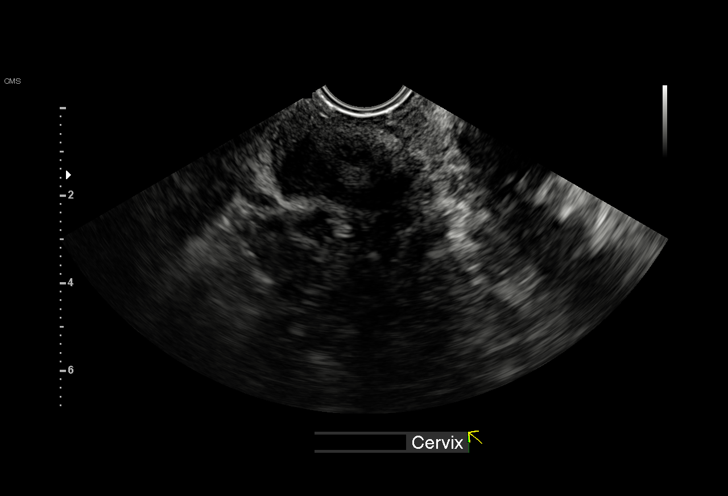
[im 31/39]
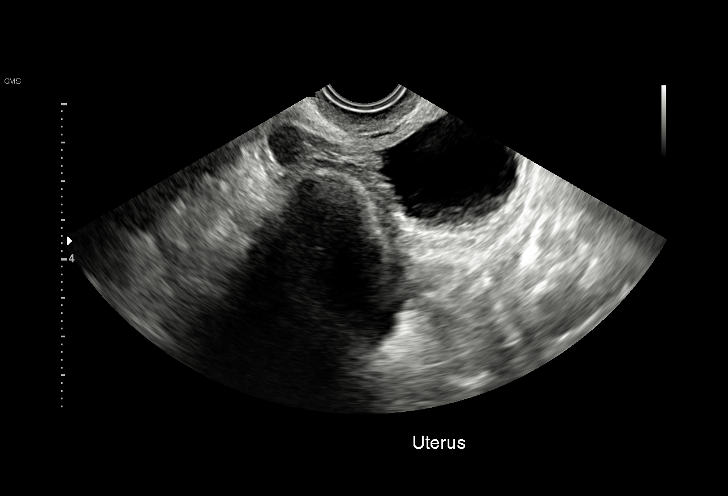
[im 32/39]
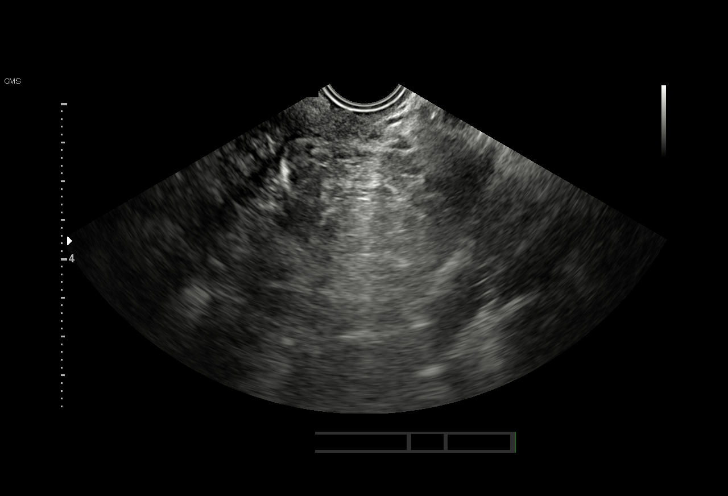
[im 35/39]
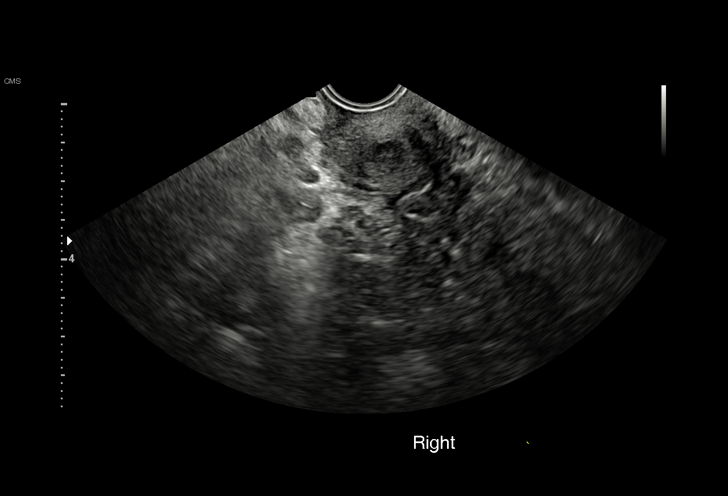
[im 39/39]
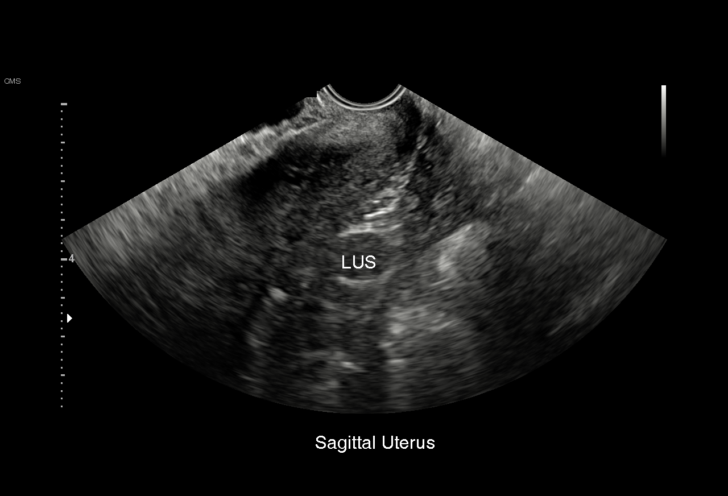

[15 of 25 positions shown; findings below may reference images not displayed]

FINDINGS: Uterus

Measurements: At least 11.9 x 4.4 x 5.5 cm. No fibroids or other
mass visualized.

Endometrium

Thickness: 2.0 mm. Only seen on transabdominal portion of the exam.
No focal endometrial mass identified.

Right ovary

Measurements: 3.5 x 1.8 x 8 2.5 cm.. Normal appearance/no adnexal
mass.

Left ovary

Measurements: 3.0 x 1.7 x 2.7 cm. Normal appearance/no adnexal mass.

Other findings

No abnormal free fluid.
IMPRESSION: 1. Enlarged uterus.
2. Normal appearance of the endometrial stripe. If bleeding remains
unresponsive to hormonal or medical therapy, sonohysterogram should
be considered for focal lesion work-up. (Ref: Radiological
Reasoning: Algorithmic Workup of Abnormal Vaginal Bleeding with
Endovaginal Sonography and Sonohysterography. AJR [id number]; 191:S68-73)
3. Normal appearance of both ovaries.

## 2018-10-16 ENCOUNTER — Encounter: Payer: Medicare Other | Admitting: Dietician

## 2018-10-17 ENCOUNTER — Telehealth: Payer: Self-pay | Admitting: Dietician

## 2018-10-17 NOTE — Telephone Encounter (Signed)
Spoke with patient about appointment cancellations. Answered her questions and concerns about diet and nutrition for weight loss.  Debera Lat, RD 10/17/2018 10:19 AM.

## 2018-11-20 ENCOUNTER — Other Ambulatory Visit: Payer: Self-pay

## 2018-11-20 ENCOUNTER — Ambulatory Visit: Payer: Medicare Other | Admitting: Dietician

## 2018-11-20 ENCOUNTER — Encounter: Payer: Self-pay | Admitting: Dietician

## 2018-11-20 NOTE — Progress Notes (Signed)
Called Suzanne Lewis to reschedule missed appointment for weight loss/ nutrition support. She wants/agrees to a telephone appointment on Friday 11/24/18 at 10:15.

## 2018-11-24 ENCOUNTER — Ambulatory Visit: Payer: Medicare Other | Admitting: Dietician

## 2018-11-24 ENCOUNTER — Encounter: Payer: Self-pay | Admitting: Dietician

## 2018-11-24 ENCOUNTER — Other Ambulatory Visit: Payer: Self-pay

## 2018-11-24 NOTE — Progress Notes (Addendum)
Telephone visit due to COVID-19.  I connected with  RUBYE STROHMEYER on 11/24/18 by phone and verified that I am speaking with the correct person using two identifiers.  I discussed the limitations of evaluation and management by telemedicine. The patient expressed understanding and agreed to proceed.  Intensive Behavioral Therapy for Obesity Session # 1  Start time:1040      End time: 1100  24-hr recall suggests intake of ~1800 kcal:  1-2 months eating like this: (Up at  AM) 7 am B ( 9 AM)- Skips breakfast 1-2x per week  2 bottles of water, no soda, cranberry juice (1/2 cup 1x/week) and water, 1/2 bagel medium sized bagel, lowfat cr cheese Cleans house  L ( 1 PM)- ham wrap- medium sized with  lettuce,  ham, cheese, onions, pickles, water, fruit, pita chips D ( 7 PM)- 2 cups vegetables, 1/2 cup rice, wings x1 chicken in the air fryer, cabbage, string beans, water Snk ( PM)- yogurt  Pita chips with cheese dip, water Bedtime- 9 - 10 pm, sleeps off and on, takes care of niece 18 months old twice a week, 2-3x/night cleans for 1 hours, sleep apnea, denies eating at night cpap- 3 years- every 3 months new face mask, sleeps longer with it, 4 nights/week Typical day? Yes.   for the past 1-2 months  Benefits of changes she has made- she has noticed more energy;  Weighs at home- yes, every two weeks- 322# She deines barriers of transportation or financial ability to purchase the foods she needs and wants.  Sees as one of her Problems- skipping breakfast because it gives her headaches.  1- Assessment:  Factors affecting behavior change goals: Support in her home environment and health care literacy may be playing a role in her lack of success/barriers.  Reasons for desiring weight loss: for her "Health"- prevent getting diabetes and so her legs won't hit each other.  [name] reports exercising 2-3x/week 30-60 minutes by taking walks and eating healthy foods and portions fpr the past 1-2 months. She  reports eight loss back to her baseline during this time. She was taken off weight loss medication recently due to lack of efficacy. She has not considered bariatric surgery due to family members experiences and warnings. I urged her to increase her knowledge of this surgery by attending the seminar.  2- Advice: Behavior change advice: continue being more active and eating healthy. identify barriers that can get in your way of making changes. Consider education about bariatric surgery, weigh at least one time every week.   Health harms and benefits: less headaches, decrease risk of diabetes  3- Agreement on Goals: For next visit: go online to look at information about bariatric surgery, write down what is happening on days you eat breakfast vs days you do not.   4- Assist: with goal setting using motivational interveiwing   5- Arrange follow up: 1 month per patient; plan to encourage more frequent visits if she is agreeable and motivated.  Debera Lat, RD 11/24/2018 2:27 PM.

## 2018-11-24 NOTE — Patient Instructions (Addendum)
Suzanne Lewis,   Consider buying a notebook at the dollar store to keep your weight loss records in.  - I suggest weighing yourself every week.  - It would be helpful to keep track of your weigh TREND so you know if what you are doing is working.  You can keep that in your weight loss book as well.   - Also, you can use this book to write down what you are doing the 8 hours before skipping breakfast and how you feel the 8 hours after skipping breakfast.   Her is the information about :  Bariatric Surgery  You have so much to gain by losing weight.  You may have already tried every diet and exercise plan imaginable.  And, you may have sought advice from your family physician, too.   Sometimes, in spite of such diligent efforts, you may not be able to achieve long-term results by yourself.  In cases of severe obesity, bariatric or weight loss surgery is a proven method of achieving long-term weight control.  Our Services Our bariatric surgery programs offer our patients new hope and long-term weight-loss solution.  Since introducing our services in 2003, we have conducted more than 2,400 successful procedures.  Our program is designated as a Programmer, multimedia by the Metabolic and Bariatric Surgery Accreditation and Quality Improvement Program (MBSAQIP), a IT trainer that sets rigorous patient safety and outcome standards.  Our program is also designated as a Ecologist by [employer].   Our exceptional weight-loss surgery team specializes in diagnosis, treatment, follow-up care, and ongoing support for our patients with severe weight loss challenges.  We currently offer laparoscopic sleeve gastrectomy, gastric bypass, and adjustable gastric band (LAP-BAND).    Attend our [place] Choosing to undergo a bariatric procedure is a big decision, and one that should not be taken lightly.  You now have two options in how you learn about weight-loss surgery - in  person or online.  Our objective is to ensure you have all of the information that you need to evaluate the advantages and obligations of this life changing procedure.  Please note that you are not alone in this process, and our experienced team is ready to assist and answer all of your questions.  There are several ways to register for a seminar (either on-line or in person): 1)  Call [phone] 2) Go on-line to [facility] and register for either type of seminar.  [email or web address]

## 2018-12-26 ENCOUNTER — Ambulatory Visit: Payer: Medicare Other | Admitting: Dietician

## 2018-12-26 ENCOUNTER — Encounter: Payer: Self-pay | Admitting: Dietician

## 2018-12-26 NOTE — Progress Notes (Signed)
Telephone visit due to COVID-19.  I connected with  Suzanne Lewis on 12/26/18 by phone and verified that I am speaking with the correct person using two identifiers.  I discussed the limitations of evaluation and management by telemedicine. The patient expressed understanding and agreed to proceed.  Intensive Behavioral Therapy for Obesity Session # 2  Start [id number]     End time: 6384  Total time: 15 minutes  ACTIVITY- Walking- apartment 2-3 days for 15 minutes. Her spouse walks with her.  Dietary Intake- 24-hr recall suggests intake of ~1500 kcal:  1-2 months eating like this: (Up at  AM) 7 am B ( 9 AM)-    water, 1/2 bagel medium sized bagel, 2 strp bacon, lowfat cr cheese sometimes L ( 12 PM)- small wrap- medium sized with  lettuce,  ham, cheese, onions, pickles, water, OR pepperoni, apples, wheat bread 3 pm-fruit D ( 7 PM)-  mashed potatoes, baked chicken, 1 cup broccoli, fruit water Snk ( PM)- yogurt  Bedtime- 9 - 10 pm, sleeping through the night (cpap- sleeps with it all night for 5 nights/week) Typical day? Yes.   for the past 1-2 months  Benefits of changes she has made- she has noticed more energy;  Weight at home- 320#   Sees as one of her Problems- skipping breakfast because it gives her headaches.- is no longer skipping  1- Assessment:  Factors affecting behavior change goal: .  Reasons for desiring weight loss: for her "Health"- prevent getting diabetes and so her legs won't hit each other.  [name] reports exercising 2-3x/week 15 minutes and eating healthy foods and portions fpr the past 1-2 months. She reports weight loss of 2#. She says she was unable to sign up for bariatric seminar.  2- Advice: Behavior change advice: continue being more active with goal of 150 minutes per week. Increase this month to 90 minutes per week.    3- Agreement on Goals: For next visit: increase walking to 90 minutes per week.    4- Assist: with goal setting using motivational  interveiwing   5- Arrange follow up: 1 month by phone per patient request Debera Lat, RD 12/26/2018 10:50 AM.

## 2019-01-16 DIAGNOSIS — G4733 Obstructive sleep apnea (adult) (pediatric): Secondary | ICD-10-CM | POA: Diagnosis not present

## 2019-01-25 ENCOUNTER — Other Ambulatory Visit: Payer: Self-pay

## 2019-01-25 ENCOUNTER — Ambulatory Visit: Payer: Medicare Other | Admitting: Dietician

## 2019-01-25 NOTE — Progress Notes (Signed)
Telephone visit due to COVID-19.Left voicemail for return call Debera Lat, RD 01/25/2019 10:57 AM.  Telephone visit due to COVID-19.  I connected with  Suzanne Lewis on 02/06/19 by phone and verified that I am speaking with the correct person using two identifiers.  I discussed the limitations of evaluation and management by telemedicine. The patient expressed understanding and agreed to proceed.  Intensive Behavioral Therapy for Obesity Session # 3  Start time:1025     End time: 5462  Total time: 21 minutes  ACTIVITY- Walking or exercise bike daily for 45 minutes. Her spouse walks with her.  Dietary Intake- 24-hr recall suggests intake of ~1500 kcal:  2-3 months eating  Lower calorie.   Benefits of changes she has made- she has noticed more energy;  skipping breakfast because it gives her headaches.- is no longer a problem  1- Assessment:  Factors affecting behavior change goal: .  Goals- exercise  90 minutes a week- rates herself an "A" at doing this Weighs daily- 309# Sleep- "better", bedtime 10 pm, arise 7 am , cpap- 1 year now, helped reduce headaches, not interested in bariatric surgery Food intake- Eating more salads, 1- 2 days she eats breakfast, 11-12 noon, 6-7 pm cucumber with vinegar, [place], croutons, fruit salad,  Support for activity- niece in her stroller Barriers- weather for exercsie, when it is raining or too hot she uses exercise bike, 2x/week.   2- Advice: Behavior change advice:keep up the current behaviors of weighing and exercise daily, eating low calorie foods and beverages  3- Agreement on Goals: weigh and exercise daily.    4- Assist: assess beverages and reasons for weigh loss at next visit   5- Arrange follow up: 1 month by phone per patient request Debera Lat, RD 02/06/2019 10:45 AM.

## 2019-03-06 ENCOUNTER — Telehealth: Payer: Self-pay | Admitting: Dietician

## 2019-03-06 ENCOUNTER — Ambulatory Visit: Payer: Medicare Other | Admitting: Dietician

## 2019-03-06 NOTE — Telephone Encounter (Signed)
Ms.Dancey was not available for her appointment this morning. I left her a message to call me or the front office to reschedule. Debera Lat, RD 03/06/2019 9:24 AM.

## 2019-03-09 ENCOUNTER — Encounter: Payer: Self-pay | Admitting: Dietician

## 2019-03-09 NOTE — Telephone Encounter (Signed)
I have been unable to reach this patient by phone.  A letter is being sent. Debera Lat, RD 03/09/2019 4:02 PM.

## 2019-04-10 ENCOUNTER — Other Ambulatory Visit: Payer: Self-pay | Admitting: *Deleted

## 2019-04-10 DIAGNOSIS — J4520 Mild intermittent asthma, uncomplicated: Secondary | ICD-10-CM

## 2019-04-10 DIAGNOSIS — I10 Essential (primary) hypertension: Secondary | ICD-10-CM

## 2019-04-10 MED ORDER — LISINOPRIL-HYDROCHLOROTHIAZIDE 20-25 MG PO TABS: 1.0000 | ORAL_TABLET | Freq: Every day | ORAL | 3 refills | Status: DC

## 2019-04-10 MED ORDER — PROAIR HFA 108 (90 BASE) MCG/ACT IN AERS: INHALATION_SPRAY | RESPIRATORY_TRACT | 2 refills | Status: DC

## 2019-04-10 NOTE — Telephone Encounter (Signed)
New Investment banker, corporate.  Please provide 44mth supply if appropriate.Despina Hidden Cassady9/8/20204:41 PM

## 2019-04-13 DIAGNOSIS — G4733 Obstructive sleep apnea (adult) (pediatric): Secondary | ICD-10-CM | POA: Diagnosis not present

## 2019-05-02 ENCOUNTER — Encounter: Payer: Medicare Other | Admitting: Internal Medicine

## 2019-06-01 ENCOUNTER — Other Ambulatory Visit: Payer: Self-pay | Admitting: Internal Medicine

## 2019-06-01 DIAGNOSIS — J4520 Mild intermittent asthma, uncomplicated: Secondary | ICD-10-CM

## 2019-07-12 DIAGNOSIS — G4733 Obstructive sleep apnea (adult) (pediatric): Secondary | ICD-10-CM | POA: Diagnosis not present

## 2019-07-20 ENCOUNTER — Other Ambulatory Visit: Payer: Self-pay

## 2019-07-20 NOTE — Patient Outreach (Signed)
[facility]) Care Management  07/20/2019  Suzanne Lewis [birth year] [id number]   Medication Adherence call to Suzanne Lewis [facility] Identifiers Verify spoke with patient she is past due on Lisinopril/Hctz 20/25 mg,patient explain she takes 1 tablet daily,patiet has medication until she receives the mail order, patient ask to call [facility] to order this medication,[facility] will mail out with in 5-7 days . Suzanne Lewis is showing past due under [place].  [place] Management Direct Dial [phone]  Fax [phone] [email or web address]

## 2019-09-04 ENCOUNTER — Other Ambulatory Visit: Payer: Self-pay | Admitting: Internal Medicine

## 2019-09-04 DIAGNOSIS — J4520 Mild intermittent asthma, uncomplicated: Secondary | ICD-10-CM

## 2019-09-27 ENCOUNTER — Encounter: Payer: Self-pay | Admitting: *Deleted

## 2019-09-27 NOTE — Progress Notes (Signed)

## 2019-09-27 NOTE — Progress Notes (Signed)
Things That May Be Affecting Your Health:  Alcohol  Hearing loss  Pain    Depression  Home Safety  Sexual Health   Diabetes  Lack of physical activity  Stress   Difficulty with daily activities  Loneliness  Tiredness   Drug use  Medicines  Tobacco use   Falls  Motor Vehicle Safety  Weight   Food choices  Oral Health  Other    YOUR PERSONALIZED HEALTH PLAN : 1. Schedule your next subsequent Medicare Wellness visit in one year 2. Attend all of your regular appointments to address your medical issues 3. Complete the preventative screenings and services   Annual Wellness Visit   Medicare Covered Preventative Screenings and [place] Men and Women Who How Often Need? Date of Last Service Action  Abdominal Aortic Aneurysm Adults with AAA risk factors Once No    Alcohol Misuse and Counseling All Adults Screening once a year if no alcohol misuse. Counseling up to 4 face to face sessions. No    Bone Density Measurement  Adults at risk for osteoporosis Once every 2 yrs No    Lipid Panel Z13.6 All adults without CV disease Once every 5 yrs Yes    Colorectal Cancer   Stool sample or  Colonoscopy All adults 48 and older   Once every year  Every 10 years No    Depression All Adults Once a year Yes Today   Diabetes Screening Blood glucose, post glucose load, or GTT Z13.1  All adults at risk  Pre-diabetics  Once per year  Twice per year Yes    Diabetes  Self-Management Training All adults Diabetics 10 hrs first year; 2 hours subsequent years. Requires Copay No    Glaucoma  Diabetics  Family history of glaucoma  African Americans 59 yrs +  Hispanic Americans 47 yrs + Annually - requires coppay No    Hepatitis C Z72.89 or F19.20  High Risk for HCV  Born between 1945 and 1965  Annually  Once No    HIV Z11.4 All adults based on risk  Annually btw ages 71 & 42 regardless of risk  Annually > 65 yrs if at increased risk Yes    Lung Cancer Screening  Asymptomatic adults aged 8-77 with 30 pack yr history and current smoker OR quit within the last 15 yrs Annually Must have counseling and shared decision making documentation before first screen No    Medical Nutrition Therapy Adults with   Diabetes  Renal disease  Kidney transplant within past 3 yrs 3 hours first year; 2 hours subsequent years No    Obesity and Counseling All adults Screening once a year Counseling if BMI 30 or higher Yes Today   Tobacco Use Counseling Adults who use tobacco  Up to 8 visits in one year     Vaccines Z23  Hepatitis B  Influenza   Pneumonia  Adults   Once  Once every flu season  Two different vaccines separated by one year Yes - Flu shot    Next Annual Wellness Visit People with Medicare Every year  Today     Services & Screenings Women Who How Often Need  Date of Last Service Action  Mammogram  Z12.31 Women over 24 One baseline ages 50-39. Annually ager 61 yrs+ No    Pap tests All women Annually if high risk. Every 2 yrs for normal risk women Yes 10/04/16   Screening for cervical cancer with   Pap (Z01.419 nl or  Z01.411abnl) &  HPV Z11.51 Women aged 62 to 61 Once every 5 yrs     Screening pelvic and breast exams All women Annually if high risk. Every 2 yrs for normal risk women Yes    Sexually Transmitted Diseases  Chlamydia  Gonorrhea  Syphilis All at risk adults Annually for non pregnant females at increased risk No

## 2019-10-10 DIAGNOSIS — G4733 Obstructive sleep apnea (adult) (pediatric): Secondary | ICD-10-CM | POA: Diagnosis not present

## 2019-11-22 ENCOUNTER — Ambulatory Visit: Payer: Medicare Other | Admitting: Internal Medicine

## 2019-11-22 ENCOUNTER — Other Ambulatory Visit: Payer: Self-pay

## 2020-01-08 DIAGNOSIS — G4733 Obstructive sleep apnea (adult) (pediatric): Secondary | ICD-10-CM | POA: Diagnosis not present

## 2020-03-01 ENCOUNTER — Other Ambulatory Visit: Payer: Self-pay | Admitting: Internal Medicine

## 2020-03-01 DIAGNOSIS — J4520 Mild intermittent asthma, uncomplicated: Secondary | ICD-10-CM

## 2020-03-04 NOTE — Telephone Encounter (Signed)
Pt appt 03/19/2020 @10 :15.

## 2020-03-19 ENCOUNTER — Encounter: Payer: Medicare Other | Admitting: Internal Medicine

## 2020-03-26 ENCOUNTER — Other Ambulatory Visit: Payer: Self-pay | Admitting: Internal Medicine

## 2020-03-26 DIAGNOSIS — I10 Essential (primary) hypertension: Secondary | ICD-10-CM

## 2020-04-08 DIAGNOSIS — G4733 Obstructive sleep apnea (adult) (pediatric): Secondary | ICD-10-CM | POA: Diagnosis not present

## 2020-04-09 ENCOUNTER — Other Ambulatory Visit: Payer: Self-pay

## 2020-04-09 ENCOUNTER — Ambulatory Visit (INDEPENDENT_AMBULATORY_CARE_PROVIDER_SITE_OTHER): Payer: Medicare Other | Admitting: Internal Medicine

## 2020-04-09 ENCOUNTER — Ambulatory Visit (HOSPITAL_COMMUNITY)
Admission: RE | Admit: 2020-04-09 | Discharge: 2020-04-09 | Disposition: A | Discharge status: Home / Self Care | Payer: Medicare Other | Source: Ambulatory Visit | Attending: Internal Medicine | Admitting: Internal Medicine

## 2020-04-09 ENCOUNTER — Encounter: Payer: Self-pay | Admitting: Internal Medicine

## 2020-04-09 VITALS — BP 161/107 | HR 130 | Wt 383.5 lb

## 2020-04-09 DIAGNOSIS — Z0001 Encounter for general adult medical examination with abnormal findings: Secondary | ICD-10-CM

## 2020-04-09 DIAGNOSIS — R7309 Other abnormal glucose: Secondary | ICD-10-CM

## 2020-04-09 DIAGNOSIS — I10 Essential (primary) hypertension: Secondary | ICD-10-CM | POA: Diagnosis not present

## 2020-04-09 DIAGNOSIS — G4733 Obstructive sleep apnea (adult) (pediatric): Secondary | ICD-10-CM

## 2020-04-09 DIAGNOSIS — E785 Hyperlipidemia, unspecified: Secondary | ICD-10-CM | POA: Diagnosis not present

## 2020-04-09 DIAGNOSIS — Z0000 Encounter for general adult medical examination without abnormal findings: Secondary | ICD-10-CM

## 2020-04-09 DIAGNOSIS — Z23 Encounter for immunization: Secondary | ICD-10-CM | POA: Diagnosis not present

## 2020-04-09 DIAGNOSIS — R000 Tachycardia, unspecified: Secondary | ICD-10-CM

## 2020-04-09 DIAGNOSIS — E1165 Type 2 diabetes mellitus with hyperglycemia: Secondary | ICD-10-CM | POA: Diagnosis not present

## 2020-04-09 DIAGNOSIS — Z7984 Long term (current) use of oral hypoglycemic drugs: Secondary | ICD-10-CM

## 2020-04-09 DIAGNOSIS — J4520 Mild intermittent asthma, uncomplicated: Secondary | ICD-10-CM

## 2020-04-09 DIAGNOSIS — Z9089 Acquired absence of other organs: Secondary | ICD-10-CM

## 2020-04-09 MED ORDER — PROAIR HFA 108 (90 BASE) MCG/ACT IN AERS
1.0000 | INHALATION_SPRAY | Freq: Four times a day (QID) | RESPIRATORY_TRACT | 3 refills | Status: DC | PRN
Start: 2020-04-09 — End: 2021-01-02

## 2020-04-09 MED ORDER — LISINOPRIL-HYDROCHLOROTHIAZIDE 20-25 MG PO TABS: 1.0000 | ORAL_TABLET | Freq: Every day | ORAL | 3 refills | Status: DC

## 2020-04-09 NOTE — Patient Instructions (Signed)
Thank you for allowing [place] to provide your care today. Today we discussed your hypertension and elevated heart rate   I have ordered the following labs for you:    I will call if any are abnormal.    Today we made the following changes to your medications:    Please follow-up in one week to reassess heart rate.    Please call the internal medicine center clinic if you have any questions or concerns, we may be able to help and keep you from a long and expensive emergency room wait. Our clinic and after hours phone number is [phone], the best time to call is Monday through Friday 9 am to 4 pm but there is always someone available 24/7 if you have an emergency. If you need medication refills please notify your pharmacy one week in advance and they will send [place] a request.

## 2020-04-09 NOTE — Progress Notes (Signed)
   CC: hypertension  HPI:  [name] is a 39 y.o. with PMH as below.   Please see A&P for assessment of the patient's acute and chronic medical conditions.   Past Medical History:  Diagnosis Date  . Allergic rhinitis   . Arthritis    "RLE; broke my leg as a small child; no OR" (03/29/2018)  . Hyperlipidemia   . Hypertension   . Iron deficiency anemia   . Mild intermittent asthma   . Morbid obesity ([place])   . OSA on CPAP   . Vitamin D deficiency    Review of Systems:  Review of Systems  Constitutional: Negative for chills, diaphoresis, fever, malaise/fatigue and weight loss.  Eyes: Negative for blurred vision and double vision.  Respiratory: Negative for cough, shortness of breath and wheezing.   Cardiovascular: Negative for chest pain, palpitations and leg swelling.  Gastrointestinal: Negative for abdominal pain, diarrhea, nausea and vomiting.  Genitourinary: Negative for dysuria and frequency.  Neurological: Negative for dizziness, weakness and headaches.   Physical Exam:  Constitution: NAD, morbidly obese HENT: [place], AT Cardio: tachycardic, regular rhythm, no m/r/g, no LE edema  Respiratory: CTA, no w/r/r Abdominal: NTTP, soft, non-distended Neuro: normal affect, a&ox3 Skin: chronic LLE hypopigmentation, scaling   Vitals:   04/09/20 1033  BP: (!) 161/107  Pulse: (!) 130  SpO2: 98%  Weight: (!) 383 lb 8 oz (174 kg)    Assessment & Plan:   See Encounters Tab for problem based charting.  Patient discussed with Dr. Dareen Piano

## 2020-04-10 ENCOUNTER — Other Ambulatory Visit: Payer: Self-pay

## 2020-04-10 ENCOUNTER — Encounter (HOSPITAL_COMMUNITY): Payer: Self-pay | Admitting: Emergency Medicine

## 2020-04-10 ENCOUNTER — Emergency Department (HOSPITAL_COMMUNITY)
Admission: EM | Admit: 2020-04-10 | Discharge: 2020-04-11 | Disposition: A | Discharge status: Home / Self Care | Payer: Medicare Other | Attending: Emergency Medicine | Admitting: Emergency Medicine

## 2020-04-10 ENCOUNTER — Encounter: Payer: Self-pay | Admitting: Internal Medicine

## 2020-04-10 DIAGNOSIS — R000 Tachycardia, unspecified: Secondary | ICD-10-CM | POA: Insufficient documentation

## 2020-04-10 DIAGNOSIS — E119 Type 2 diabetes mellitus without complications: Secondary | ICD-10-CM

## 2020-04-10 DIAGNOSIS — E1165 Type 2 diabetes mellitus with hyperglycemia: Secondary | ICD-10-CM | POA: Diagnosis not present

## 2020-04-10 DIAGNOSIS — J45909 Unspecified asthma, uncomplicated: Secondary | ICD-10-CM | POA: Insufficient documentation

## 2020-04-10 DIAGNOSIS — Z7951 Long term (current) use of inhaled steroids: Secondary | ICD-10-CM | POA: Diagnosis not present

## 2020-04-10 DIAGNOSIS — Z79899 Other long term (current) drug therapy: Secondary | ICD-10-CM | POA: Insufficient documentation

## 2020-04-10 DIAGNOSIS — E1129 Type 2 diabetes mellitus with other diabetic kidney complication: Secondary | ICD-10-CM | POA: Insufficient documentation

## 2020-04-10 DIAGNOSIS — I10 Essential (primary) hypertension: Secondary | ICD-10-CM | POA: Insufficient documentation

## 2020-04-10 HISTORY — DX: Type 2 diabetes mellitus without complications: E11.9

## 2020-04-10 LAB — BMP8+ANION GAP
Anion Gap: 18 mmol/L (ref 10.0–18.0)
BUN/Creatinine Ratio: 8 — ABNORMAL LOW (ref 9–23)
BUN: 5 mg/dL — ABNORMAL LOW (ref 6–20)
CO2: 19 mmol/L — ABNORMAL LOW (ref 20–29)
Calcium: 10 mg/dL (ref 8.7–10.2)
Chloride: 95 mmol/L — ABNORMAL LOW (ref 96–106)
Creatinine, Ser: 0.64 mg/dL (ref 0.57–1.00)
GFR calc Af Amer: 130 mL/min/{1.73_m2} (ref >59)
GFR calc non Af Amer: 113 mL/min/{1.73_m2} (ref >59)
Glucose: 369 mg/dL — ABNORMAL HIGH (ref 65–99)
Potassium: 4.2 mmol/L (ref 3.5–5.2)
Sodium: 132 mmol/L — ABNORMAL LOW (ref 134–144)

## 2020-04-10 LAB — CBC
HCT: 44.4 % (ref 36.0–46.0)
Hemoglobin: 14.2 g/dL (ref 12.0–15.0)
MCH: 29 pg (ref 26.0–34.0)
MCHC: 32 g/dL (ref 30.0–36.0)
MCV: 90.6 fL (ref 80.0–100.0)
Platelets: 394 10*3/uL (ref 150–400)
RBC: 4.9 MIL/uL (ref 3.87–5.11)
RDW: 13.4 % (ref 11.5–15.5)
WBC: 10.8 10*3/uL — ABNORMAL HIGH (ref 4.0–10.5)
nRBC: 0 % (ref 0.0–0.2)

## 2020-04-10 LAB — LIPID PANEL
Chol/HDL Ratio: 3.9 ratio (ref 0.0–4.4)
Cholesterol, Total: 229 mg/dL — ABNORMAL HIGH (ref 100–199)
HDL: 58 mg/dL
LDL Chol Calc (NIH): 148 mg/dL — ABNORMAL HIGH (ref 0–99)
Triglycerides: 130 mg/dL (ref 0–149)
VLDL Cholesterol Cal: 23 mg/dL (ref 5–40)

## 2020-04-10 LAB — BASIC METABOLIC PANEL
Anion gap: 11 (ref 5–15)
BUN: 7 mg/dL (ref 6–20)
CO2: 24 mmol/L (ref 22–32)
Calcium: 9.5 mg/dL (ref 8.9–10.3)
Chloride: 98 mmol/L (ref 98–111)
Creatinine, Ser: 0.68 mg/dL (ref 0.44–1.00)
GFR calc Af Amer: >60 mL/min (ref >60)
GFR calc non Af Amer: >60 mL/min (ref >60)
Glucose, Bld: 349 mg/dL — ABNORMAL HIGH (ref 70–99)
Potassium: 3.7 mmol/L (ref 3.5–5.1)
Sodium: 133 mmol/L — ABNORMAL LOW (ref 135–145)

## 2020-04-10 LAB — HEMOGLOBIN A1C
Est. average glucose Bld gHb Est-mCnc: 341 mg/dL
Hgb A1c MFr Bld: 13.5 % — ABNORMAL HIGH (ref 4.8–5.6)

## 2020-04-10 LAB — CBG MONITORING, ED: Glucose-Capillary: 326 mg/dL — ABNORMAL HIGH (ref 70–99)

## 2020-04-10 LAB — TSH: TSH: 2.06 u[IU]/mL (ref 0.450–4.500)

## 2020-04-10 LAB — I-STAT BETA HCG BLOOD, ED (MC, WL, AP ONLY): I-stat hCG, quantitative: <5 m[IU]/mL

## 2020-04-10 NOTE — Assessment & Plan Note (Signed)
She has not been using CPAP machine since her tonsillectomy. No symptoms of OSA anymore but stop-bang is still three, and she is high risk for OSA with her obesity and hypertension.   - referral for repeat sleep study

## 2020-04-10 NOTE — Assessment & Plan Note (Signed)
BP Readings from Last 3 Encounters:  04/09/20 (!) 161/107  09/18/18 122/65  06/19/18 (!) 111/59   Previously on lisinopril-HCTZ 20-25 mg qd. She has been on this without issue but has not taken it today. Repeat blood pressure 135/100  - BMP today  - continue lisinopril-HCTZ 20-25 mg qd

## 2020-04-10 NOTE — Assessment & Plan Note (Signed)
She previously followed up with a nutritionist but has not in some time. She felt this helped a little. She has been trying to eat healthy off and on, has not really been exercising. She is interested in information to help get back into healthy eating and exercising slowly. Discussed and gave information on starting slow with reintroducing exercise and healthier diet. She is not interested in bariatric surgery   - provided information on exercise and dieting - a1c  - lipid panel

## 2020-04-10 NOTE — Assessment & Plan Note (Signed)
-   last PAP HPV test 2018 - had covid vaccination - pfizer 7/24 and 8/14 - flu vaccine

## 2020-04-10 NOTE — Assessment & Plan Note (Signed)
Heart rate 130s even after resting in the room for 20 minutes. She does endorse some shortness of breath walking longer distances, like through the hospital to get to clinic, but not around her house. She denies chest pain, sensation of palpitations, nausea, arm tingling, jaw pain. No fever or chills or pain.  ECG done and shows sinus tachycardia.   - ECG showed sinus tachycardia - will obtain labs including BMP, A1c, TSH - f/u in one week, if continuing to have tachycardia will likely need echo and referral to cardiology as she could be at risk for tachy induced cardiomyopathy.   ADDENDUM: labs show new onset diabetes with likely mild DKA. She is asymptomatic except for noticing recently increased thirst. Discussed that she needs to go to the ER for fluids and insulin. She states that she will head there now.

## 2020-04-10 NOTE — ED Triage Notes (Signed)
Pt states she saw PCP yesterday and received a call today that glucose was high and to come to ED.  Pt denies complaints other than increased thirst.

## 2020-04-11 LAB — CBG MONITORING, ED: Glucose-Capillary: 293 mg/dL — ABNORMAL HIGH (ref 70–99)

## 2020-04-11 MED ORDER — METFORMIN HCL 500 MG PO TABS: 500.0000 mg | ORAL_TABLET | Freq: Two times a day (BID) | ORAL | 0 refills | Status: DC

## 2020-04-11 MED ORDER — SODIUM CHLORIDE 0.9 % IV BOLUS
1500.0000 mL | Freq: Once | INTRAVENOUS | Status: AC
  Administered 2020-04-11: 1500 mL via INTRAVENOUS

## 2020-04-11 MED ORDER — INSULIN ASPART 100 UNIT/ML ~~LOC~~ SOLN
5.0000 [IU] | Freq: Once | SUBCUTANEOUS | Status: AC
  Administered 2020-04-11: 5 [IU] via SUBCUTANEOUS

## 2020-04-11 MED ORDER — METFORMIN HCL 500 MG PO TABS: 500.0000 mg | ORAL_TABLET | Freq: Two times a day (BID) | ORAL | 1 refills | Status: DC

## 2020-04-11 MED ORDER — BLOOD GLUCOSE MONITOR KIT: PACK | 0 refills | Status: DC

## 2020-04-11 NOTE — ED Provider Notes (Signed)
[facility] EMERGENCY DEPARTMENT Provider Note   CSN: [id number] Arrival date & time: 04/10/20  1612     History Chief Complaint  Patient presents with  . Hyperglycemia    Suzanne Lewis is a 39 y.o. female.  39 year old female with a history of dyslipidemia, hypertension, morbid obesity presents to the emergency department for evaluation of hyperglycemia in the setting of new onset diabetes.  Was advised to seek evaluation by internal medicine teaching service after outpatient labs notable for hyperglycemia and mild acidosis.  Patient reports some progressive polyuria and polydipsia, but otherwise has no complaints.  Denies recent illness, fever, chest pain or shortness of breath, nausea, vomiting, dysuria, diarrhea, syncope or near syncope.  The history is provided by the patient. No language interpreter was used.  Hyperglycemia      Past Medical History:  Diagnosis Date  . Allergic rhinitis   . Arthritis    "RLE; broke my leg as a small child; no OR" (03/29/2018)  . Diabetes mellitus without complication ([place])   . Hyperlipidemia   . Hypertension   . Iron deficiency anemia   . Mild intermittent asthma   . Morbid obesity ([place])   . OSA on CPAP   . Vitamin D deficiency     Patient Active Problem List   Diagnosis Date Noted  . Tachycardia 04/10/2020  . Type II diabetes mellitus ([place]) 04/10/2020  . Right shoulder strain 06/19/2018  . S/P T&A (status post tonsillectomy and adenoidectomy) 03/29/2018  . Obstructive sleep apnea 09/11/2016  . Tinnitus 06/14/2016  . Lichen planus 37/11/8887  . Essential hypertension 11/24/2015  . Hyperlipidemia 02/09/2015  . Morbid obesity ([place]) 02/07/2015  . Mild intermittent asthma 02/07/2015  . Iron deficiency anemia 02/07/2015  . Healthcare maintenance 02/07/2015  . Allergic rhinitis 02/07/2015    Past Surgical History:  Procedure Laterality Date  . CESAREAN SECTION  2004  . INTRAUTERINE DEVICE INSERTION    .  TONSILLECTOMY AND ADENOIDECTOMY  03/29/2018  . TONSILLECTOMY AND ADENOIDECTOMY N/A 03/29/2018   Procedure: TONSILLECTOMY AND ADENOIDECTOMY;  Surgeon: Leta Baptist, MD;  Location: [facility] OR;  Service: ENT;  Laterality: N/A;     OB History   No obstetric history on file.     Family History  Problem Relation Age of Onset  . Diabetes Mother        died at age 67  . Heart failure Mother   . Hypertension Mother   . Hypertension Brother     Social History   Tobacco Use  . Smoking status: Never Smoker  . Smokeless tobacco: Never Used  Vaping Use  . Vaping Use: Never used  Substance Use Topics  . Alcohol use: Never    Alcohol/week: 0.0 standard drinks  . Drug use: Never    Home Medications Prior to Admission medications   Medication Sig Start Date End Date Taking? Authorizing Provider  acetaminophen (TYLENOL) 500 MG tablet Take 1,000 mg by mouth 2 (two) times daily as needed for moderate pain or headache.    [provider]  blood glucose meter kit and supplies KIT Dispense based on patient and insurance preference. Use up to four times daily as directed. (FOR ICD-9 250.00, 250.01). 04/11/20   Antonietta Breach, PA-C  Cetirizine HCl 10 MG CAPS Take 1 capsule (10 mg total) by mouth daily. Patient taking differently: Take 10 mg by mouth daily as needed (allergies).  11/10/16   Velna Ochs, MD  cycloSPORINE (RESTASIS) 0.05 % ophthalmic emulsion Place 1 drop  into both eyes 2 (two) times daily.    [provider]  diclofenac sodium (VOLTAREN) 1 % GEL Apply 4 g topically 4 (four) times daily as needed. 06/19/18   Velna Ochs, MD  lisinopril-hydrochlorothiazide (ZESTORETIC) 20-25 MG tablet Take 1 tablet by mouth daily. 04/09/20   Seawell, Jaimie A, DO  metFORMIN (GLUCOPHAGE) 500 MG tablet Take 1 tablet (500 mg total) by mouth 2 (two) times daily with a meal. 04/11/20   Antonietta Breach, PA-C  metFORMIN (GLUCOPHAGE) 500 MG tablet Take 1 tablet (500 mg total) by mouth 2 (two) times  daily with a meal. 04/11/20   Antonietta Breach, PA-C  Olopatadine HCl (PAZEO) 0.7 % SOLN Place 1 drop into both eyes daily.     [provider]  PROAIR HFA 108 [phone] Base) MCG/ACT inhaler Inhale 1-2 puffs into the lungs every 6 (six) hours as needed for wheezing or shortness of breath. 04/09/20   Seawell, Jaimie A, DO    Allergies    Citric acid and Codeine  Review of Systems   Review of Systems  Ten systems reviewed and are negative for acute change, except as noted in the HPI.    Physical Exam Updated Vital Signs BP 130/86 (BP Location: Left Wrist)   Pulse 100   Temp (!) 97.3 F (36.3 C) (Oral)   Resp 16   Ht 4' 10"  (1.473 m)   Wt (!) 173.7 kg   SpO2 100%   BMI 80.05 kg/m   Physical Exam Vitals and nursing note reviewed.  Constitutional:      General: She is not in acute distress.    Appearance: She is well-developed. She is not diaphoretic.     Comments: Obese AA female. Pleasant.  HENT:     Head: Normocephalic and atraumatic.  Eyes:     General: No scleral icterus.    Conjunctiva/sclera: Conjunctivae normal.  Cardiovascular:     Rate and Rhythm: Regular rhythm. Tachycardia present.     Pulses: Normal pulses.  Pulmonary:     Effort: Pulmonary effort is normal. No respiratory distress.     Comments: Respirations even and unlabored Musculoskeletal:        General: Normal range of motion.     Cervical back: Normal range of motion.  Skin:    General: Skin is warm and dry.     Coloration: Skin is not pale.     Findings: No erythema or rash.  Neurological:     General: No focal deficit present.     Mental Status: She is alert and oriented to person, place, and time.     Coordination: Coordination normal.  Psychiatric:        Behavior: Behavior normal.     ED Results / Procedures / Treatments   Labs (all labs ordered are listed, but only abnormal results are displayed) Labs Reviewed  BASIC METABOLIC PANEL - Abnormal; Notable for the following components:       Result Value   Sodium 133 (*)    Glucose, Bld 349 (*)    All other components within normal limits  CBC - Abnormal; Notable for the following components:   WBC 10.8 (*)    All other components within normal limits  CBG MONITORING, ED - Abnormal; Notable for the following components:   Glucose-Capillary 326 (*)    All other components within normal limits  CBG MONITORING, ED - Abnormal; Notable for the following components:   Glucose-Capillary 293 (*)    All other components within  normal limits  I-STAT BETA HCG BLOOD, ED ([facility], [facility], [facility] ONLY)  CBG MONITORING, ED    EKG None  Radiology No results found.  Procedures Procedures (including critical care time)  Medications Ordered in ED Medications  sodium chloride 0.9 % bolus 1,500 mL (0 mLs Intravenous Stopped 04/11/20 0227)  insulin aspart (novoLOG) injection 5 Units (5 Units Subcutaneous Given 04/11/20 0201)    ED Course  I have reviewed the triage vital signs and the nursing notes.  Pertinent labs & imaging results that were available during my care of the patient were reviewed by me and considered in my medical decision making (see chart for details).  Clinical Course as of Apr 11 233  Fri Apr 11, 2020  0034 Patient presenting to the emergency department for evaluation of hyperglycemia.  In clinic, patient also had a mild anion gap acidosis.  It appears this has resolved without intervention.  Anion gap at this time is normal.  She does have persistent hyperglycemia with glucose of 349.  Has been tachycardic while in the waiting room.  It appears that this was also the case when evaluated in clinic yesterday.  Will give IV fluids and repeat CBG prior to administration of any insulin.  Anticipate discharge on Metformin with outpatient primary care follow-up.   [OZ]  3086 Patient is feeling better. Expresses comfort with plan for discharge.   [KH]    Clinical Course User Index [KH] Beverely Pace   MDM  Rules/Calculators/A&P                          39 year old female presents to the emergency department at the advice of her primary care doctor for evaluation of new onset type-2 diabetes.  Outpatient labs suggestive of mild DKA.  Repeat labs obtained in triage and acidosis appears to have resolved spontaneously.  Patient is persistently hyperglycemic.  Treated in the ED with IV fluids as well as subcutaneous insulin.  Will start on daily Metformin and have the patient follow-up with her primary care doctor for blood sugar recheck no indication for further emergent work-up or admission at this time.  Patient discharged in stable condition with no unaddressed concerns.   Final Clinical Impression(s) / ED Diagnoses Final diagnoses:  New onset type 2 diabetes mellitus ([place])    Rx / DC Orders ED Discharge Orders         Ordered    metFORMIN (GLUCOPHAGE) 500 MG tablet  2 times daily with meals        04/11/20 0223    metFORMIN (GLUCOPHAGE) 500 MG tablet  2 times daily with meals        04/11/20 0224    blood glucose meter kit and supplies KIT        04/11/20 0225           Antonietta Breach, PA-C 57/84/69 [id number]    Delora Fuel, MD 28/41/32 [phone]

## 2020-04-11 NOTE — Discharge Instructions (Signed)
Take Metformin as prescribed and follow-up with your primary care doctor within the week for recheck of your sugar levels. Try to limit your carbohydrates and processed foods. Drink plenty of water. Return for new or concerning symptoms.

## 2020-04-14 NOTE — Progress Notes (Signed)
Internal Medicine Clinic Attending  Case discussed with Dr. Seawell  At the time of the visit.  We reviewed the resident's history and exam and pertinent patient test results.  I agree with the assessment, diagnosis, and plan of care documented in the resident's note.  

## 2020-04-16 ENCOUNTER — Other Ambulatory Visit: Payer: Self-pay

## 2020-04-16 ENCOUNTER — Ambulatory Visit (INDEPENDENT_AMBULATORY_CARE_PROVIDER_SITE_OTHER): Payer: Medicare Other | Admitting: Internal Medicine

## 2020-04-16 ENCOUNTER — Encounter: Payer: Self-pay | Admitting: Internal Medicine

## 2020-04-16 VITALS — Temp 98.0°F | Ht 59.0 in | Wt 380.0 lb

## 2020-04-16 DIAGNOSIS — I10 Essential (primary) hypertension: Secondary | ICD-10-CM | POA: Diagnosis not present

## 2020-04-16 DIAGNOSIS — E1165 Type 2 diabetes mellitus with hyperglycemia: Secondary | ICD-10-CM

## 2020-04-16 LAB — GLUCOSE, CAPILLARY: Glucose-Capillary: 232 mg/dL — ABNORMAL HIGH (ref 70–99)

## 2020-04-16 MED ORDER — SEMAGLUTIDE 3 MG PO TABS: 3.0000 mg | ORAL_TABLET | Freq: Every day | ORAL | 0 refills | Status: DC

## 2020-04-16 NOTE — Patient Instructions (Addendum)
Thank you for allowing [place] to provide your care today. Today we discussed your new diabetes diagnosis  I have ordered the following labs for you:  Urinalysis and urine microalbumin   I will call if any are abnormal.    Today we made the following changes to your medications:   Please START taking  Semaglutide 3 mg tablet once daily - in one month we will increase your dosage to 7mg   Continue metformin 500 mg two times per day. If you are having diarrhea you can decrease to 500 mg once per day, then increase back to two times per day in one week.   Please follow-up in two weeks for telehealth visit.    I have placed a referral to [facility] to discuss diabetes management.   I have also placed a referral to ophthalmology as they will need to screen you for diabetic retinopathy  Please call the internal medicine center clinic if you have any questions or concerns, we may be able to help and keep you from a long and expensive emergency room wait. Our clinic and after hours phone number is [phone], the best time to call is Monday through Friday 9 am to 4 pm but there is always someone available 24/7 if you have an emergency. If you need medication refills please notify your pharmacy one week in advance and they will send [place] a request.

## 2020-04-16 NOTE — Progress Notes (Signed)
   CC: Type II diabetes  HPI:  [name] is a 39 y.o. with PMH as below.   Please see A&P for assessment of the patient's acute and chronic medical conditions.   Diagnosed 9/8 with type II diabetes with A1C of 13.5. She was sent to the ER due to tachycardia, hyperglycemia and labs significant for possible mild DKA although she was asymptomatic. Repeat labs showed resolution of acidosis in ER. She was started on metformin 500 mg bid and given a glucometer. She has been taking metformin and having diarrhea and is checking sugars before and after breakfast and before bed. Glucose has ranged around 200-300.  She denies increased urination, dizziness, sweating, nausea, vomiting, chest pain. She has had increased thirst the past month. She has noticed decreased vision over the last month.    Past Medical History:  Diagnosis Date  . Allergic rhinitis   . Arthritis    "RLE; broke my leg as a small child; no OR" (03/29/2018)  . Diabetes mellitus without complication ([place])   . Hyperlipidemia   . Hypertension   . Iron deficiency anemia   . Mild intermittent asthma   . Morbid obesity ([place])   . OSA on CPAP   . Vitamin D deficiency    Review of Systems:   10 point ROS negative except as noted in HPI  Physical Exam:   Constitution: NAD, appears stated age, morbidly obese HENT: [place]/AT Cardio: tachycardic, regular rhythm, no m/r/g, no LE edema  Respiratory: CTA, no w/r/r Abdominal: NTTP, soft, non-distended Neuro: normal affect, a&ox3 Skin: c/d/i    Vitals:   04/16/20 1036  Temp: 98 F (36.7 C)  TempSrc: Oral  SpO2: 98%  Weight: (!) 380 lb (172.4 kg)  Height: 4\' 11"  (1.499 m)     Assessment & Plan:   See Encounters Tab for problem based charting.  Patient discussed with Dr. Rebeca Alert

## 2020-04-17 LAB — MICROSCOPIC EXAMINATION
Bacteria, UA: NONE SEEN
Casts: NONE SEEN /lpf

## 2020-04-17 LAB — MICROALBUMIN / CREATININE URINE RATIO
Creatinine, Urine: 80.7 mg/dL
Microalb/Creat Ratio: 658 mg/g creat — ABNORMAL HIGH (ref 0–29)
Microalbumin, Urine: 531.3 ug/mL

## 2020-04-17 LAB — URINALYSIS, ROUTINE W REFLEX MICROSCOPIC
Bilirubin, UA: NEGATIVE
Glucose, UA: NEGATIVE
Ketones, UA: NEGATIVE
Leukocytes,UA: NEGATIVE
Nitrite, UA: NEGATIVE
RBC, UA: NEGATIVE
Specific Gravity, UA: 1.009 (ref 1.005–1.030)
Urobilinogen, Ur: 0.2 mg/dL (ref 0.2–1.0)
pH, UA: 5 (ref 5.0–7.5)

## 2020-04-18 NOTE — Assessment & Plan Note (Addendum)
Diagnosed 9/8 with type II diabetes with A1C of 13.5. She was sent to the ER due to tachycardia, hyperglycemia and labs significant for possible mild DKA although she was asymptomatic. Repeat labs showed resolution of acidosis in ER. She was started on metformin 500 mg bid and given a glucometer. She has been taking metformin and having diarrhea and is checking sugars before and after breakfast and before bed. Glucose has ranged around 200-300.  She denies increased urination, dizziness, sweating, nausea, vomiting, chest pain. She has had increased thirst the past month. She has noticed decreased vision over the last month.   Her hyperglycemia is improved today, still elevated at 232. She has no symptoms of DKA although she continues to have tachycardia. She will require higher initial management to improve her A1C.   - discussed symptoms of DKA and hypoglycemia to monitor for - continue recording glucose levels - she is also trying to lose weight - start semaglutide 3mg  qd for one month, then increase to 7mg . Rx is mail out so need to order 7mg  tablet at 2 wk follow-up - if needed, can decrease metformin to 500 mg qd for one week, then increase back to 500 bid. Can switch to xr if continuing to have diarrhea  - referral to [facility] for diabetes - opthalmology referral placed - f/u 2 weeks  - will need to start statin at follow-up - urine microalbumin  ADDENDUM: microalbumin ratio elevated. She is already on lisinopril-HCTZ 20-12.5. BP was elevated at appointment, normally well controlled with this regimen. Recheck bp at follow-up and can consider switching to lisinopril only or increasing lisinopril-HCTZ pending BP results.

## 2020-04-21 ENCOUNTER — Other Ambulatory Visit: Payer: Self-pay | Admitting: Internal Medicine

## 2020-04-21 DIAGNOSIS — E1165 Type 2 diabetes mellitus with hyperglycemia: Secondary | ICD-10-CM

## 2020-04-21 NOTE — Telephone Encounter (Signed)
3mg  is just 1 month starting dose, as noted by Dr Sharon Seller we will increase to the 7mg  dose.

## 2020-04-23 NOTE — Progress Notes (Signed)
Internal Medicine Clinic Attending  Case discussed with Dr. Seawell at the time of the visit.  We reviewed the resident's history and exam and pertinent patient test results.  I agree with the assessment, diagnosis, and plan of care documented in the resident's note.  Yolande Skoda, M.D., Ph.D.  

## 2020-05-07 ENCOUNTER — Encounter: Payer: Medicare Other | Admitting: Internal Medicine

## 2020-05-07 ENCOUNTER — Other Ambulatory Visit: Payer: Self-pay

## 2020-05-13 ENCOUNTER — Encounter: Payer: Medicare Other | Admitting: Dietician

## 2020-05-19 NOTE — Addendum Note (Signed)
Addended by: Hulan Fray on: 05/19/2020 04:29 PM   Modules accepted: Orders

## 2020-05-22 LAB — HM DIABETES EYE EXAM

## 2020-06-01 DIAGNOSIS — H5213 Myopia, bilateral: Secondary | ICD-10-CM | POA: Diagnosis not present

## 2020-07-15 DIAGNOSIS — G4733 Obstructive sleep apnea (adult) (pediatric): Secondary | ICD-10-CM | POA: Diagnosis not present

## 2020-08-27 ENCOUNTER — Encounter: Payer: Self-pay | Admitting: Dietician

## 2020-09-18 ENCOUNTER — Telehealth: Payer: Self-pay

## 2020-09-18 NOTE — Telephone Encounter (Signed)
Pt is requesting her metFORMIN (GLUCOPHAGE) 500 MG tablet  Sent to  [facility], [address], [address] Phone:  [phone]  Fax:  [phone]

## 2020-09-19 MED ORDER — METFORMIN HCL 500 MG PO TABS: 500.0000 mg | ORAL_TABLET | Freq: Two times a day (BID) | ORAL | 1 refills | Status: DC

## 2020-09-19 NOTE — Telephone Encounter (Signed)
Approved short term refill of metformin 500 mg BID. Please have her schedule appointment for in person follow up.

## 2020-09-21 ENCOUNTER — Other Ambulatory Visit: Payer: Self-pay | Admitting: Internal Medicine

## 2020-09-22 ENCOUNTER — Other Ambulatory Visit: Payer: Self-pay | Admitting: Internal Medicine

## 2020-09-22 NOTE — Telephone Encounter (Signed)
Appt sch for 10/22/2020 @ 10:45 am with Dr. Philipp Ovens. Pt has been notified.

## 2020-09-23 ENCOUNTER — Other Ambulatory Visit: Payer: Self-pay | Admitting: Internal Medicine

## 2020-09-25 ENCOUNTER — Other Ambulatory Visit: Payer: Self-pay | Admitting: Internal Medicine

## 2020-10-15 DIAGNOSIS — G4733 Obstructive sleep apnea (adult) (pediatric): Secondary | ICD-10-CM | POA: Diagnosis not present

## 2020-10-22 ENCOUNTER — Encounter: Payer: Medicare Other | Admitting: Internal Medicine

## 2020-12-10 ENCOUNTER — Encounter: Payer: Medicare Other | Admitting: Internal Medicine

## 2020-12-10 ENCOUNTER — Ambulatory Visit: Payer: Medicare Other | Admitting: Dietician

## 2020-12-17 ENCOUNTER — Other Ambulatory Visit: Payer: Self-pay | Admitting: *Deleted

## 2020-12-17 DIAGNOSIS — E1165 Type 2 diabetes mellitus with hyperglycemia: Secondary | ICD-10-CM

## 2020-12-17 MED ORDER — RYBELSUS 7 MG PO TABS: 1.0000 | ORAL_TABLET | Freq: Every day | ORAL | 0 refills | Status: DC

## 2020-12-17 NOTE — Telephone Encounter (Signed)
Per [facility] Pt used last refill on 12/11/20 System automatically  sends refill request to MD once last refill has been used.

## 2020-12-17 NOTE — Telephone Encounter (Signed)
Approved 1 month refill of Rybelsus to last her until her appointment with me next month.

## 2021-01-01 ENCOUNTER — Other Ambulatory Visit: Payer: Self-pay | Admitting: Internal Medicine

## 2021-01-01 DIAGNOSIS — J4520 Mild intermittent asthma, uncomplicated: Secondary | ICD-10-CM

## 2021-01-01 NOTE — Telephone Encounter (Signed)
Next appt scheduled 01/07/21 with PCP.

## 2021-01-03 ENCOUNTER — Other Ambulatory Visit: Payer: Self-pay | Admitting: Internal Medicine

## 2021-01-03 DIAGNOSIS — E1165 Type 2 diabetes mellitus with hyperglycemia: Secondary | ICD-10-CM

## 2021-01-07 ENCOUNTER — Encounter: Payer: Medicare Other | Admitting: Internal Medicine

## 2021-01-07 ENCOUNTER — Encounter: Payer: Self-pay | Admitting: Internal Medicine

## 2021-01-07 ENCOUNTER — Telehealth: Payer: Self-pay | Admitting: *Deleted

## 2021-01-07 NOTE — Telephone Encounter (Signed)
Refill request denied. She has canceled or no showed her last three appointments with me including today.

## 2021-01-07 NOTE — Telephone Encounter (Signed)
Pt did not come to her appt this am w/Dr Guilloud @ 0915 AM. Called pt - no answer; left message on self identified vm to call the office to re-schedule the appt.

## 2021-01-14 DIAGNOSIS — G4733 Obstructive sleep apnea (adult) (pediatric): Secondary | ICD-10-CM | POA: Diagnosis not present

## 2021-02-03 ENCOUNTER — Encounter: Payer: Self-pay | Admitting: *Deleted

## 2021-02-12 ENCOUNTER — Other Ambulatory Visit: Payer: Self-pay | Admitting: Internal Medicine

## 2021-02-12 DIAGNOSIS — E1165 Type 2 diabetes mellitus with hyperglycemia: Secondary | ICD-10-CM

## 2021-02-13 NOTE — Telephone Encounter (Signed)
Refill request denied. She has canceled or no showed her last three appointments with me. See prior refill request note. Thanks

## 2021-02-15 ENCOUNTER — Other Ambulatory Visit: Payer: Self-pay | Admitting: Internal Medicine

## 2021-02-15 DIAGNOSIS — E1165 Type 2 diabetes mellitus with hyperglycemia: Secondary | ICD-10-CM

## 2021-02-25 ENCOUNTER — Other Ambulatory Visit: Payer: Self-pay | Admitting: Internal Medicine

## 2021-02-25 DIAGNOSIS — E1165 Type 2 diabetes mellitus with hyperglycemia: Secondary | ICD-10-CM

## 2021-02-27 ENCOUNTER — Other Ambulatory Visit: Payer: Self-pay | Admitting: Internal Medicine

## 2021-02-27 DIAGNOSIS — E1165 Type 2 diabetes mellitus with hyperglycemia: Secondary | ICD-10-CM

## 2021-03-03 ENCOUNTER — Other Ambulatory Visit: Payer: Self-pay | Admitting: Internal Medicine

## 2021-03-03 DIAGNOSIS — E1165 Type 2 diabetes mellitus with hyperglycemia: Secondary | ICD-10-CM

## 2021-03-18 ENCOUNTER — Other Ambulatory Visit: Payer: Self-pay | Admitting: Internal Medicine

## 2021-03-18 DIAGNOSIS — E1165 Type 2 diabetes mellitus with hyperglycemia: Secondary | ICD-10-CM

## 2021-03-23 ENCOUNTER — Other Ambulatory Visit: Payer: Self-pay | Admitting: Internal Medicine

## 2021-03-23 DIAGNOSIS — E1165 Type 2 diabetes mellitus with hyperglycemia: Secondary | ICD-10-CM

## 2021-03-25 ENCOUNTER — Other Ambulatory Visit: Payer: Self-pay | Admitting: Internal Medicine

## 2021-03-25 DIAGNOSIS — E1165 Type 2 diabetes mellitus with hyperglycemia: Secondary | ICD-10-CM

## 2021-03-30 ENCOUNTER — Other Ambulatory Visit: Payer: Self-pay | Admitting: Internal Medicine

## 2021-03-30 DIAGNOSIS — E1165 Type 2 diabetes mellitus with hyperglycemia: Secondary | ICD-10-CM

## 2021-04-08 ENCOUNTER — Encounter: Payer: Self-pay | Admitting: Internal Medicine

## 2021-04-09 ENCOUNTER — Other Ambulatory Visit: Payer: Self-pay | Admitting: Internal Medicine

## 2021-04-09 DIAGNOSIS — E1165 Type 2 diabetes mellitus with hyperglycemia: Secondary | ICD-10-CM

## 2021-04-15 ENCOUNTER — Other Ambulatory Visit: Payer: Self-pay | Admitting: Internal Medicine

## 2021-04-15 DIAGNOSIS — E1165 Type 2 diabetes mellitus with hyperglycemia: Secondary | ICD-10-CM

## 2021-04-17 DIAGNOSIS — G4733 Obstructive sleep apnea (adult) (pediatric): Secondary | ICD-10-CM | POA: Diagnosis not present

## 2021-04-18 ENCOUNTER — Ambulatory Visit: Payer: Medicare Other

## 2021-04-21 ENCOUNTER — Other Ambulatory Visit: Payer: Self-pay | Admitting: Internal Medicine

## 2021-04-21 ENCOUNTER — Other Ambulatory Visit: Payer: Self-pay

## 2021-04-21 DIAGNOSIS — I10 Essential (primary) hypertension: Secondary | ICD-10-CM

## 2021-04-21 DIAGNOSIS — E1165 Type 2 diabetes mellitus with hyperglycemia: Secondary | ICD-10-CM

## 2021-04-21 NOTE — Telephone Encounter (Signed)
Refill request for Rybelsus Multiple requests denied as pt needs an appt Contacted pt to make her aware Requests an appt for "next month" Advised pt no refills without a visit States she still has Rybelsus on hand.  Appt given with pcp for 05/20/21 @0845 

## 2021-04-22 MED ORDER — LISINOPRIL-HYDROCHLOROTHIAZIDE 20-25 MG PO TABS: 1.0000 | ORAL_TABLET | Freq: Every day | ORAL | 0 refills | Status: DC

## 2021-04-22 NOTE — Telephone Encounter (Signed)
Approved 30 day refill of her lisinopril-HCTZ. She is overdue for an appointment but has one scheduled with me on 05/20/21. If she no shows this next appointment, I will not continue to refill her medications.

## 2021-05-12 ENCOUNTER — Other Ambulatory Visit: Payer: Self-pay | Admitting: Internal Medicine

## 2021-05-12 DIAGNOSIS — I10 Essential (primary) hypertension: Secondary | ICD-10-CM

## 2021-05-19 ENCOUNTER — Other Ambulatory Visit: Payer: Self-pay | Admitting: Internal Medicine

## 2021-05-19 DIAGNOSIS — I10 Essential (primary) hypertension: Secondary | ICD-10-CM

## 2021-05-20 ENCOUNTER — Encounter: Payer: Medicare Other | Admitting: Internal Medicine

## 2021-05-21 ENCOUNTER — Telehealth: Payer: Self-pay

## 2021-05-21 DIAGNOSIS — E1165 Type 2 diabetes mellitus with hyperglycemia: Secondary | ICD-10-CM

## 2021-05-21 NOTE — Telephone Encounter (Signed)
She's canceled / no showed too many appointments. Refill request denied until she can be seen.

## 2021-05-21 NOTE — Telephone Encounter (Signed)
Yes she's canceled / no showed too many appointments. I've already declined refill requests in the recent past for the reason. She needs to be seen before I will refill meds. She can schedule with one of the residents if she wants to be seen sooner. Thanks!

## 2021-05-24 ENCOUNTER — Other Ambulatory Visit: Payer: Self-pay | Admitting: Internal Medicine

## 2021-05-24 DIAGNOSIS — I10 Essential (primary) hypertension: Secondary | ICD-10-CM

## 2021-05-26 ENCOUNTER — Other Ambulatory Visit: Payer: Self-pay

## 2021-05-26 DIAGNOSIS — E1165 Type 2 diabetes mellitus with hyperglycemia: Secondary | ICD-10-CM

## 2021-05-26 NOTE — Telephone Encounter (Signed)
Refill request denied. Please see prior refill request notes. She has missed too many appointments. Needs to be seen before I will authorize further refills. Thanks!

## 2021-05-26 NOTE — Telephone Encounter (Signed)
Just spoke with patient.  Made her aware that she needs to be seen in order to get medication refilled.  Agreed to an appointment for next Monday 06/01/2021 at 1:45 pm with Dr. Allyson Sabal.

## 2021-06-01 ENCOUNTER — Ambulatory Visit (INDEPENDENT_AMBULATORY_CARE_PROVIDER_SITE_OTHER): Payer: Medicare Other | Admitting: Student

## 2021-06-01 ENCOUNTER — Other Ambulatory Visit: Payer: Self-pay

## 2021-06-01 ENCOUNTER — Encounter: Payer: Self-pay | Admitting: Student

## 2021-06-01 ENCOUNTER — Other Ambulatory Visit: Payer: Self-pay | Admitting: Internal Medicine

## 2021-06-01 VITALS — BP 138/90 | HR 106 | Temp 98.2°F | Resp 24 | Ht 59.0 in | Wt 378.3 lb

## 2021-06-01 DIAGNOSIS — E785 Hyperlipidemia, unspecified: Secondary | ICD-10-CM

## 2021-06-01 DIAGNOSIS — S93409A Sprain of unspecified ligament of unspecified ankle, initial encounter: Secondary | ICD-10-CM | POA: Insufficient documentation

## 2021-06-01 DIAGNOSIS — I10 Essential (primary) hypertension: Secondary | ICD-10-CM

## 2021-06-01 DIAGNOSIS — E119 Type 2 diabetes mellitus without complications: Secondary | ICD-10-CM

## 2021-06-01 DIAGNOSIS — S93401A Sprain of unspecified ligament of right ankle, initial encounter: Secondary | ICD-10-CM

## 2021-06-01 DIAGNOSIS — D509 Iron deficiency anemia, unspecified: Secondary | ICD-10-CM | POA: Diagnosis not present

## 2021-06-01 DIAGNOSIS — Z794 Long term (current) use of insulin: Secondary | ICD-10-CM | POA: Diagnosis not present

## 2021-06-01 DIAGNOSIS — Z0000 Encounter for general adult medical examination without abnormal findings: Secondary | ICD-10-CM

## 2021-06-01 DIAGNOSIS — E1129 Type 2 diabetes mellitus with other diabetic kidney complication: Secondary | ICD-10-CM | POA: Diagnosis not present

## 2021-06-01 DIAGNOSIS — R809 Proteinuria, unspecified: Secondary | ICD-10-CM | POA: Diagnosis not present

## 2021-06-01 NOTE — Assessment & Plan Note (Signed)
Patient with morbid obesity (BMI 76.4). Discussed weight loss measures, including exercising >150 minutes/week and DASH diet. She is on rybelsus for glycemic control and weight loss benefit, which we may be able to increase should she tolerate a higher dose.   Plan: -exercise and DASH diet -increase rybelsus dose if elevated A1c

## 2021-06-01 NOTE — Progress Notes (Signed)
   CC: right ankle pain  HPI:  [name] is a 40 y.o. female with history listed below presenting to the [facility] for right ankle pain. Please see individualized problem based charting for full HPI.  Past Medical History:  Diagnosis Date   Allergic rhinitis    Arthritis    "RLE; broke my leg as a small child; no OR" (03/29/2018)   Diabetes mellitus without complication (HCC)    Hyperlipidemia    Hypertension    Iron deficiency anemia    Mild intermittent asthma    Morbid obesity (HCC)    OSA on CPAP    Vitamin D deficiency     Review of Systems:  Negative aside from that listed in individualized problem based charting.  Physical Exam:  Vitals:   06/01/21 1321  BP: 138/90  Pulse: (!) 106  Resp: (!) 24  Temp: 98.2 F (36.8 C)  TempSrc: Oral  SpO2: 99%  Weight: (!) 378 lb 4.8 oz (171.6 kg)  Height: 4\' 11"  (1.499 m)   Physical Exam Constitutional:      Appearance: She is obese. She is not ill-appearing.  HENT:     Mouth/Throat:     Mouth: Mucous membranes are moist.     Pharynx: Oropharynx is clear. No oropharyngeal exudate.  Eyes:     Extraocular Movements: Extraocular movements intact.     Conjunctiva/sclera: Conjunctivae normal.     Pupils: Pupils are equal, round, and reactive to light.  Cardiovascular:     Rate and Rhythm: Normal rate and regular rhythm.     Pulses: Normal pulses.     Heart sounds: Normal heart sounds. No murmur heard.   No gallop.  Pulmonary:     Effort: Pulmonary effort is normal.     Breath sounds: Normal breath sounds. No wheezing, rhonchi or rales.  Abdominal:     General: Bowel sounds are normal. There is no distension.     Palpations: Abdomen is soft.     Tenderness: There is no abdominal tenderness.  Musculoskeletal:        General: No swelling.     Comments: Right ankle tenderness with flexion, but normal ROM with extension, inversion, and eversion. No swelling noted in right ankle and no ecchymoses either. No tenderness at  bases of toes or at medial and lateral malleoli. Normal ROM in left ankle.  Skin:    General: Skin is warm and dry.  Neurological:     Mental Status: She is alert.     Assessment & Plan:   See Encounters Tab for problem based charting.  Patient discussed with Dr. Heber [place]

## 2021-06-01 NOTE — Assessment & Plan Note (Signed)
Patient with T2DM with microalbuminuria, on metformin 500mg  BID and rybelsus 7mg  daily. Her last A1c was 13.5% last year, so will recheck today. Should it remain elevated, will consider increasing rybelsus dose for added weight loss benefit.   Plan: -continue metformin and rybelsus -f/u A1c, if elevated > increase rybelsus dose

## 2021-06-01 NOTE — Assessment & Plan Note (Signed)
Lipid Panel     Component Value Date/Time   CHOL 229 (H) 04/09/2020 1120   TRIG 130 04/09/2020 1120   HDL 58 04/09/2020 1120   CHOLHDL 3.9 04/09/2020 1120   CHOLHDL 3.7 02/07/2015 1026   VLDL 24 02/07/2015 1026   LDLCALC 148 (H) 04/09/2020 1120   LABVLDL 23 04/09/2020 1120   Last lipid panel with LDL above goal. She is indicated for statin therapy given HTN and concomitant T2DM (ASCVD risk is intermediate at 5.3%), although she does not currently wish to start this. Agreed to repeat lipid panel today and consider initiating lipid therapy at f/u visit in 2 weeks should lipids remain elevated.   Plan: -f/u lipid panel -counsel on statin therapy at next visit

## 2021-06-01 NOTE — Assessment & Plan Note (Signed)
Patient with history of IDA, previously supplemented and at goal during last check. She is due for another iron panel to ensure she has not become deficient again so will obtain this today and provide supplementation should she need it.  Plan: -f/u iron panel

## 2021-06-01 NOTE — Assessment & Plan Note (Signed)
Patient due for PAP smear. Discussed importance of cervical cancer screening. She would like to schedule an appointment in 2 weeks for PAP smear.

## 2021-06-01 NOTE — Assessment & Plan Note (Signed)
Patient presenting with right ankle sprain that occurred yesterday when she tripped over her boyfriend's shoes. States that her right ankle inverted and she began experiencing mild-moderate pain on anterior aspect of leg above ankles. She has been able to bear weight on right foot, although movement has become somewhat diminished compared to before. She had partial relief with RICE and ibuprofen prn.   On exam, mild pain noted with active and passive flexion of ankle but otherwise negative ankle exam. No pain at bases of toes or at medial or lateral malleoli, which is reassuring. Per [place] Ankle Rule, no indication for x-ray imaging at this time. Discussed continued conservative therapy with RICE and tylenol prn. Provided pamphlet on ankle sprain for patient's knowledge.  Plan: -RICE, tylenol prn -f/u in 2 weeks for PAP smear and to ensure ankle pain is improving

## 2021-06-01 NOTE — Patient Instructions (Signed)
Suzanne Lewis,  It was a pleasure seeing you in the clinic today.   I have provided a pamphlet for your ankle sprain. Please make sure to read through it to better manage your ankle pain. We are getting blood work today. I will call you with the results. Please come back in 3 months for follow up.  Please call our clinic at [phone] if you have any questions or concerns. The best time to call is Monday-Friday from 9am-4pm, but there is someone available 24/7 at the same number. If you need medication refills, please notify your pharmacy one week in advance and they will send [place] a request.   Thank you for letting us take part in your care. We look forward to seeing you next time!

## 2021-06-01 NOTE — Assessment & Plan Note (Signed)
Vitals:   06/01/21 1321  BP: 138/90   Patient with history of HTN, on zestoretic 20-25mg  daily. Blood pressure is fairly well controlled today so will continue current medication.

## 2021-06-02 ENCOUNTER — Other Ambulatory Visit: Payer: Self-pay

## 2021-06-02 ENCOUNTER — Telehealth: Payer: Self-pay

## 2021-06-02 DIAGNOSIS — J4520 Mild intermittent asthma, uncomplicated: Secondary | ICD-10-CM

## 2021-06-02 LAB — LIPID PANEL
Chol/HDL Ratio: 3.8 ratio (ref 0.0–4.4)
Cholesterol, Total: 218 mg/dL — ABNORMAL HIGH (ref 100–199)
HDL: 58 mg/dL (ref >39)
LDL Chol Calc (NIH): 134 mg/dL — ABNORMAL HIGH (ref 0–99)
Triglycerides: 147 mg/dL (ref 0–149)
VLDL Cholesterol Cal: 26 mg/dL (ref 5–40)

## 2021-06-02 LAB — IRON AND TIBC
Iron Saturation: 20 % (ref 15–55)
Iron: 58 ug/dL (ref 27–159)
Total Iron Binding Capacity: 288 ug/dL (ref 250–450)
UIBC: 230 ug/dL (ref 131–425)

## 2021-06-02 LAB — HEMOGLOBIN A1C
Est. average glucose Bld gHb Est-mCnc: 151 mg/dL
Hgb A1c MFr Bld: 6.9 % — ABNORMAL HIGH (ref 4.8–5.6)

## 2021-06-02 LAB — FERRITIN: Ferritin: 273 ng/mL — ABNORMAL HIGH (ref 15–150)

## 2021-06-02 MED ORDER — ALBUTEROL SULFATE HFA 108 (90 BASE) MCG/ACT IN AERS: 1.0000 | INHALATION_SPRAY | Freq: Four times a day (QID) | RESPIRATORY_TRACT | 2 refills | Status: DC | PRN

## 2021-06-02 MED ORDER — LISINOPRIL-HYDROCHLOROTHIAZIDE 20-25 MG PO TABS: 1.0000 | ORAL_TABLET | Freq: Every day | ORAL | 3 refills | Status: DC

## 2021-06-02 NOTE — Telephone Encounter (Signed)
PA  for pt ( ALBUTEROL SULFATE HFA) came through on cover my meds was done and sent back with office notes  from 04/09/20  Awaiting approval or denial

## 2021-06-02 NOTE — Telephone Encounter (Signed)
DECISION :     Approved today   Request Reference Number: [id number]. ALBUTEROL AER HFA is approved through 08/01/2022. Your patient may now fill this prescription and it will be covered.     Drug  Albuterol Sulfate HFA 108 (90 Base)MCG/ACT aerosol Form [employer] Medicare Part D Electronic Prior Authorization Form (2017 [place]CP[place])    ( COPY SENT TO PHARMACY ALSO )

## 2021-06-02 NOTE — Telephone Encounter (Signed)
Next appt scheduled 07/08/21 with PCP.

## 2021-06-04 ENCOUNTER — Other Ambulatory Visit: Payer: Self-pay | Admitting: Internal Medicine

## 2021-06-04 DIAGNOSIS — E1165 Type 2 diabetes mellitus with hyperglycemia: Secondary | ICD-10-CM

## 2021-06-04 MED ORDER — RYBELSUS 7 MG PO TABS: 1.0000 | ORAL_TABLET | Freq: Every day | ORAL | 3 refills | Status: DC

## 2021-06-04 NOTE — Telephone Encounter (Signed)
Pt last seen 06/01/21 by [facility] Last albuterol refill sent 06/02/21 with 2 refills by pcp  Pharmacy (mail-order) now requesting 72mth supply with prn refills on albuterol inhaler.  Will send request to pcp for review Please advise. Regenia Skeeter, Jen Eppinger Cassady11/3/20229:30 AM

## 2021-06-04 NOTE — Telephone Encounter (Signed)
Next appt scheduled 07/08/21 with PCP.

## 2021-06-08 ENCOUNTER — Ambulatory Visit (INDEPENDENT_AMBULATORY_CARE_PROVIDER_SITE_OTHER): Payer: Medicare Other | Admitting: *Deleted

## 2021-06-08 DIAGNOSIS — Z23 Encounter for immunization: Secondary | ICD-10-CM

## 2021-06-10 NOTE — Progress Notes (Signed)
Internal Medicine Clinic Attending  Case discussed with Dr. Jinwala  At the time of the visit.  We reviewed the resident's history and exam and pertinent patient test results.  I agree with the assessment, diagnosis, and plan of care documented in the resident's note.  

## 2021-06-12 DIAGNOSIS — Z9189 Other specified personal risk factors, not elsewhere classified: Secondary | ICD-10-CM

## 2021-06-12 NOTE — Progress Notes (Addendum)
[facility])                                            [place] Team                                        Statin Quality Measure Assessment    06/12/2021  Suzanne Lewis [birth year] [id number]  Per review of chart and payor information, this patient has been flagged for non-adherence to the following CMS Quality Measure:   [x]  Statin Use in Persons with Diabetes  []  Statin Use in Persons with Cardiovascular Disease  The 10-year ASCVD risk score (Arnett DK, et al., 2019) is: 5%   Values used to calculate the score:     Age: 33 years     Sex: Female     Is Non-Hispanic African American: Yes     Diabetic: Yes     Tobacco smoker: No     Systolic Blood Pressure: 856 mmHg     Is BP treated: Yes     HDL Cholesterol: 58 mg/dL     Total Cholesterol: 218 mg/dL     LDL 134 mg/dL (as of 06/01/2021)  Patient's ASCVD risk is 5% but LDL 134.  She is not on a statin as she declined at the last O/v with provider. However, patient was willing to reassess need of statin at the next O/v  should lipid panel be elevated, as per prior documentation. If deemed therapeutically appropriate, please consider statin initiation at the next O/v on 06/15/21.  Please consider ONE of the following recommendations:   Initiate high intensity statin Atorvastatin 40mg  once daily, #90, 3 refills   Rosuvastatin 20mg  once daily, #90, 3 refills    Initiate moderate intensity          statin with reduced frequency if prior          statin intolerance 1x weekly, #13, 3 refills   2x weekly, #26, 3 refills   3x weekly, #39, 3 refills    Code for past statin intolerance or other exclusions (required annually)  Drug Induced Myopathy G72.0   Myositis, unspecified M60.9   Rhabdomyolysis M62.82   Prediabetes R73.03   Adverse effect of antihyperlipidemic and antiarteriosclerotic drugs, initial encounter D14.9F0Y    Thank you for your time,  Kristeen Miss, [place] Cell: [phone]

## 2021-06-15 ENCOUNTER — Encounter: Payer: Medicare Other | Admitting: Student

## 2021-06-16 ENCOUNTER — Other Ambulatory Visit: Payer: Self-pay | Admitting: Internal Medicine

## 2021-06-22 ENCOUNTER — Other Ambulatory Visit: Payer: Self-pay | Admitting: Internal Medicine

## 2021-07-08 ENCOUNTER — Encounter: Payer: Medicare Other | Admitting: Internal Medicine

## 2021-10-16 DIAGNOSIS — G4733 Obstructive sleep apnea (adult) (pediatric): Secondary | ICD-10-CM | POA: Diagnosis not present

## 2021-10-21 ENCOUNTER — Ambulatory Visit (INDEPENDENT_AMBULATORY_CARE_PROVIDER_SITE_OTHER): Payer: Medicare Other | Admitting: Internal Medicine

## 2021-10-21 ENCOUNTER — Other Ambulatory Visit: Payer: Self-pay

## 2021-10-21 ENCOUNTER — Encounter: Payer: Self-pay | Admitting: Internal Medicine

## 2021-10-21 VITALS — BP 114/67 | HR 126 | Temp 98.2°F | Ht 59.0 in | Wt 367.1 lb

## 2021-10-21 DIAGNOSIS — E1165 Type 2 diabetes mellitus with hyperglycemia: Secondary | ICD-10-CM

## 2021-10-21 DIAGNOSIS — E1129 Type 2 diabetes mellitus with other diabetic kidney complication: Secondary | ICD-10-CM

## 2021-10-21 DIAGNOSIS — R809 Proteinuria, unspecified: Secondary | ICD-10-CM

## 2021-10-21 DIAGNOSIS — E785 Hyperlipidemia, unspecified: Secondary | ICD-10-CM | POA: Diagnosis not present

## 2021-10-21 DIAGNOSIS — Z0000 Encounter for general adult medical examination without abnormal findings: Secondary | ICD-10-CM

## 2021-10-21 DIAGNOSIS — E878 Other disorders of electrolyte and fluid balance, not elsewhere classified: Secondary | ICD-10-CM

## 2021-10-21 DIAGNOSIS — E119 Type 2 diabetes mellitus without complications: Secondary | ICD-10-CM

## 2021-10-21 DIAGNOSIS — I10 Essential (primary) hypertension: Secondary | ICD-10-CM | POA: Diagnosis not present

## 2021-10-21 DIAGNOSIS — Z794 Long term (current) use of insulin: Secondary | ICD-10-CM | POA: Diagnosis not present

## 2021-10-21 LAB — GLUCOSE, CAPILLARY: Glucose-Capillary: 152 mg/dL — ABNORMAL HIGH (ref 70–99)

## 2021-10-21 LAB — POCT GLYCOSYLATED HEMOGLOBIN (HGB A1C): Hemoglobin A1C: 5.2 % (ref 4.0–5.6)

## 2021-10-21 MED ORDER — ROSUVASTATIN CALCIUM 20 MG PO TABS: 20.0000 mg | ORAL_TABLET | Freq: Every day | ORAL | 11 refills | Status: DC

## 2021-10-21 MED ORDER — RYBELSUS 7 MG PO TABS: 1.0000 | ORAL_TABLET | Freq: Every day | ORAL | 3 refills | Status: DC

## 2021-10-21 NOTE — Assessment & Plan Note (Signed)
A1c 5.2 today, down from 6.9 four months ago. Home monitoring with fasting glucose ranging 80-150's. Diabetes at goal on current regimen. Tolerating medication without adverse effects. Counseled on the benefits of daily exercise, limiting processed foods and high sugar foods, and weight loss. No hypoglycemic episodes. Foot exam wnl, and no wounds present. ? ?Continue regimen as above  ?Advised that she can discontinue monitoring glucose at home  ?Continue lifestyle modifications ?Foot exam done ? ?

## 2021-10-21 NOTE — Assessment & Plan Note (Addendum)
Patient with BMI 75. Previously discussed weight loss measures, including exercising >150 minutes/week and DASH diet. She is on rybelsus for glycemic control and weight loss benefit, and per chart review, she has had a 10 lb weight loss since last visit.  ?? ?-exercise and DASH diet ?-continue rybelsus  ?

## 2021-10-21 NOTE — Patient Instructions (Addendum)
Continue taking the medications that you have been  ?You can stop measuring your sugars at home now  ?Please start taking Crestor 20 mg daily  ? ?Congratulations on the weight loss! ?I placed referral for eye exam ? ?

## 2021-10-21 NOTE — Assessment & Plan Note (Addendum)
BP 131/95 and repeat 116/67. HR's 128 and repeat 126; has a tx of sinus tachy with previous workup including TSH wnl. She reports feeling very well and at her baseline, and denies CP and SHOB. Reports excellent medication compliance to Lisinopril-HCTZ 20-25 mg daily with no adverse effects. Denies headaches, vision changes, lightheadedness, chest pain, SHOB, leg swelling or changes in speech. Exam benign and vitals otherwise wnl. Counseled on the importance of daily exercise, low salt diet, and weight loss. ? ?Continue current regimen  ?BP log and bring to next visit  ?Continue lifestyle changes ?F/u on BMP ? ?Addendum: ?BMP with normal renal function. Slightly elevated Calcium 11.1, will CTM. Suspect anion gap of 25 may be a lab error, and will repeat during next f/u ?

## 2021-10-21 NOTE — Assessment & Plan Note (Signed)
A1c 4 months ago with LDL 134. Discussed statin therapy, and she is agreeable to starting for primary prevention with a goal LDL <100. Counseled on the importance of continued lifestyle modifications, including: weight loss, daily exercise, and healthy diet with limited processed and fatty foods.  ? ?Start Crestor 20 mg daily   ?Encouraged to continue lifestyle modifications ? ?

## 2021-10-21 NOTE — Progress Notes (Signed)
? ?CC: routine clinic follow up  ? ?HPI: ? ?[name] is a 41 y.o. female with a PMHx stated below and presents today for stated above. Please see the Encounters tab for problem-based Assessment & Plan for additional details.  ? ?Past Medical History:  ?Diagnosis Date  ? Allergic rhinitis   ? Arthritis   ? "RLE; broke my leg as a small child; no OR" (03/29/2018)  ? Diabetes mellitus without complication ([place])   ? Hyperlipidemia   ? Hypertension   ? Iron deficiency anemia   ? Mild intermittent asthma   ? Morbid obesity ([place])   ? OSA on CPAP   ? Right shoulder strain 06/19/2018  ? Tinnitus 06/14/2016  ? Right ear, pulsatile in nature - seen by ENT 12/22/16. DDx included idiopathic, vascular variant, aneurysm, or vascular tumor. Physical exam & hearing test were normal. MRI, MRA, and MRV ordered for further evaluation were all normal.   ? Vitamin D deficiency   ? ? ?Current Outpatient Medications on File Prior to Visit  ?Medication Sig Dispense Refill  ? acetaminophen (TYLENOL) 500 MG tablet Take 1,000 mg by mouth 2 (two) times daily as needed for moderate pain or headache.    ? albuterol (VENTOLIN HFA) 108 (90 Base) MCG/ACT inhaler USE 1 TO 2 INHALATIONS BY MOUTH  EVERY 6 HOURS AS NEEDED FOR  WHEEZING OR SHORTNESS OF BREATH 54 g 3  ? blood glucose meter kit and supplies KIT Dispense based on patient and insurance preference. Use up to four times daily as directed. (FOR ICD-9 250.00, 250.01). 1 each 0  ? cycloSPORINE (RESTASIS) 0.05 % ophthalmic emulsion Place 1 drop into both eyes 2 (two) times daily.    ? diclofenac sodium (VOLTAREN) 1 % GEL Apply 4 g topically 4 (four) times daily as needed. 100 g 1  ? lisinopril-hydrochlorothiazide (ZESTORETIC) 20-25 MG tablet Take 1 tablet by mouth daily. 90 tablet 3  ? metFORMIN (GLUCOPHAGE) 500 MG tablet TAKE 1 TABLET BY MOUTH  TWICE DAILY WITH MEALS 180 tablet 3  ? Olopatadine HCl (PAZEO) 0.7 % SOLN Place 1 drop into both eyes daily.     ? ?No current  facility-administered medications on file prior to visit.  ? ? ?Family History  ?Problem Relation Age of Onset  ? Diabetes Mother   ?     died at age 6  ? Heart failure Mother   ? Hypertension Mother   ? Hypertension Brother   ? ? ?Social History  ? ?Socioeconomic History  ? Marital status: Single  ?  Spouse name: Not on file  ? Number of children: Not on file  ? Years of education: Not on file  ? Highest education level: Not on file  ?Occupational History  ? Not on file  ?Tobacco Use  ? Smoking status: Never  ? Smokeless tobacco: Never  ?Vaping Use  ? Vaping Use: Never used  ?Substance and Sexual Activity  ? Alcohol use: Never  ?  Alcohol/week: 0.0 standard drinks  ? Drug use: Never  ? Sexual activity: Not Currently  ?  Birth control/protection: I.U.D.  ?  Comment: currently has IUD  ?Other Topics Concern  ? Not on file  ?Social History Narrative  ? Not on file  ? ?Social Determinants of Health  ? ?Financial Resource Strain: Not on file  ?Food Insecurity: Not on file  ?Transportation Needs: Not on file  ?Physical Activity: Not on file  ?Stress: Not on file  ?Social Connections: Not on file  ?  Intimate Partner Violence: Not on file  ? ? ?Review of Systems: ?ROS negative except for what is noted on the assessment and plan. ? ?Vitals:  ? 10/21/21 0901 10/21/21 0957  ?BP: (!) 131/95 114/67  ?Pulse: (!) 128 (!) 126  ?Temp: 98.2 ?F (36.8 ?C)   ?TempSrc: Oral   ?SpO2: 98%   ?Weight: (!) 367 lb 1.6 oz (166.5 kg)   ?Height: _0  (1.499 m)   ? ? ?Physical Exam: ?Constitutional: alert, well-appearing, in NAD ?HENT: normocephalic, atraumatic, mucous membranes moist ?Eyes: conjunctiva non-erythematous, EOMI ?Cardiovascular: RRR, no m/r/g, non-edematous bilateral LE ?Pulmonary/Chest: normal work of breathing on RA, LCTAB ?Abdominal: soft, non-tender to palpation, non-distended ?MSK: normal bulk and tone  ?Neurological: A&O x 3 and follows commands  ?Skin: warm and dry  ?Psych: normal behavior, normal affect  ? ?Assessment &  Plan:  ? ?See Encounters Tab for problem based charting. ? ?Patient discussed with Dr. Daryll Drown ? ?Lajean Manes, MD  ?Internal Medicine Resident, PGY-1 ?[facility]  ?

## 2021-10-21 NOTE — Assessment & Plan Note (Addendum)
Foot exam done  ?Referral for ophthalmology placed  ?She defers pap smear to next visit  ?

## 2021-10-22 LAB — BMP8+ANION GAP
Anion Gap: 25 mmol/L — ABNORMAL HIGH (ref 10.0–18.0)
BUN/Creatinine Ratio: 18 (ref 9–23)
BUN: 14 mg/dL (ref 6–24)
CO2: 17 mmol/L — ABNORMAL LOW (ref 20–29)
Calcium: 11.1 mg/dL — ABNORMAL HIGH (ref 8.7–10.2)
Chloride: 94 mmol/L — ABNORMAL LOW (ref 96–106)
Creatinine, Ser: 0.8 mg/dL (ref 0.57–1.00)
Glucose: 133 mg/dL — ABNORMAL HIGH (ref 70–99)
Potassium: 4.4 mmol/L (ref 3.5–5.2)
Sodium: 136 mmol/L (ref 134–144)
eGFR: 95 mL/min/{1.73_m2} (ref >59)

## 2021-10-28 NOTE — Progress Notes (Signed)
Internal Medicine Clinic Attending  Case discussed with Dr. Patel at the time of the visit.  We reviewed the resident's history and exam and pertinent patient test results.  I agree with the assessment, diagnosis, and plan of care documented in the resident's note.  

## 2021-10-29 NOTE — Assessment & Plan Note (Signed)
BMP with mild hypercalcemia of 11.1. Previous labwork (1+ year ago) with stable calcium levels of 9-10. She is not currently on any calcium supplementation or multivitamins. Will order PTH and vitamin D level for further investigation at this time. She reports that she can come in next week for lab only visit. We will follow up in clinic once labs result. ?

## 2021-10-29 NOTE — Addendum Note (Signed)
Addended byLajean Manes on: 10/29/2021 03:35 PM ? ? Modules accepted: Orders ? ?

## 2021-11-22 ENCOUNTER — Other Ambulatory Visit: Payer: Self-pay

## 2021-11-22 ENCOUNTER — Emergency Department (HOSPITAL_COMMUNITY): Payer: Medicare Other

## 2021-11-22 ENCOUNTER — Emergency Department (HOSPITAL_COMMUNITY)
Admission: EM | Admit: 2021-11-22 | Discharge: 2021-11-22 | Disposition: A | Discharge status: Home / Self Care | Payer: Medicare Other | Attending: Emergency Medicine | Admitting: Emergency Medicine

## 2021-11-22 ENCOUNTER — Encounter (HOSPITAL_COMMUNITY): Payer: Self-pay

## 2021-11-22 DIAGNOSIS — Z20822 Contact with and (suspected) exposure to covid-19: Secondary | ICD-10-CM | POA: Insufficient documentation

## 2021-11-22 DIAGNOSIS — E119 Type 2 diabetes mellitus without complications: Secondary | ICD-10-CM | POA: Diagnosis not present

## 2021-11-22 DIAGNOSIS — J069 Acute upper respiratory infection, unspecified: Secondary | ICD-10-CM

## 2021-11-22 DIAGNOSIS — B9789 Other viral agents as the cause of diseases classified elsewhere: Secondary | ICD-10-CM | POA: Diagnosis not present

## 2021-11-22 DIAGNOSIS — R059 Cough, unspecified: Secondary | ICD-10-CM | POA: Diagnosis not present

## 2021-11-22 DIAGNOSIS — Z79899 Other long term (current) drug therapy: Secondary | ICD-10-CM | POA: Diagnosis not present

## 2021-11-22 DIAGNOSIS — I10 Essential (primary) hypertension: Secondary | ICD-10-CM | POA: Insufficient documentation

## 2021-11-22 DIAGNOSIS — R0602 Shortness of breath: Secondary | ICD-10-CM | POA: Diagnosis not present

## 2021-11-22 DIAGNOSIS — Z7984 Long term (current) use of oral hypoglycemic drugs: Secondary | ICD-10-CM | POA: Diagnosis not present

## 2021-11-22 LAB — RESP PANEL BY RT-PCR (FLU A&B, COVID) ARPGX2
Influenza A by PCR: NEGATIVE
Influenza B by PCR: NEGATIVE
SARS Coronavirus 2 by RT PCR: NEGATIVE

## 2021-11-22 LAB — CBG MONITORING, ED: Glucose-Capillary: 103 mg/dL — ABNORMAL HIGH (ref 70–99)

## 2021-11-22 NOTE — ED Triage Notes (Signed)
Reports cough for a few days and now loosing voice.  Patient tachy in triage.  Denies fever.  ?

## 2021-11-22 NOTE — Discharge Instructions (Addendum)
Return to the ED with any new symptoms such as lower extremity swelling, fevers, chest pain or shortness of breath ?Please follow-up with your PCP ?You most likely have a viral upper respiratory infection.  You may treat your symptoms at home utilizing Tylenol for fevers and ibuprofen for body aches.  Please remain hydrated, I recommend Pedialyte. ?Please read the attached informational guide concerning upper respiratory infections ?

## 2021-11-22 NOTE — ED Provider Notes (Signed)
?[facility] ?Provider Note ? ? ?CSN: [id number] ?Arrival date & time: 11/22/21  1015 ? ?  ? ?History ? ?Chief Complaint  ?Patient presents with  ? Cough  ? ? ?Suzanne Lewis is a 41 y.o. female with medical history of morbid obesity, sleep apnea, diabetes, hypertension.  Patient presents ED for evaluation of cough.  Patient states that beginning on Friday she developed a cough.  Patient does endorse a positive sick contact in the form of her boyfriend recently had a cold.  Patient also endorsing a "itchy throat".  Patient denies any fevers, nausea, vomiting, abdominal pain, lightheadedness, dizziness, weakness.  Patient states that for the last 3 days she has been very thirsty, drinking a lot of water.  The patient believes that this may be contributing to her cough.  Patient also denies any lower extremity swelling. ? ? ?Cough ?Associated symptoms: shortness of breath and sore throat   ?Associated symptoms: no chest pain, no chills and no fever   ? ?  ? ?Home Medications ?Prior to Admission medications   ?Medication Sig Start Date End Date Taking? Authorizing Provider  ?acetaminophen (TYLENOL) 500 MG tablet Take 1,000 mg by mouth 2 (two) times daily as needed for moderate pain or headache.    [provider]  ?albuterol (VENTOLIN HFA) 108 (90 Base) MCG/ACT inhaler USE 1 TO 2 INHALATIONS BY MOUTH  EVERY 6 HOURS AS NEEDED FOR  WHEEZING OR SHORTNESS OF BREATH 06/04/21   Velna Ochs, MD  ?blood glucose meter kit and supplies KIT Dispense based on patient and insurance preference. Use up to four times daily as directed. (FOR ICD-9 250.00, 250.01). 04/11/20   Antonietta Breach, PA-C  ?cycloSPORINE (RESTASIS) 0.05 % ophthalmic emulsion Place 1 drop into both eyes 2 (two) times daily.    [provider]  ?diclofenac sodium (VOLTAREN) 1 % GEL Apply 4 g topically 4 (four) times daily as needed. 06/19/18   Velna Ochs, MD  ?lisinopril-hydrochlorothiazide  (ZESTORETIC) 20-25 MG tablet Take 1 tablet by mouth daily. 06/02/21   Velna Ochs, MD  ?metFORMIN (GLUCOPHAGE) 500 MG tablet TAKE 1 TABLET BY MOUTH  TWICE DAILY WITH MEALS 06/23/21   Velna Ochs, MD  ?Olopatadine HCl (PAZEO) 0.7 % SOLN Place 1 drop into both eyes daily.     [provider]  ?rosuvastatin (CRESTOR) 20 MG tablet Take 1 tablet (20 mg total) by mouth daily. 10/21/21 10/21/22  Lajean Manes, MD  ?Semaglutide (RYBELSUS) 7 MG TABS Take 1 tablet by mouth daily. 10/21/21   Lajean Manes, MD  ?   ? ?Allergies    ?Citric acid and Codeine   ? ?Review of Systems   ?Review of Systems  ?Constitutional:  Negative for chills and fever.  ?HENT:  Positive for sore throat.   ?Respiratory:  Positive for cough and shortness of breath.   ?Cardiovascular:  Negative for chest pain and leg swelling.  ?Gastrointestinal:  Negative for abdominal pain, nausea and vomiting.  ?Neurological:  Negative for dizziness, weakness and light-headedness.  ?All other systems reviewed and are negative. ? ?Physical Exam ?Updated Vital Signs ?BP 130/89   Pulse (!) 114   Temp 98.6 ?F (37 ?C) (Oral)   Resp (!) 23   Ht 4' 11"  (1.499 m)   Wt (!) 161 kg   LMP  (LMP Unknown)   SpO2 97%   BMI 71.70 kg/m?  ?Physical Exam ?Vitals and nursing note reviewed.  ?Constitutional:   ?   General: She is not  in acute distress. ?   Appearance: She is obese. She is not ill-appearing, toxic-appearing or diaphoretic.  ?HENT:  ?   Head: Normocephalic and atraumatic.  ?   Nose: Nose normal. No congestion.  ?   Mouth/Throat:  ?   Mouth: Mucous membranes are moist.  ?   Pharynx: Oropharynx is clear. Posterior oropharyngeal erythema present. No oropharyngeal exudate.  ?Eyes:  ?   Extraocular Movements: Extraocular movements intact.  ?   Conjunctiva/sclera: Conjunctivae normal.  ?   Pupils: Pupils are equal, round, and reactive to light.  ?Cardiovascular:  ?   Rate and Rhythm: Normal rate and regular rhythm.  ?Pulmonary:  ?   Effort: Pulmonary  effort is normal.  ?   Breath sounds: Normal breath sounds. No stridor. No wheezing or rhonchi.  ?Chest:  ?   Chest wall: No tenderness.  ?Abdominal:  ?   General: Abdomen is flat. Bowel sounds are normal.  ?   Palpations: Abdomen is soft.  ?   Tenderness: There is no abdominal tenderness.  ?Musculoskeletal:  ?   Cervical back: Normal range of motion and neck supple. No tenderness.  ?Skin: ?   General: Skin is warm and dry.  ?   Capillary Refill: Capillary refill takes less than 2 seconds.  ?Neurological:  ?   Mental Status: She is alert and oriented to person, place, and time.  ? ? ?ED Results / Procedures / Treatments   ?Labs ?(all labs ordered are listed, but only abnormal results are displayed) ?Labs Reviewed  ?CBG MONITORING, ED - Abnormal; Notable for the following components:  ?    Result Value  ? Glucose-Capillary 103 (*)   ? All other components within normal limits  ?RESP PANEL BY RT-PCR (FLU A&B, COVID) [id number]  ? ? ?EKG ?None ? ?Radiology ?DG Chest 2 View ? ?Result Date: 11/22/2021 ?CLINICAL DATA:  Shortness of breath and cough EXAM: CHEST - 2 VIEW COMPARISON:  05/13/2016 FINDINGS: The cardiomediastinal silhouette is unremarkable. There is no evidence of focal airspace disease, pulmonary edema, suspicious pulmonary nodule/mass, pleural effusion, or pneumothorax. No acute bony abnormalities are identified. IMPRESSION: No active cardiopulmonary disease. Electronically Signed   By: Margarette Canada M.D.   On: 11/22/2021 11:35   ? ?Procedures ?Procedures  ? ? ?Medications Ordered in ED ?Medications - No data to display ? ?ED Course/ Medical Decision Making/ A&P ?  ?                        ?Medical Decision Making ?Amount and/or Complexity of Data Reviewed ?Radiology: ordered. ? ? ?41 year old female presents ED for evaluation of cough.  Please see HPI for further details. ? ?On examination, the patient is afebrile.  The patient is tachycardic to 114 however on chart review this patient has a persistent history of  tachycardia, every time she comes into the ED she is tachycardic it seems.  The patient lung sounds are clear bilaterally, the patient is not hypoxic.  The patient's abdomen is soft compressible all 4 quadrants.  The patient has no lower extremity edema. ? ?Patient worked up utilizing the following labs and imaging studies interpreted by me personally: ?- CBG 103 ?- Respiratory panel negative ?- Chest x-ray shows no signs of consolidation, effusion, vascular congestion, mediastinal widening ? ?At this time, most likely cause of patient cough is due to viral upper respiratory infection.  The patient has been encouraged to follow-up with PCP for any ongoing needs.  The  patient has been advised and educated on how to treat her symptoms at home.  The patient was provided with return precautions and she voiced understanding of these.  The patient all her questions answered to her satisfaction.  The patient is stable at this time for discharge home. ? ? ?Final Clinical Impression(s) / ED Diagnoses ?Final diagnoses:  ?Viral URI with cough  ? ? ?Rx / DC Orders ?ED Discharge Orders   ? ? None  ? ?  ? ? ?  ?Azucena Cecil, PA-C ?11/22/21 1211 ? ?  ?Wyvonnia Dusky, MD ?11/22/21 1421 ? ?

## 2021-12-24 ENCOUNTER — Other Ambulatory Visit: Payer: Self-pay | Admitting: Internal Medicine

## 2021-12-24 DIAGNOSIS — I10 Essential (primary) hypertension: Secondary | ICD-10-CM

## 2021-12-31 ENCOUNTER — Other Ambulatory Visit: Payer: Self-pay | Admitting: Internal Medicine

## 2022-01-02 ENCOUNTER — Other Ambulatory Visit: Payer: Self-pay | Admitting: Internal Medicine

## 2022-01-14 DIAGNOSIS — G4733 Obstructive sleep apnea (adult) (pediatric): Secondary | ICD-10-CM | POA: Diagnosis not present

## 2022-03-04 NOTE — Addendum Note (Signed)
Addended by: Jodean Lima on: 03/04/2022 09:36 AM   Modules accepted: Orders

## 2022-03-09 ENCOUNTER — Other Ambulatory Visit: Payer: Self-pay | Admitting: Internal Medicine

## 2022-03-10 ENCOUNTER — Ambulatory Visit (INDEPENDENT_AMBULATORY_CARE_PROVIDER_SITE_OTHER): Payer: Medicare Other | Admitting: Internal Medicine

## 2022-03-10 ENCOUNTER — Other Ambulatory Visit: Payer: Self-pay

## 2022-03-10 ENCOUNTER — Encounter: Payer: Self-pay | Admitting: Internal Medicine

## 2022-03-10 VITALS — BP 130/83 | HR 112 | Temp 97.8°F | Ht 59.0 in | Wt 343.7 lb

## 2022-03-10 DIAGNOSIS — E1129 Type 2 diabetes mellitus with other diabetic kidney complication: Secondary | ICD-10-CM

## 2022-03-10 DIAGNOSIS — Z794 Long term (current) use of insulin: Secondary | ICD-10-CM | POA: Diagnosis not present

## 2022-03-10 DIAGNOSIS — E7841 Elevated Lipoprotein(a): Secondary | ICD-10-CM

## 2022-03-10 DIAGNOSIS — J4520 Mild intermittent asthma, uncomplicated: Secondary | ICD-10-CM

## 2022-03-10 DIAGNOSIS — I10 Essential (primary) hypertension: Secondary | ICD-10-CM | POA: Diagnosis not present

## 2022-03-10 DIAGNOSIS — E119 Type 2 diabetes mellitus without complications: Secondary | ICD-10-CM

## 2022-03-10 DIAGNOSIS — E785 Hyperlipidemia, unspecified: Secondary | ICD-10-CM

## 2022-03-10 LAB — POCT GLYCOSYLATED HEMOGLOBIN (HGB A1C): Hemoglobin A1C: 6 % — AB (ref 4.0–5.6)

## 2022-03-10 LAB — GLUCOSE, CAPILLARY: Glucose-Capillary: 135 mg/dL — ABNORMAL HIGH (ref 70–99)

## 2022-03-10 MED ORDER — SEMAGLUTIDE(0.25 OR 0.5MG/DOS) 2 MG/3ML ~~LOC~~ SOPN: PEN_INJECTOR | SUBCUTANEOUS | 2 refills | Status: DC

## 2022-03-10 MED ORDER — LISINOPRIL-HYDROCHLOROTHIAZIDE 20-25 MG PO TABS: 1.0000 | ORAL_TABLET | Freq: Every day | ORAL | 3 refills | Status: DC

## 2022-03-10 NOTE — Assessment & Plan Note (Signed)
Recently started on crestor 20 mg. Tolerating this well. Rechecking lipid panel today.

## 2022-03-10 NOTE — Progress Notes (Signed)
Subjective:   Patient ID: Suzanne Lewis female   DOB: 12-Oct-1980 41 y.o.   MRN: [id number]  HPI: [name] is a 41 y.o. female with past medical history outlined below here for follow up of HTN and diabetes. For the details of today's visit, please refer to the assessment and plan.  Past Medical History:  Diagnosis Date   Allergic rhinitis    Arthritis    "RLE; broke my leg as a small child; no OR" (03/29/2018)   Diabetes mellitus without complication (HCC)    Hyperlipidemia    Hypertension    Iron deficiency anemia    Mild intermittent asthma    Morbid obesity ([place])    OSA on CPAP    Right shoulder strain 06/19/2018   Tinnitus 06/14/2016   Right ear, pulsatile in nature - seen by ENT 12/22/16. DDx included idiopathic, vascular variant, aneurysm, or vascular tumor. Physical exam & hearing test were normal. MRI, MRA, and MRV ordered for further evaluation were all normal.    Vitamin D deficiency    Current Outpatient Medications  Medication Sig Dispense Refill   Semaglutide,0.25 or 0.5MG/DOS, 2 MG/3ML SOPN Inject 0.25 mg into the skin once a week for 28 days, THEN 0.5 mg once a week for 28 days. 3 mL 2   acetaminophen (TYLENOL) 500 MG tablet Take 1,000 mg by mouth 2 (two) times daily as needed for moderate pain or headache.     albuterol (VENTOLIN HFA) 108 (90 Base) MCG/ACT inhaler USE 1 TO 2 INHALATIONS BY MOUTH  EVERY 6 HOURS AS NEEDED FOR  WHEEZING OR SHORTNESS OF BREATH 72 g 2   blood glucose meter kit and supplies KIT Dispense based on patient and insurance preference. Use up to four times daily as directed. (FOR ICD-9 250.00, 250.01). 1 each 0   cycloSPORINE (RESTASIS) 0.05 % ophthalmic emulsion Place 1 drop into both eyes 2 (two) times daily.     diclofenac sodium (VOLTAREN) 1 % GEL Apply 4 g topically 4 (four) times daily as needed. 100 g 1   lisinopril-hydrochlorothiazide (ZESTORETIC) 20-25 MG tablet Take 1 tablet by mouth daily. 90 tablet 3   Olopatadine HCl  (PAZEO) 0.7 % SOLN Place 1 drop into both eyes daily.      rosuvastatin (CRESTOR) 20 MG tablet Take 1 tablet (20 mg total) by mouth daily. 30 tablet 11   No current facility-administered medications for this visit.   Family History  Problem Relation Age of Onset   Diabetes Mother        died at age 82   Heart failure Mother    Hypertension Mother    Hypertension Brother    Social History   Socioeconomic History   Marital status: Single    Spouse name: Not on file   Number of children: Not on file   Years of education: Not on file   Highest education level: Not on file  Occupational History   Not on file  Tobacco Use   Smoking status: Never   Smokeless tobacco: Never  Vaping Use   Vaping Use: Never used  Substance and Sexual Activity   Alcohol use: Never    Alcohol/week: 0.0 standard drinks of alcohol   Drug use: Never   Sexual activity: Yes    Birth control/protection: I.U.D.    Comment: currently has IUD  Other Topics Concern   Not on file  Social History Narrative   Not on file   Social Determinants of Health  Financial Resource Strain: Not on file  Food Insecurity: Not on file  Transportation Needs: Not on file  Physical Activity: Not on file  Stress: Not on file  Social Connections: Not on file    Review of Systems: ROS   Objective:  Physical Exam:  Vitals:   03/10/22 0842  BP: 130/83  Pulse: (!) 112  Temp: 97.8 F (36.6 C)  TempSrc: Oral  SpO2: 98%  Height: 4' 11"  (1.499 m)    Constitutional: Obese, well appearing  Cardiovascular: RRR, no m/r/g Pulmonary/Chest: Distant breath sounds but no wheezing, normal effort Extremities: Warm, no edema   Assessment & Plan:   Type 2 diabetes mellitus with microalbuminuria, without long-term current use of insulin (HCC) Well controlled, Hgb A1c today is 6.0. She reports fasting AM CBGs of 120-130. She is currently taking metformin 500 mg BID and oral semaglutide 7 mg daily. We discussed switching to  the injection formulation of semaglutide for additional weight loss benefit. Metformin is causing diarrhea. Since she is well controlled, will d/c this and plan to uptitrate ozempic.  -- Stop metformin and rybelsus -- Start ozempic 0.25 mg qweekly x 4 weeks, then 0.5 mg weekly  -- Follow up 4 weeks for telehealth and 3 months in person for repeat HgbA1c   Hyperlipidemia Recently started on crestor 20 mg. Tolerating this well. Rechecking lipid panel today.   Hypercalcemia Follow up CMP.   Essential hypertension At goal on lisinopril-hctz 20-25 mg daily. Checking CMP.   Class 3 obesity (HCC) Starting ozempic. Checking CMP.   Mild intermittent asthma Continue PRN albuterol.

## 2022-03-10 NOTE — Assessment & Plan Note (Signed)
Follow up CMP

## 2022-03-10 NOTE — Assessment & Plan Note (Signed)
Starting ozempic. Checking CMP.

## 2022-03-10 NOTE — Patient Instructions (Addendum)
Suzanne Lewis,  It was a pleasure to see you today. For your diabetes, you can stop taking metformin and rybelsus. Instead, please start ozempic (aka semaglutide) injections once a week. Please inject 0.25 mg once a week for 4 weeks, then increase to 0.5 mg once a week. Follow up with me in 4 weeks for a telehealth visit and again in person in 3 months for a repeat Hgb A1c. I will call you with the results of your blood work today. Continue to take your other medicine as prescribed.  If you have any questions or concerns, call our clinic at [phone] or after hours call [phone] and ask for the internal medicine resident on call.   Thank you!  Dr. Darnell Level

## 2022-03-10 NOTE — Assessment & Plan Note (Signed)
At goal on lisinopril-hctz 20-25 mg daily. Checking CMP.

## 2022-03-10 NOTE — Assessment & Plan Note (Signed)
Well controlled, Hgb A1c today is 6.0. She reports fasting AM CBGs of 120-130. She is currently taking metformin 500 mg BID and oral semaglutide 7 mg daily. We discussed switching to the injection formulation of semaglutide for additional weight loss benefit. Metformin is causing diarrhea. Since she is well controlled, will d/c this and plan to uptitrate ozempic.  -- Stop metformin and rybelsus -- Start ozempic 0.25 mg qweekly x 4 weeks, then 0.5 mg weekly  -- Follow up 4 weeks for telehealth and 3 months in person for repeat HgbA1c

## 2022-03-10 NOTE — Assessment & Plan Note (Signed)
Continue PRN albuterol.

## 2022-03-11 LAB — LIPID PANEL
Chol/HDL Ratio: 2.9 ratio (ref 0.0–4.4)
Cholesterol, Total: 143 mg/dL (ref 100–199)
HDL: 50 mg/dL (ref >39)
LDL Chol Calc (NIH): 74 mg/dL (ref 0–99)
Triglycerides: 104 mg/dL (ref 0–149)
VLDL Cholesterol Cal: 19 mg/dL (ref 5–40)

## 2022-03-11 LAB — CMP14 + ANION GAP
ALT: 35 IU/L — ABNORMAL HIGH (ref 0–32)
AST: 35 IU/L (ref 0–40)
Albumin/Globulin Ratio: 1.3 (ref 1.2–2.2)
Albumin: 4.4 g/dL (ref 3.9–4.9)
Alkaline Phosphatase: 76 IU/L (ref 44–121)
Anion Gap: 19 mmol/L — ABNORMAL HIGH (ref 10.0–18.0)
BUN/Creatinine Ratio: 13 (ref 9–23)
BUN: 9 mg/dL (ref 6–24)
Bilirubin Total: 0.3 mg/dL (ref 0.0–1.2)
CO2: 20 mmol/L (ref 20–29)
Calcium: 9.9 mg/dL (ref 8.7–10.2)
Chloride: 96 mmol/L (ref 96–106)
Creatinine, Ser: 0.71 mg/dL (ref 0.57–1.00)
Globulin, Total: 3.5 g/dL (ref 1.5–4.5)
Glucose: 106 mg/dL — ABNORMAL HIGH (ref 70–99)
Potassium: 4.3 mmol/L (ref 3.5–5.2)
Sodium: 135 mmol/L (ref 134–144)
Total Protein: 7.9 g/dL (ref 6.0–8.5)
eGFR: 109 mL/min/{1.73_m2} (ref >59)

## 2022-03-16 ENCOUNTER — Other Ambulatory Visit: Payer: Self-pay | Admitting: Internal Medicine

## 2022-03-16 DIAGNOSIS — E119 Type 2 diabetes mellitus without complications: Secondary | ICD-10-CM

## 2022-03-16 NOTE — Telephone Encounter (Signed)
I'm confused, why was this refused?

## 2022-03-16 NOTE — Telephone Encounter (Signed)
Call to pharmacy they said that the present prescription has ben filled and any new ones will be filled.

## 2022-03-31 ENCOUNTER — Encounter: Payer: Self-pay | Admitting: Internal Medicine

## 2022-03-31 ENCOUNTER — Ambulatory Visit (INDEPENDENT_AMBULATORY_CARE_PROVIDER_SITE_OTHER): Payer: Medicare Other | Admitting: Internal Medicine

## 2022-03-31 ENCOUNTER — Other Ambulatory Visit: Payer: Self-pay

## 2022-03-31 DIAGNOSIS — Z7985 Long-term (current) use of injectable non-insulin antidiabetic drugs: Secondary | ICD-10-CM | POA: Diagnosis not present

## 2022-03-31 DIAGNOSIS — R809 Proteinuria, unspecified: Secondary | ICD-10-CM

## 2022-03-31 DIAGNOSIS — Z794 Long term (current) use of insulin: Secondary | ICD-10-CM

## 2022-03-31 DIAGNOSIS — E119 Type 2 diabetes mellitus without complications: Secondary | ICD-10-CM | POA: Diagnosis not present

## 2022-03-31 DIAGNOSIS — E1129 Type 2 diabetes mellitus with other diabetic kidney complication: Secondary | ICD-10-CM | POA: Diagnosis not present

## 2022-03-31 MED ORDER — SEMAGLUTIDE (1 MG/DOSE) 4 MG/3ML ~~LOC~~ SOPN: PEN_INJECTOR | SUBCUTANEOUS | 0 refills | Status: DC

## 2022-03-31 NOTE — Progress Notes (Signed)
    This is a telephone encounter between [place] and Suzanne Lewis on 03/31/2022 for follow up of diabetes and weight loss after initiation of ozempic. The visit was conducted with the patient located at home and Suzanne Lewis at [facility]. The patient's identity was confirmed using their DOB and current address. The patient consented to being evaluated through a telephone encounter and understands the associated limitations (an examination cannot be done and the patient may need to come in for an appointment). I personally spent 11 minutes on medical discussion.   CC: Weight loss follow up  HPI:  [name] is a 41 y.o. female with type II DM and class 3 obesity. At her last visit, we discontinued metformin and rybelsus and started once weekly injectable semaglutide. She has been doing very well on this medicine. Reports a decrease in her appetite and fasting CBGs between 90-100. She denies any GI side effects. Her weight today on her home scale is 332 lbs. This is down from her office weight of 343 lbs at her last in person visit. She completed 4 weeks of the 0.25 mg dose and increased to the 0.5 mg dose this past Sunday the 27th.    Assessment & Plan:   Type 2 diabetes mellitus with microalbuminuria, without long-term current use of insulin (HCC) Doing well on 0.5 mg Ozempic. Reports fasting CBGs between 90-100. No side effects. Plan to continue up titration for optimal weight loss effect. Continue 0.5 mg x 4 weeks then increase to 1 mg weekly. Follow up 1 month for telehealth then in person in 2 months for repeat Hgb A1c.   Class 3 obesity (HCC) Excellent response to low dose ozempic thus far. Down ~11 lbs in one month. Recently increased her dose to 0.5 mg weekly without side effect. Plan to continue up titration, new Rx sent to pharmacy. Continue 0.5 mg dose x 4 weeks then increase to 1 mg weekly if doing well. Follow up telehealth in 4 weeks.

## 2022-03-31 NOTE — Assessment & Plan Note (Signed)
Doing well on 0.5 mg Ozempic. Reports fasting CBGs between 90-100. No side effects. Plan to continue up titration for optimal weight loss effect. Continue 0.5 mg x 4 weeks then increase to 1 mg weekly. Follow up 1 month for telehealth then in person in 2 months for repeat Hgb A1c.

## 2022-03-31 NOTE — Assessment & Plan Note (Signed)
Excellent response to low dose ozempic thus far. Down ~11 lbs in one month. Recently increased her dose to 0.5 mg weekly without side effect. Plan to continue up titration, new Rx sent to pharmacy. Continue 0.5 mg dose x 4 weeks then increase to 1 mg weekly if doing well. Follow up telehealth in 4 weeks.

## 2022-04-01 ENCOUNTER — Other Ambulatory Visit: Payer: Self-pay | Admitting: Internal Medicine

## 2022-04-01 DIAGNOSIS — E119 Type 2 diabetes mellitus without complications: Secondary | ICD-10-CM

## 2022-04-14 DIAGNOSIS — G4733 Obstructive sleep apnea (adult) (pediatric): Secondary | ICD-10-CM | POA: Diagnosis not present

## 2022-04-17 ENCOUNTER — Other Ambulatory Visit: Payer: Self-pay | Admitting: Internal Medicine

## 2022-04-17 DIAGNOSIS — E119 Type 2 diabetes mellitus without complications: Secondary | ICD-10-CM

## 2022-04-19 NOTE — Telephone Encounter (Signed)
I sent new semaglutide Rx on 8/30 for increased dose of 0.5 mg x 4 weeks then 1 mg x 4 weeks. Does she need another prescription?

## 2022-04-20 NOTE — Telephone Encounter (Signed)
Call to patient.  Has 1 full box left.  Does not need any right now.

## 2022-04-21 ENCOUNTER — Other Ambulatory Visit: Payer: Self-pay

## 2022-04-21 ENCOUNTER — Encounter: Payer: Self-pay | Admitting: Internal Medicine

## 2022-04-21 ENCOUNTER — Ambulatory Visit (INDEPENDENT_AMBULATORY_CARE_PROVIDER_SITE_OTHER): Payer: Medicare Other | Admitting: Internal Medicine

## 2022-04-21 DIAGNOSIS — E1129 Type 2 diabetes mellitus with other diabetic kidney complication: Secondary | ICD-10-CM

## 2022-04-21 DIAGNOSIS — R809 Proteinuria, unspecified: Secondary | ICD-10-CM | POA: Diagnosis not present

## 2022-04-21 NOTE — Progress Notes (Signed)
    This is a telephone encounter between [place] and Velna Ochs on 04/21/2022 for follow up of diabetes and weight loss on ozempic. The visit was conducted with the patient located at home and Velna Ochs at [facility]. The patient's identity was confirmed using their DOB and current address. The patient has consented to being evaluated through a telephone encounter and understands the associated risks (an examination cannot be done and the patient may need to come in for an appointment) / benefits (allows the patient to remain at home, decreasing exposure to coronavirus). I personally spent 12 minutes on medical discussion.   CC: Weight loss follow up on ozempic   HPI:  [name] is a 41 y.o. female with type II DM and class 3 obesity. She is doing very well on ozempic. She is down another 11 lbs since our last telehealth encounter, and 22 lbs overall since starting ozempic (343 lbs -> 332 lbs -> 321 lbs). She recently increased to the 0.5 mg dose this past week, she denies nausea and GI upset. Her fasting CBGs have been in the 90s. She had one borderline episode of hypoglycemia with CBG in the 70s. She reports feeling "mildly" bad during this episode, but otherwise has not had any true hypoglycemic events.    Assessment & Plan:   Class 3 obesity (HCC) Continues to do very well on Ozempic, down another 11 lbs and 22 lbs overall. Plan to continue current titration schedule; she recently increased to 0.5 mg weekly. Instructed her to continue this dose for a total of 4 weeks then increase to 1 mg if doing well without side effects. Plan for return in person visit in 6 weeks.   Type 2 diabetes mellitus with microalbuminuria, without long-term current use of insulin (HCC) Fasting CBGs are well controlled on ozempic. Pan for return in person visit in 6 weeks for repeat Hgb A1c.

## 2022-04-21 NOTE — Assessment & Plan Note (Signed)
Continues to do very well on Ozempic, down another 11 lbs and 22 lbs overall. Plan to continue current titration schedule; she recently increased to 0.5 mg weekly. Instructed her to continue this dose for a total of 4 weeks then increase to 1 mg if doing well without side effects. Plan for return in person visit in 6 weeks.

## 2022-04-21 NOTE — Assessment & Plan Note (Signed)
Fasting CBGs are well controlled on ozempic. Pan for return in person visit in 6 weeks for repeat Hgb A1c.

## 2022-04-26 ENCOUNTER — Other Ambulatory Visit: Payer: Self-pay | Admitting: Internal Medicine

## 2022-04-26 DIAGNOSIS — E119 Type 2 diabetes mellitus without complications: Secondary | ICD-10-CM

## 2022-05-04 ENCOUNTER — Other Ambulatory Visit: Payer: Self-pay | Admitting: Internal Medicine

## 2022-05-04 DIAGNOSIS — E119 Type 2 diabetes mellitus without complications: Secondary | ICD-10-CM

## 2022-05-05 ENCOUNTER — Other Ambulatory Visit: Payer: Self-pay | Admitting: Internal Medicine

## 2022-05-05 DIAGNOSIS — E119 Type 2 diabetes mellitus without complications: Secondary | ICD-10-CM

## 2022-05-05 DIAGNOSIS — Z794 Long term (current) use of insulin: Secondary | ICD-10-CM

## 2022-05-06 MED ORDER — SEMAGLUTIDE (1 MG/DOSE) 4 MG/3ML ~~LOC~~ SOPN: 1.0000 mg | PEN_INJECTOR | SUBCUTANEOUS | 0 refills | Status: DC

## 2022-05-06 NOTE — Telephone Encounter (Signed)
Approved ozempic refill, increased to 1 mg dose.

## 2022-05-08 ENCOUNTER — Other Ambulatory Visit: Payer: Self-pay | Admitting: Internal Medicine

## 2022-05-08 DIAGNOSIS — E119 Type 2 diabetes mellitus without complications: Secondary | ICD-10-CM

## 2022-05-16 ENCOUNTER — Other Ambulatory Visit: Payer: Self-pay | Admitting: Internal Medicine

## 2022-05-19 ENCOUNTER — Other Ambulatory Visit: Payer: Self-pay | Admitting: Internal Medicine

## 2022-05-19 NOTE — Telephone Encounter (Signed)
It looks like I just filled this a week ago. Does she need another prescription?

## 2022-05-19 NOTE — Telephone Encounter (Signed)
We are still titrating her dose.

## 2022-05-21 NOTE — Telephone Encounter (Signed)
Dosing is being titrated.  Patient has 2 total left. Patient voiced understanding that doses may be changing.  Reason why a years prescription may be difficult to do at this time.

## 2022-05-30 ENCOUNTER — Other Ambulatory Visit: Payer: Self-pay | Admitting: Internal Medicine

## 2022-06-01 NOTE — Telephone Encounter (Signed)
We are still titrating the dose so I changed to a short term supply.

## 2022-06-01 NOTE — Telephone Encounter (Signed)
Next appt scheduled 06/16/22 with PCP.

## 2022-06-02 DIAGNOSIS — G4733 Obstructive sleep apnea (adult) (pediatric): Secondary | ICD-10-CM | POA: Diagnosis not present

## 2022-06-07 ENCOUNTER — Ambulatory Visit (INDEPENDENT_AMBULATORY_CARE_PROVIDER_SITE_OTHER): Payer: Medicare Other | Admitting: *Deleted

## 2022-06-07 DIAGNOSIS — Z23 Encounter for immunization: Secondary | ICD-10-CM

## 2022-06-15 ENCOUNTER — Other Ambulatory Visit: Payer: Self-pay | Admitting: Internal Medicine

## 2022-06-16 ENCOUNTER — Ambulatory Visit (INDEPENDENT_AMBULATORY_CARE_PROVIDER_SITE_OTHER): Payer: Medicare Other | Admitting: Internal Medicine

## 2022-06-16 ENCOUNTER — Other Ambulatory Visit: Payer: Self-pay

## 2022-06-16 ENCOUNTER — Encounter: Payer: Self-pay | Admitting: Internal Medicine

## 2022-06-16 ENCOUNTER — Other Ambulatory Visit: Payer: Self-pay | Admitting: Internal Medicine

## 2022-06-16 VITALS — BP 117/91 | HR 108 | Temp 97.9°F | Ht 59.0 in | Wt 325.8 lb

## 2022-06-16 DIAGNOSIS — S86891A Other injury of other muscle(s) and tendon(s) at lower leg level, right leg, initial encounter: Secondary | ICD-10-CM | POA: Diagnosis not present

## 2022-06-16 DIAGNOSIS — E119 Type 2 diabetes mellitus without complications: Secondary | ICD-10-CM

## 2022-06-16 DIAGNOSIS — R809 Proteinuria, unspecified: Secondary | ICD-10-CM

## 2022-06-16 DIAGNOSIS — Z794 Long term (current) use of insulin: Secondary | ICD-10-CM | POA: Diagnosis not present

## 2022-06-16 DIAGNOSIS — E1129 Type 2 diabetes mellitus with other diabetic kidney complication: Secondary | ICD-10-CM | POA: Diagnosis not present

## 2022-06-16 DIAGNOSIS — S86899A Other injury of other muscle(s) and tendon(s) at lower leg level, unspecified leg, initial encounter: Secondary | ICD-10-CM | POA: Insufficient documentation

## 2022-06-16 DIAGNOSIS — J4520 Mild intermittent asthma, uncomplicated: Secondary | ICD-10-CM | POA: Diagnosis not present

## 2022-06-16 DIAGNOSIS — I10 Essential (primary) hypertension: Secondary | ICD-10-CM

## 2022-06-16 DIAGNOSIS — Z6841 Body Mass Index (BMI) 40.0 and over, adult: Secondary | ICD-10-CM

## 2022-06-16 LAB — GLUCOSE, CAPILLARY: Glucose-Capillary: 95 mg/dL (ref 70–99)

## 2022-06-16 LAB — POCT GLYCOSYLATED HEMOGLOBIN (HGB A1C): Hemoglobin A1C: 6 % — AB (ref 4.0–5.6)

## 2022-06-16 MED ORDER — DICLOFENAC SODIUM 1 % EX GEL: 4.0000 g | Freq: Four times a day (QID) | CUTANEOUS | Status: AC | PRN

## 2022-06-16 MED ORDER — ALBUTEROL SULFATE HFA 108 (90 BASE) MCG/ACT IN AERS: 1.0000 | INHALATION_SPRAY | RESPIRATORY_TRACT | 2 refills | Status: DC | PRN

## 2022-06-16 MED ORDER — OZEMPIC (2 MG/DOSE) 8 MG/3ML ~~LOC~~ SOPN: 2.0000 mg | PEN_INJECTOR | SUBCUTANEOUS | 2 refills | Status: DC

## 2022-06-16 NOTE — Progress Notes (Signed)
Subjective:   Patient ID: Suzanne Lewis female   DOB: Mar 27, 1981 41 y.o.   MRN: [id number]  HPI: [name] is a 41 y.o. female with past medical history outlined below here for follow up of obesity and weight loss. For the details of today's visit, please refer to the assessment and plan.   Past Medical History:  Diagnosis Date   Allergic rhinitis    Arthritis    "RLE; broke my leg as a small child; no OR" (03/29/2018)   Diabetes mellitus without complication (HCC)    Hyperlipidemia    Hypertension    Iron deficiency anemia    Mild intermittent asthma    Morbid obesity ([place])    OSA on CPAP    Right shoulder strain 06/19/2018   Tinnitus 06/14/2016   Right ear, pulsatile in nature - seen by ENT 12/22/16. DDx included idiopathic, vascular variant, aneurysm, or vascular tumor. Physical exam & hearing test were normal. MRI, MRA, and MRV ordered for further evaluation were all normal.    Vitamin D deficiency    Current Outpatient Medications  Medication Sig Dispense Refill   Semaglutide, 2 MG/DOSE, (OZEMPIC, 2 MG/DOSE,) 8 MG/3ML SOPN Inject 2 mg into the skin once a week. 3 mL 2   acetaminophen (TYLENOL) 500 MG tablet Take 1,000 mg by mouth 2 (two) times daily as needed for moderate pain or headache.     albuterol (VENTOLIN HFA) 108 (90 Base) MCG/ACT inhaler Inhale 1-2 puffs into the lungs every 4 (four) hours as needed for wheezing or shortness of breath. 72 g 2   blood glucose meter kit and supplies KIT Dispense based on patient and insurance preference. Use up to four times daily as directed. (FOR ICD-9 250.00, 250.01). 1 each 0   cycloSPORINE (RESTASIS) 0.05 % ophthalmic emulsion Place 1 drop into both eyes 2 (two) times daily.     diclofenac sodium (VOLTAREN) 1 % GEL Apply 4 g topically 4 (four) times daily as needed. 100 g 1   lisinopril-hydrochlorothiazide (ZESTORETIC) 20-25 MG tablet Take 1 tablet by mouth daily. 90 tablet 3   Olopatadine HCl (PAZEO) 0.7 % SOLN Place 1  drop into both eyes daily.      rosuvastatin (CRESTOR) 20 MG tablet Take 1 tablet (20 mg total) by mouth daily. 30 tablet 11   Semaglutide, 1 MG/DOSE, (OZEMPIC, 1 MG/DOSE,) 4 MG/3ML SOPN INJECT SUBCUTANEOUSLY 1 MG EVERY WEEK FOR 28 DAYS 3 mL 0   Current Facility-Administered Medications  Medication Dose Route Frequency Provider Last Rate Last Admin   diclofenac Sodium (VOLTAREN) 1 % topical gel 4 g  4 g Topical QID PRN Velna Ochs, MD       Family History  Problem Relation Age of Onset   Diabetes Mother        died at age 52   Heart failure Mother    Hypertension Mother    Hypertension Brother    Social History   Socioeconomic History   Marital status: Single    Spouse name: Not on file   Number of children: Not on file   Years of education: Not on file   Highest education level: Not on file  Occupational History   Not on file  Tobacco Use   Smoking status: Never   Smokeless tobacco: Never  Vaping Use   Vaping Use: Never used  Substance and Sexual Activity   Alcohol use: Never    Alcohol/week: 0.0 standard drinks of alcohol   Drug use: Never  Sexual activity: Yes    Birth control/protection: I.U.D.    Comment: currently has IUD  Other Topics Concern   Not on file  Social History Narrative   Not on file   Social Determinants of Health   Financial Resource Strain: Low Risk  (06/16/2022)   Overall Financial Resource Strain (CARDIA)    Difficulty of Paying Living Expenses: Not hard at all  Food Insecurity: No Food Insecurity (06/16/2022)   Hunger Vital Sign    Worried About Running Out of Food in the Last Year: Never true    Ran Out of Food in the Last Year: Never true  Transportation Needs: No Transportation Needs (06/16/2022)   PRAPARE - Hydrologist (Medical): No    Lack of Transportation (Non-Medical): No  Physical Activity: Not on file  Stress: Not on file  Social Connections: Moderately Integrated (06/16/2022)   Social  Connection and Isolation Panel [NHANES]    Frequency of Communication with Friends and Family: More than three times a week    Frequency of Social Gatherings with Friends and Family: More than three times a week    Attends Religious Services: 1 to 4 times per year    Active Member of [employer] or Organizations: No    Attends Archivist Meetings: Never    Marital Status: Living with partner    Objective:  Physical Exam:  Vitals:   06/16/22 0920  BP: (!) 117/91  Pulse: (!) 108  Temp: 97.9 F (36.6 C)  TempSrc: Oral  SpO2: 98%  Weight: (!) 325 lb 12.8 oz (147.8 kg)  Height: _0  (1.499 m)    Constitutional: Well appearing, NAD Cardiovascular: tachycardic but regular rhythm, no m/r/g Pulmonary/Chest: Clear bilaterally, normal effort Psychiatric: Normal mood and affect  Assessment & Plan:   Mild intermittent asthma Symptoms are overall relatively well controlled, however she reports worsening of her symptoms during the winter months.  Still using her inhaler infrequently.  Refilled PRN albuterol.   Type 2 diabetes mellitus with microalbuminuria, without long-term current use of insulin (HCC) Well-controlled, hemoglobin A1c today stable at 6.0.  We are uptitrating her semaglutide to 2 mg a day for maximal weight loss benefit.  Follow-up in 3 months.  Medial tibial stress syndrome Patient reports pain over her anterior shin that started acutely 2 days ago.  She has been exercising regularly and doing a lot of stair climbing.  Suspect this is a "shin splint" due to her increased physical activity.  Advised light activity for the next couple of days, suggested she focus on upper body exercises to allow for recovery.  Provided prescription for topical Voltaren gel PRN.  Essential hypertension Chronic and well-controlled on lisinopril-HCTZ 20-25 mg daily.  Continue current regimen.  Class 3 obesity (HCC) Patient has lost 18 pounds since starting semaglutide 3 months ago.  She  is also exercising regularly. She is very happy with her progress.  BMI today 65.8.  Plan to continue with up titration of semaglutide.  Sent new Rx for 2 mg subcutaneous weekly.  Follow-up in 3 months.

## 2022-06-16 NOTE — Assessment & Plan Note (Signed)
Well-controlled, hemoglobin A1c today stable at 6.0.  We are uptitrating her semaglutide to 2 mg a day for maximal weight loss benefit.  Follow-up in 3 months.

## 2022-06-16 NOTE — Assessment & Plan Note (Signed)
Symptoms are overall relatively well controlled, however she reports worsening of her symptoms during the winter months.  Still using her inhaler infrequently.  Refilled PRN albuterol.

## 2022-06-16 NOTE — Assessment & Plan Note (Signed)
Patient reports pain over her anterior shin that started acutely 2 days ago.  She has been exercising regularly and doing a lot of stair climbing.  Suspect this is a "shin splint" due to her increased physical activity.  Advised light activity for the next couple of days, suggested she focus on upper body exercises to allow for recovery.  Provided prescription for topical Voltaren gel PRN.

## 2022-06-16 NOTE — Assessment & Plan Note (Signed)
Chronic and well-controlled on lisinopril-HCTZ 20-25 mg daily.  Continue current regimen.

## 2022-06-16 NOTE — Assessment & Plan Note (Signed)
Patient has lost 18 pounds since starting semaglutide 3 months ago.  She is also exercising regularly. She is very happy with her progress.  BMI today 65.8.  Plan to continue with up titration of semaglutide.  Sent new Rx for 2 mg subcutaneous weekly.  Follow-up in 3 months.

## 2022-06-16 NOTE — Patient Instructions (Signed)
Suzanne Lewis,  It was a pleasure to see you today. I have increased your ozempic to 2 mg weekly and sent a new prescription to your pharmacy.   Please follow up with me again in 3 months.  If you have any questions or concerns, call our clinic at [phone] or after hours call [phone] and ask for the internal medicine resident on call.   Thank you!  Dr. Philipp Ovens

## 2022-06-17 ENCOUNTER — Other Ambulatory Visit: Payer: Self-pay | Admitting: Internal Medicine

## 2022-06-17 MED ORDER — OZEMPIC (2 MG/DOSE) 8 MG/3ML ~~LOC~~ SOPN: 2.0000 mg | PEN_INJECTOR | SUBCUTANEOUS | 2 refills | Status: DC

## 2022-06-17 NOTE — Telephone Encounter (Signed)
Metformin refill declined, this was d/c'd at a prior visit. Approved 1 year supply of ozempic but changed rx to reflect new increased dose.

## 2022-06-19 ENCOUNTER — Other Ambulatory Visit: Payer: Self-pay | Admitting: Internal Medicine

## 2022-06-19 DIAGNOSIS — E785 Hyperlipidemia, unspecified: Secondary | ICD-10-CM

## 2022-06-21 NOTE — Telephone Encounter (Signed)
Call to pharmacy.  Patient has refills left.

## 2022-07-08 ENCOUNTER — Other Ambulatory Visit: Payer: Self-pay | Admitting: Internal Medicine

## 2022-07-08 DIAGNOSIS — E785 Hyperlipidemia, unspecified: Secondary | ICD-10-CM

## 2022-07-14 DIAGNOSIS — G4733 Obstructive sleep apnea (adult) (pediatric): Secondary | ICD-10-CM | POA: Diagnosis not present

## 2022-07-15 ENCOUNTER — Other Ambulatory Visit: Payer: Self-pay | Admitting: Internal Medicine

## 2022-07-15 DIAGNOSIS — E785 Hyperlipidemia, unspecified: Secondary | ICD-10-CM

## 2022-07-15 NOTE — Telephone Encounter (Signed)
Next appt scheduled 09/15/22 with PCP.

## 2022-07-16 DIAGNOSIS — G4733 Obstructive sleep apnea (adult) (pediatric): Secondary | ICD-10-CM | POA: Diagnosis not present

## 2022-07-18 ENCOUNTER — Other Ambulatory Visit: Payer: Self-pay | Admitting: Internal Medicine

## 2022-09-02 DIAGNOSIS — G4733 Obstructive sleep apnea (adult) (pediatric): Secondary | ICD-10-CM | POA: Diagnosis not present

## 2022-09-15 ENCOUNTER — Ambulatory Visit (INDEPENDENT_AMBULATORY_CARE_PROVIDER_SITE_OTHER): Payer: 59

## 2022-09-15 ENCOUNTER — Ambulatory Visit (INDEPENDENT_AMBULATORY_CARE_PROVIDER_SITE_OTHER): Payer: 59 | Admitting: Internal Medicine

## 2022-09-15 ENCOUNTER — Other Ambulatory Visit: Payer: Self-pay

## 2022-09-15 ENCOUNTER — Encounter: Payer: Self-pay | Admitting: Internal Medicine

## 2022-09-15 VITALS — BP 102/74 | HR 112 | Temp 97.9°F | Ht 59.0 in | Wt 316.2 lb

## 2022-09-15 DIAGNOSIS — Z6841 Body Mass Index (BMI) 40.0 and over, adult: Secondary | ICD-10-CM

## 2022-09-15 DIAGNOSIS — Z0000 Encounter for general adult medical examination without abnormal findings: Secondary | ICD-10-CM | POA: Diagnosis not present

## 2022-09-15 DIAGNOSIS — E119 Type 2 diabetes mellitus without complications: Secondary | ICD-10-CM

## 2022-09-15 DIAGNOSIS — Z794 Long term (current) use of insulin: Secondary | ICD-10-CM | POA: Diagnosis not present

## 2022-09-15 DIAGNOSIS — I10 Essential (primary) hypertension: Secondary | ICD-10-CM | POA: Diagnosis not present

## 2022-09-15 DIAGNOSIS — D500 Iron deficiency anemia secondary to blood loss (chronic): Secondary | ICD-10-CM

## 2022-09-15 DIAGNOSIS — E1129 Type 2 diabetes mellitus with other diabetic kidney complication: Secondary | ICD-10-CM

## 2022-09-15 DIAGNOSIS — Z7985 Long-term (current) use of injectable non-insulin antidiabetic drugs: Secondary | ICD-10-CM | POA: Diagnosis not present

## 2022-09-15 LAB — POCT GLYCOSYLATED HEMOGLOBIN (HGB A1C): Hemoglobin A1C: 5.8 % — AB (ref 4.0–5.6)

## 2022-09-15 LAB — GLUCOSE, CAPILLARY: Glucose-Capillary: 108 mg/dL — ABNORMAL HIGH (ref 70–99)

## 2022-09-15 MED ORDER — LISINOPRIL 20 MG PO TABS: 20.0000 mg | ORAL_TABLET | Freq: Every day | ORAL | 11 refills | Status: DC

## 2022-09-15 NOTE — Patient Instructions (Addendum)
Suzanne Lewis,  It was a pleasure to see you. Keep up the great work with your diet and weight loss. You look great! Please stop taking your current blood pressure medicine (Zestoric, aka lisinopril-hydrochlorothiazide). I have switched you to lisinopril alone. I will send you a mychart message with your lab results or call you if there is anything abnormal. Please follow up with me again in 6 months. If you have any questions or concerns, call our clinic at [phone] or after hours call [phone] and ask for the internal medicine resident on call.   Thank you!

## 2022-09-15 NOTE — Progress Notes (Signed)
Subjective:   Suzanne Lewis is a 42 y.o. female who presents for Medicare Annual (Subsequent) preventive examination. I connected with  Suzanne Lewis on 09/15/22 by a  Face-To-Face  enabled telemedicine application and verified that I am speaking with the correct person using two identifiers.  Patient Location: Other:  Office/Clinic  Provider Location: Office/Clinic  I discussed the limitations of evaluation and management by telemedicine. The patient expressed understanding and agreed to proceed.  Review of Systems    Defer to PCP       Objective:    Today's Vitals   09/15/22 1152  BP: 102/74  Pulse: (!) 112  Temp: 97.9 F (36.6 C)  TempSrc: Oral  SpO2: 98%  Weight: (!) 316 lb 3.2 oz (143.4 kg)  Height: 4' 11"$  (1.499 m)  PainSc: 0-No pain   Body mass index is 63.86 kg/m.     09/15/2022   11:53 AM 09/15/2022    9:38 AM 06/16/2022    9:23 AM 04/21/2022    9:37 AM 03/31/2022    9:51 AM 03/10/2022    8:40 AM 11/22/2021   10:28 AM  Advanced Directives  Does Patient Have a Medical Advance Directive? Yes Yes No No No No No  Type of Paramedic of [place];Living will [place];Living will       Does patient want to make changes to medical advance directive? No - Patient declined No - Patient declined       Copy of [place] in Chart? No - copy requested No - copy requested       Would patient like information on creating a medical advance directive?  No - Patient declined No - Patient declined No - Patient declined No - Patient declined No - Patient declined No - Patient declined    Current Medications (verified) Outpatient Encounter Medications as of 09/15/2022  Medication Sig   acetaminophen (TYLENOL) 500 MG tablet Take 1,000 mg by mouth 2 (two) times daily as needed for moderate pain or headache.   albuterol (VENTOLIN HFA) 108 (90 Base) MCG/ACT inhaler Inhale 1-2 puffs into the lungs every 4 (four) hours  as needed for wheezing or shortness of breath.   blood glucose meter kit and supplies KIT Dispense based on patient and insurance preference. Use up to four times daily as directed. (FOR ICD-9 250.00, 250.01).   cycloSPORINE (RESTASIS) 0.05 % ophthalmic emulsion Place 1 drop into both eyes 2 (two) times daily.   diclofenac sodium (VOLTAREN) 1 % GEL Apply 4 g topically 4 (four) times daily as needed.   lisinopril (ZESTRIL) 20 MG tablet Take 1 tablet (20 mg total) by mouth daily.   Olopatadine HCl (PAZEO) 0.7 % SOLN Place 1 drop into both eyes daily.    rosuvastatin (CRESTOR) 20 MG tablet TAKE 1 TABLET BY MOUTH DAILY   Semaglutide, 2 MG/DOSE, (OZEMPIC, 2 MG/DOSE,) 8 MG/3ML SOPN Inject 2 mg into the skin once a week.   Facility-Administered Encounter Medications as of 09/15/2022  Medication   diclofenac Sodium (VOLTAREN) 1 % topical gel 4 g    Allergies (verified) Citric acid and Codeine   History: Past Medical History:  Diagnosis Date   Allergic rhinitis    Arthritis    "RLE; broke my leg as a small child; no OR" (03/29/2018)   Diabetes mellitus without complication (HCC)    Hyperlipidemia    Hypertension    Iron deficiency anemia    Mild intermittent asthma  Morbid obesity ([place])    OSA on CPAP    Right shoulder strain 06/19/2018   Tinnitus 06/14/2016   Right ear, pulsatile in nature - seen by ENT 12/22/16. DDx included idiopathic, vascular variant, aneurysm, or vascular tumor. Physical exam & hearing test were normal. MRI, MRA, and MRV ordered for further evaluation were all normal.    Vitamin D deficiency    Past Surgical History:  Procedure Laterality Date   CESAREAN SECTION  2004   INTRAUTERINE DEVICE INSERTION     TONSILLECTOMY AND ADENOIDECTOMY  03/29/2018   TONSILLECTOMY AND ADENOIDECTOMY N/A 03/29/2018   Procedure: TONSILLECTOMY AND ADENOIDECTOMY;  Surgeon: Leta Baptist, MD;  Location: [facility] OR;  Service: ENT;  Laterality: N/A;   Family History  Problem Relation Age of Onset    Diabetes Mother        died at age 63   Heart failure Mother    Hypertension Mother    Hypertension Brother    Social History   Socioeconomic History   Marital status: Single    Spouse name: Not on file   Number of children: Not on file   Years of education: Not on file   Highest education level: Not on file  Occupational History   Not on file  Tobacco Use   Smoking status: Never   Smokeless tobacco: Never  Vaping Use   Vaping Use: Never used  Substance and Sexual Activity   Alcohol use: Never    Alcohol/week: 0.0 standard drinks of alcohol   Drug use: Never   Sexual activity: Yes    Birth control/protection: I.U.D.    Comment: currently has IUD  Other Topics Concern   Not on file  Social History Narrative   Not on file   Social Determinants of Health   Financial Resource Strain: Low Risk  (09/15/2022)   Overall Financial Resource Strain (CARDIA)    Difficulty of Paying Living Expenses: Not hard at all  Food Insecurity: No Food Insecurity (09/15/2022)   Hunger Vital Sign    Worried About Running Out of Food in the Last Year: Never true    Ran Out of Food in the Last Year: Never true  Transportation Needs: No Transportation Needs (09/15/2022)   PRAPARE - Hydrologist (Medical): No    Lack of Transportation (Non-Medical): No  Physical Activity: Inactive (09/15/2022)   Exercise Vital Sign    Days of Exercise per Week: 0 days    Minutes of Exercise per Session: 0 min  Stress: No Stress Concern Present (09/15/2022)   [place]    Feeling of Stress : Not at all  Social Connections: Moderately Integrated (09/15/2022)   Social Connection and Isolation Panel [NHANES]    Frequency of Communication with Friends and Family: More than three times a week    Frequency of Social Gatherings with Friends and Family: More than three times a week    Attends Religious Services: 1 to 4 times  per year    Active Member of [employer] or Organizations: No    Attends Music therapist: Never    Marital Status: Living with partner    Tobacco Counseling Counseling given: Not Answered   Clinical Intake:  Pre-visit preparation completed: Yes  Pain : No/denies pain Pain Score: 0-No pain     Nutritional Risks: None Diabetes: Yes CBG done?: Yes CBG resulted in Enter/ Edit results?: Yes Did pt. bring in CBG monitor from  home?: Yes Glucose Meter Downloaded?: No  How often do you need to have someone help you when you read instructions, pamphlets, or other written materials from your doctor or pharmacy?: 1 - Never What is the last grade level you completed in school?: college  Diabetic?Nutrition Risk Assessment:  Has the patient had any N/V/D within the last 2 months?  No  Does the patient have any non-healing wounds?  No  Has the patient had any unintentional weight loss or weight gain?  No   Diabetes:  Is the patient diabetic?  Yes  If diabetic, was a CBG obtained today?  Yes  Did the patient bring in their glucometer from home?  No  How often do you monitor your CBG's? Every 6 months.   Financial Strains and Diabetes Management:  Are you having any financial strains with the device, your supplies or your medication? No .  Does the patient want to be seen by Chronic Care Management for management of their diabetes?  No  Would the patient like to be referred to a Nutritionist or for Diabetic Management?  No   Diabetic Exams:  Diabetic Eye Exam: Overdue for diabetic eye exam. Pt has been advised about the importance in completing this exam. Patient advised to call and schedule an eye exam. Diabetic Foot Exam: Completed 10/22/2022    Interpreter Needed?: No  Information entered by :: Raivyn Kabler,cma 09/15/22 11:53am   Activities of Daily Living    09/15/2022   11:54 AM 09/15/2022    9:39 AM  In your present state of health, do you have any difficulty  performing the following activities:  Hearing? 0 0  Vision? 0 0  Difficulty concentrating or making decisions? 0 0  Walking or climbing stairs? 0 0  Dressing or bathing? 0 0  Doing errands, shopping? 0 0    Patient Care Team: Velna Ochs, MD as PCP - General  Indicate any recent Medical Services you may have received from other than [facility] providers in the past year (date may be approximate).     Assessment:   This is a routine wellness examination for [facility].  Hearing/Vision screen No results found.  Dietary issues and exercise activities discussed:     Goals Addressed   None   Depression Screen    09/15/2022   11:54 AM 09/15/2022    9:39 AM 06/16/2022    9:23 AM 04/21/2022    9:36 AM 03/31/2022    9:50 AM 03/10/2022    8:40 AM 10/21/2021    9:09 AM  PHQ 2/9 Scores  PHQ - 2 Score 0 0 0 0 0 0 0    Fall Risk    09/15/2022   11:54 AM 09/15/2022    9:39 AM 06/16/2022    9:23 AM 04/21/2022    9:36 AM 03/31/2022    9:50 AM  Fall Risk   Falls in the past year? 0 0 0 0 0  Number falls in past yr: 0 0 0 0 0  Injury with Fall? 0 0 0 0 0  Risk for fall due to : No Fall Risks No Fall Risks No Fall Risks No Fall Risks No Fall Risks  Follow up Falls evaluation completed;Falls prevention discussed Falls evaluation completed;Falls prevention discussed Falls prevention discussed Falls evaluation completed;Falls prevention discussed Falls evaluation completed;Falls prevention discussed    FALL RISK PREVENTION PERTAINING TO THE HOME:  Any stairs in or around the home? Yes  If so, are there any without handrails? No  Home free of loose throw rugs in walkways, pet beds, electrical cords, etc? Yes  Adequate lighting in your home to reduce risk of falls? Yes   ASSISTIVE DEVICES UTILIZED TO PREVENT FALLS:  Life alert? No  Use of a cane, walker or w/c? No  Grab bars in the bathroom? No  Shower chair or bench in shower? No  Elevated toilet seat or a handicapped toilet? No    TIMED UP AND GO:  Was the test performed? Yes .  Length of time to ambulate 10 feet: 1 min.   Gait slow and steady without use of assistive device  Cognitive Function:        09/15/2022   11:54 AM  6CIT Screen  What Year? 0 points  What month? 0 points  What time? 0 points  Count back from 20 0 points  Months in reverse 0 points  Repeat phrase 0 points  Total Score 0 points    Immunizations Immunization History  Administered Date(s) Administered   Influenza,inj,Quad PF,6+ Mos 05/05/2015, 05/10/2016, 05/03/2017, 06/19/2018, 04/09/2020, 06/08/2021, 06/07/2022   Influenza-Unspecified 03/02/2018   PFIZER(Purple Top)SARS-COV-2 Vaccination 02/23/2020, 03/15/2020   Tdap 02/07/2015    TDAP status: Up to date  Flu Vaccine status: Up to date    Covid-19 vaccine status: Completed vaccines  Qualifies for Shingles Vaccine? No   Zostavax completed No   Shingrix Completed?: No.    Education has been provided regarding the importance of this vaccine. Patient has been advised to call insurance company to determine out of pocket expense if they have not yet received this vaccine. Advised may also receive vaccine at local pharmacy or Health Dept. Verbalized acceptance and understanding.  Screening Tests Health Maintenance  Topic Date Due   PAP SMEAR-Modifier  10/05/2019   Diabetic kidney evaluation - Urine ACR  04/16/2021   OPHTHALMOLOGY EXAM  05/22/2021   COVID-19 Vaccine (3 - 2023-24 season) 04/02/2022   FOOT EXAM  10/22/2022   Diabetic kidney evaluation - eGFR measurement  03/11/2023   HEMOGLOBIN A1C  03/16/2023   Medicare Annual Wellness (AWV)  09/16/2023   DTaP/Tdap/Td (2 - Td or Tdap) 02/06/2025   INFLUENZA VACCINE  Completed   Hepatitis C Screening  Completed   HIV Screening  Completed   HPV VACCINES  Aged Out    Health Maintenance  Health Maintenance Due  Topic Date Due   PAP SMEAR-Modifier  10/05/2019   Diabetic kidney evaluation - Urine ACR  04/16/2021    OPHTHALMOLOGY EXAM  05/22/2021   COVID-19 Vaccine (3 - 2023-24 season) 04/02/2022     Lung Cancer Screening: (Low Dose CT Chest recommended if Age 98-80 years, 30 pack-year currently smoking OR have quit w/in 15years.) does not qualify.   Lung Cancer Screening Referral: N/A  Additional Screening:  Hepatitis C Screening: does not qualify; Completed 12/08/2015  Vision Screening: Recommended annual ophthalmology exams for early detection of glaucoma and other disorders of the eye. Is the patient up to date with their annual eye exam?  No  Who is the provider or what is the name of the office in which the patient attends annual eye exams? N/A If pt is not established with a provider, would they like to be referred to a provider to establish care? No .   Dental Screening: Recommended annual dental exams for proper oral hygiene  Community Resource Referral / Chronic Care Management: CRR required this visit?  No   CCM required this visit?  No      Plan:  I have personally reviewed and noted the following in the patient's chart:   Medical and social history Use of alcohol, tobacco or illicit drugs  Current medications and supplements including opioid prescriptions. Patient is not currently taking opioid prescriptions. Functional ability and status Nutritional status Physical activity Advanced directives List of other physicians Hospitalizations, surgeries, and ER visits in previous 12 months Vitals Screenings to include cognitive, depression, and falls Referrals and appointments  In addition, I have reviewed and discussed with patient certain preventive protocols, quality metrics, and best practice recommendations. A written personalized care plan for preventive services as well as general preventive health recommendations were provided to patient.     Kerin Perna, [facility]   09/15/2022   Nurse Notes: Face-To-Face Visit  Ms. Scherer , Thank you for taking time to come for  your Medicare Wellness Visit. I appreciate your ongoing commitment to your health goals. Please review the following plan we discussed and let me know if I can assist you in the future.   These are the goals we discussed:  Goals      Weight < 300 lb (136.079 kg)        This is a list of the screening recommended for you and due dates:  Health Maintenance  Topic Date Due   Pap Smear  10/05/2019   Yearly kidney health urinalysis for diabetes  04/16/2021   Eye exam for diabetics  05/22/2021   COVID-19 Vaccine (3 - 2023-24 season) 04/02/2022   Complete foot exam   10/22/2022   Yearly kidney function blood test for diabetes  03/11/2023   Hemoglobin A1C  03/16/2023   Medicare Annual Wellness Visit  09/16/2023   DTaP/Tdap/Td vaccine (2 - Td or Tdap) 02/06/2025   Flu Shot  Completed   Hepatitis C Screening: USPSTF Recommendation to screen - Ages 18-79 yo.  Completed   HIV Screening  Completed   HPV Vaccine  Aged Out

## 2022-09-15 NOTE — Progress Notes (Unsigned)
Subjective:   Patient ID: Suzanne Lewis female   DOB: Oct 24, 1980 42 y.o.   MRN: [id number]  HPI: [name] is a 42 y.o. female with past medical history outlined below here for diabetes and weight loss. For further details of today's visit, please refer to the assessment and plan below.      Past Medical History:  Diagnosis Date   Allergic rhinitis    Arthritis    "RLE; broke my leg as a small child; no OR" (03/29/2018)   Diabetes mellitus without complication (HCC)    Hyperlipidemia    Hypertension    Iron deficiency anemia    Mild intermittent asthma    Morbid obesity ([place])    OSA on CPAP    Right shoulder strain 06/19/2018   Tinnitus 06/14/2016   Right ear, pulsatile in nature - seen by ENT 12/22/16. DDx included idiopathic, vascular variant, aneurysm, or vascular tumor. Physical exam & hearing test were normal. MRI, MRA, and MRV ordered for further evaluation were all normal.    Vitamin D deficiency    Current Outpatient Medications  Medication Sig Dispense Refill   lisinopril (ZESTRIL) 20 MG tablet Take 1 tablet (20 mg total) by mouth daily. 30 tablet 11   acetaminophen (TYLENOL) 500 MG tablet Take 1,000 mg by mouth 2 (two) times daily as needed for moderate pain or headache.     albuterol (VENTOLIN HFA) 108 (90 Base) MCG/ACT inhaler Inhale 1-2 puffs into the lungs every 4 (four) hours as needed for wheezing or shortness of breath. 72 g 2   blood glucose meter kit and supplies KIT Dispense based on patient and insurance preference. Use up to four times daily as directed. (FOR ICD-9 250.00, 250.01). 1 each 0   cycloSPORINE (RESTASIS) 0.05 % ophthalmic emulsion Place 1 drop into both eyes 2 (two) times daily.     diclofenac sodium (VOLTAREN) 1 % GEL Apply 4 g topically 4 (four) times daily as needed. 100 g 1   Olopatadine HCl (PAZEO) 0.7 % SOLN Place 1 drop into both eyes daily.      rosuvastatin (CRESTOR) 20 MG tablet TAKE 1 TABLET BY MOUTH DAILY 90 tablet 3    Semaglutide, 2 MG/DOSE, (OZEMPIC, 2 MG/DOSE,) 8 MG/3ML SOPN Inject 2 mg into the skin once a week. 9 mL 2   Current Facility-Administered Medications  Medication Dose Route Frequency Provider Last Rate Last Admin   diclofenac Sodium (VOLTAREN) 1 % topical gel 4 g  4 g Topical QID PRN Velna Ochs, MD       Family History  Problem Relation Age of Onset   Diabetes Mother        died at age 75   Heart failure Mother    Hypertension Mother    Hypertension Brother    Social History   Socioeconomic History   Marital status: Single    Spouse name: Not on file   Number of children: Not on file   Years of education: Not on file   Highest education level: Not on file  Occupational History   Not on file  Tobacco Use   Smoking status: Never   Smokeless tobacco: Never  Vaping Use   Vaping Use: Never used  Substance and Sexual Activity   Alcohol use: Never    Alcohol/week: 0.0 standard drinks of alcohol   Drug use: Never   Sexual activity: Yes    Birth control/protection: I.U.D.    Comment: currently has IUD  Other Topics Concern  Not on file  Social History Narrative   Not on file   Social Determinants of Health   Financial Resource Strain: Low Risk  (09/15/2022)   Overall Financial Resource Strain (CARDIA)    Difficulty of Paying Living Expenses: Not hard at all  Food Insecurity: No Food Insecurity (09/15/2022)   Hunger Vital Sign    Worried About Running Out of Food in the Last Year: Never true    Ran Out of Food in the Last Year: Never true  Transportation Needs: No Transportation Needs (09/15/2022)   PRAPARE - Hydrologist (Medical): No    Lack of Transportation (Non-Medical): No  Physical Activity: Inactive (09/15/2022)   Exercise Vital Sign    Days of Exercise per Week: 0 days    Minutes of Exercise per Session: 0 min  Stress: No Stress Concern Present (09/15/2022)   [place]    Feeling of Stress : Not at all  Social Connections: Moderately Integrated (09/15/2022)   Social Connection and Isolation Panel [NHANES]    Frequency of Communication with Friends and Family: More than three times a week    Frequency of Social Gatherings with Friends and Family: More than three times a week    Attends Religious Services: 1 to 4 times per year    Active Member of [employer] or Organizations: No    Attends Archivist Meetings: Never    Marital Status: Living with partner     Objective:  Physical Exam:  Vitals:   09/15/22 0938  BP: 102/74  Pulse: (!) 112  Temp: 97.9 F (36.6 C)  TempSrc: Oral  SpO2: 98%  Weight: (!) 316 lb 3.2 oz (143.4 kg)  Height: 4' 11"$  (1.499 m)    Constitutional: Obese, NAD Cardiovascular: RRR, no m/r/g Pulmonary/Chest: Clear bilaterally, normal effort Extremities: Warm, no edema   Assessment & Plan:   Type 2 diabetes mellitus with microalbuminuria, without long-term current use of insulin ([place]) Patient has had an excellent response to Ozempic, tolerating the 2 mg weekly dose well without side effects.  He is lost a total of 27.5 pounds.  She is very happy with the results.  Hemoglobin A1c is well-controlled at 5.8.  Continue with current regimen.  Follow-up 6 months  Starting weight: 343.7 Starting BMI: 69.42 Today's weight: 316.2 lbs Today's BMI: 63.86 Total weight loss: 27.5 lbs Current dose of ozempic: 2 mg weekly  Side effects: none   Iron deficiency anemia Patient has a history of iron deficiency anemia secondary to menorrhagia.  Now has an IUD in place and no longer menstruates.  We are rechecking CBC and iron studies today.  Currently off iron supplementation.  Essential hypertension Patient has a history of hypertension currently taking lisinopril-HCTZ 20-25 mg daily.  She has lost a significant amount of weight on Ozempic and her blood pressure today is 102/74.  She reports a few episodes of  symptomatic hypotension at home.  Given her weight loss, I do not think she requires 2 agents.  Plan to discontinue hydrochlorothiazide and continue lisinopril 20 mg daily alone.  Follow-up 6 months.

## 2022-09-16 ENCOUNTER — Encounter: Payer: Self-pay | Admitting: Internal Medicine

## 2022-09-16 LAB — CBC WITH DIFFERENTIAL/PLATELET
Basophils Absolute: 0.1 10*3/uL (ref 0.0–0.2)
Basos: 1 %
EOS (ABSOLUTE): 0.2 10*3/uL (ref 0.0–0.4)
Eos: 2 %
Hematocrit: 38.9 % (ref 34.0–46.6)
Hemoglobin: 12.5 g/dL (ref 11.1–15.9)
Immature Grans (Abs): 0.1 10*3/uL (ref 0.0–0.1)
Immature Granulocytes: 1 %
Lymphocytes Absolute: 2.8 10*3/uL (ref 0.7–3.1)
Lymphs: 26 %
MCH: 28.2 pg (ref 26.6–33.0)
MCHC: 32.1 g/dL (ref 31.5–35.7)
MCV: 88 fL (ref 79–97)
Monocytes Absolute: 0.6 10*3/uL (ref 0.1–0.9)
Monocytes: 6 %
Neutrophils Absolute: 7.2 10*3/uL — ABNORMAL HIGH (ref 1.4–7.0)
Neutrophils: 64 %
Platelets: 658 10*3/uL — ABNORMAL HIGH (ref 150–450)
RBC: 4.43 x10E6/uL (ref 3.77–5.28)
RDW: 13.2 % (ref 11.7–15.4)
WBC: 11 10*3/uL — ABNORMAL HIGH (ref 3.4–10.8)

## 2022-09-16 LAB — CMP14 + ANION GAP
ALT: 12 IU/L (ref 0–32)
AST: 13 IU/L (ref 0–40)
Albumin/Globulin Ratio: 1.5 (ref 1.2–2.2)
Albumin: 4.6 g/dL (ref 3.9–4.9)
Alkaline Phosphatase: 80 IU/L (ref 44–121)
Anion Gap: 16 mmol/L (ref 10.0–18.0)
BUN/Creatinine Ratio: 17 (ref 9–23)
BUN: 14 mg/dL (ref 6–24)
Bilirubin Total: 0.3 mg/dL (ref 0.0–1.2)
CO2: 22 mmol/L (ref 20–29)
Calcium: 9.9 mg/dL (ref 8.7–10.2)
Chloride: 97 mmol/L (ref 96–106)
Creatinine, Ser: 0.84 mg/dL (ref 0.57–1.00)
Globulin, Total: 3.1 g/dL (ref 1.5–4.5)
Glucose: 98 mg/dL (ref 70–99)
Potassium: 4.2 mmol/L (ref 3.5–5.2)
Sodium: 135 mmol/L (ref 134–144)
Total Protein: 7.7 g/dL (ref 6.0–8.5)
eGFR: 89 mL/min/{1.73_m2} (ref >59)

## 2022-09-16 LAB — IRON,TIBC AND FERRITIN PANEL
Ferritin: 260 ng/mL — ABNORMAL HIGH (ref 15–150)
Iron Saturation: 14 % — ABNORMAL LOW (ref 15–55)
Iron: 36 ug/dL (ref 27–159)
Total Iron Binding Capacity: 266 ug/dL (ref 250–450)
UIBC: 230 ug/dL (ref 131–425)

## 2022-09-16 NOTE — Assessment & Plan Note (Signed)
Patient has had an excellent response to Ozempic, tolerating the 2 mg weekly dose well without side effects.  He is lost a total of 27.5 pounds.  She is very happy with the results.  Hemoglobin A1c is well-controlled at 5.8.  Continue with current regimen.  Follow-up 6 months  Starting weight: 343.7 Starting BMI: 69.42 Today's weight: 316.2 lbs Today's BMI: 63.86 Total weight loss: 27.5 lbs Current dose of ozempic: 2 mg weekly  Side effects: none

## 2022-09-16 NOTE — Addendum Note (Signed)
Addended by: Jodean Lima on: 09/16/2022 11:55 AM   Modules accepted: Level of Service

## 2022-09-16 NOTE — Assessment & Plan Note (Signed)
Patient has a history of iron deficiency anemia secondary to menorrhagia.  Now has an IUD in place and no longer menstruates.  We are rechecking CBC and iron studies today.  Currently off iron supplementation.

## 2022-09-16 NOTE — Assessment & Plan Note (Signed)
Patient has a history of hypertension currently taking lisinopril-HCTZ 20-25 mg daily.  She has lost a significant amount of weight on Ozempic and her blood pressure today is 102/74.  She reports a few episodes of symptomatic hypotension at home.  Given her weight loss, I do not think she requires 2 agents.  Plan to discontinue hydrochlorothiazide and continue lisinopril 20 mg daily alone.  Follow-up 6 months.

## 2022-09-17 NOTE — Progress Notes (Signed)
Internal Medicine Clinic Attending  Case and documentation reviewed.  I reviewed the AWV findings.  I agree with the assessment, diagnosis, and plan of care documented in the AWV note.     

## 2022-09-22 ENCOUNTER — Other Ambulatory Visit: Payer: Self-pay | Admitting: Internal Medicine

## 2022-09-22 DIAGNOSIS — J4520 Mild intermittent asthma, uncomplicated: Secondary | ICD-10-CM

## 2022-10-14 DIAGNOSIS — G4733 Obstructive sleep apnea (adult) (pediatric): Secondary | ICD-10-CM | POA: Diagnosis not present

## 2022-10-15 DIAGNOSIS — G4733 Obstructive sleep apnea (adult) (pediatric): Secondary | ICD-10-CM | POA: Diagnosis not present

## 2022-12-01 DIAGNOSIS — G4733 Obstructive sleep apnea (adult) (pediatric): Secondary | ICD-10-CM | POA: Diagnosis not present

## 2023-01-12 DIAGNOSIS — G4733 Obstructive sleep apnea (adult) (pediatric): Secondary | ICD-10-CM | POA: Diagnosis not present

## 2023-01-14 DIAGNOSIS — G4733 Obstructive sleep apnea (adult) (pediatric): Secondary | ICD-10-CM | POA: Diagnosis not present

## 2023-02-03 ENCOUNTER — Other Ambulatory Visit: Payer: Self-pay | Admitting: Internal Medicine

## 2023-02-03 DIAGNOSIS — Z794 Long term (current) use of insulin: Secondary | ICD-10-CM

## 2023-02-04 NOTE — Telephone Encounter (Signed)
Please have patient schedule follow up with me next month. Thanks! 

## 2023-02-14 ENCOUNTER — Other Ambulatory Visit: Payer: Self-pay | Admitting: Internal Medicine

## 2023-02-14 DIAGNOSIS — E785 Hyperlipidemia, unspecified: Secondary | ICD-10-CM

## 2023-02-15 ENCOUNTER — Other Ambulatory Visit: Payer: Self-pay | Admitting: *Deleted

## 2023-03-03 DIAGNOSIS — G4733 Obstructive sleep apnea (adult) (pediatric): Secondary | ICD-10-CM | POA: Diagnosis not present

## 2023-03-09 ENCOUNTER — Other Ambulatory Visit: Payer: Self-pay

## 2023-03-09 ENCOUNTER — Ambulatory Visit (INDEPENDENT_AMBULATORY_CARE_PROVIDER_SITE_OTHER): Payer: 59 | Admitting: Internal Medicine

## 2023-03-09 ENCOUNTER — Encounter: Payer: Self-pay | Admitting: Internal Medicine

## 2023-03-09 VITALS — BP 105/64 | HR 104 | Temp 98.3°F | Ht 59.0 in | Wt 312.1 lb

## 2023-03-09 DIAGNOSIS — Z7985 Long-term (current) use of injectable non-insulin antidiabetic drugs: Secondary | ICD-10-CM | POA: Diagnosis not present

## 2023-03-09 DIAGNOSIS — E7841 Elevated Lipoprotein(a): Secondary | ICD-10-CM | POA: Diagnosis not present

## 2023-03-09 DIAGNOSIS — Z0000 Encounter for general adult medical examination without abnormal findings: Secondary | ICD-10-CM

## 2023-03-09 DIAGNOSIS — Z794 Long term (current) use of insulin: Secondary | ICD-10-CM | POA: Diagnosis not present

## 2023-03-09 DIAGNOSIS — E119 Type 2 diabetes mellitus without complications: Secondary | ICD-10-CM | POA: Diagnosis not present

## 2023-03-09 DIAGNOSIS — R809 Proteinuria, unspecified: Secondary | ICD-10-CM | POA: Diagnosis not present

## 2023-03-09 DIAGNOSIS — Z6841 Body Mass Index (BMI) 40.0 and over, adult: Secondary | ICD-10-CM | POA: Diagnosis not present

## 2023-03-09 DIAGNOSIS — I10 Essential (primary) hypertension: Secondary | ICD-10-CM

## 2023-03-09 DIAGNOSIS — E1129 Type 2 diabetes mellitus with other diabetic kidney complication: Secondary | ICD-10-CM | POA: Diagnosis not present

## 2023-03-09 DIAGNOSIS — E785 Hyperlipidemia, unspecified: Secondary | ICD-10-CM | POA: Diagnosis not present

## 2023-03-09 LAB — POCT GLYCOSYLATED HEMOGLOBIN (HGB A1C): Hemoglobin A1C: 5.6 % — AB (ref 4.0–5.6)

## 2023-03-09 LAB — GLUCOSE, CAPILLARY
Glucose-Capillary: 75 mg/dL (ref 70–99)
Glucose-Capillary: 96 mg/dL (ref 70–99)

## 2023-03-09 NOTE — Assessment & Plan Note (Addendum)
Well controlled on low dose lisinopril 20 mg daily. BP today is 105/64. She describes a few episodes of dizziness at home. Does check her BP occasionally and has been low. With her weight loss, we discussed stopping lisinopril to see if she still needs an antihypertensive agent. She will check her BP regularly at home and call if persistently elevated > 130 systolic. Otherwise follow up in 3 months.

## 2023-03-09 NOTE — Progress Notes (Signed)
Hypoglycemic Event  CBG: 75 @0955AM   Treatment: 4 oz juice/soda  Symptoms: None  Follow-up CBG: Time:1020AM CBG Result:96  Possible Reasons for Event: Unknown - stated she ate breakfast.  Comments/MD notified:Yes    Suzanne Lewis H

## 2023-03-09 NOTE — Patient Instructions (Addendum)
Ms. Hauenstein,  It was a pleasure to see you today. Keep up the great work with your diet and exercise. You can stop taking your lisinopril. Please check your blood pressure at home. Follow up with me again in 3 months.   If you have any questions or concerns, call our clinic at [phone] or after hours call [phone] and ask for the internal medicine resident on call.   Thank you!  Dr. Reece Agar

## 2023-03-09 NOTE — Assessment & Plan Note (Signed)
Chronic and well controlled on ozempic 2 mg weekly. Hgb A1c today is 5.6, continues to downtrend with ongoing weight loss. Down ~32 lbs since starting ozempic which is excellent. She is also exercising regularly. Foot exam today is normal. Follow up 3 months for BP. Can repeat Hgb A1c in 6 months.

## 2023-03-09 NOTE — Assessment & Plan Note (Signed)
Due for repeat PAP smear; she has opted to defer this until her next visit.

## 2023-03-09 NOTE — Assessment & Plan Note (Signed)
Taking crestor 20 mg daily. Rechecking lipid panel today.

## 2023-03-09 NOTE — Progress Notes (Signed)
Subjective:   Patient ID: Suzanne Lewis female   DOB: 11-05-1980 42 y.o.   MRN: [id number]  HPI: Suzanne Lewis is a 42 y.o. female with past medical history outlined below here for follow up of diabetes and HTN. For further details of today's visit, please refer to the assessment and plan below.  Past Medical History:  Diagnosis Date   Allergic rhinitis    Arthritis    "RLE; broke my leg as a small child; no OR" (03/29/2018)   Diabetes mellitus without complication (HCC)    Hyperlipidemia    Hypertension    Iron deficiency anemia    Mild intermittent asthma    Morbid obesity (HCC)    OSA on CPAP    Right shoulder strain 06/19/2018   Tinnitus 06/14/2016   Right ear, pulsatile in nature - seen by ENT 12/22/16. DDx included idiopathic, vascular variant, aneurysm, or vascular tumor. Physical exam & hearing test were normal. MRI, MRA, and MRV ordered for further evaluation were all normal.    Vitamin D deficiency    Current Outpatient Medications  Medication Sig Dispense Refill   acetaminophen (TYLENOL) 500 MG tablet Take 1,000 mg by mouth 2 (two) times daily as needed for moderate pain or headache.     albuterol (VENTOLIN HFA) 108 (90 Base) MCG/ACT inhaler USE 1 TO 2 INHALATIONS BY MOUTH  EVERY 4 HOURS AS NEEDED FOR  WHEEZING OR SHORTNESS OF BREATH 108 g 2   blood glucose meter kit and supplies KIT Dispense based on patient and insurance preference. Use up to four times daily as directed. (FOR ICD-9 250.00, 250.01). 1 each 0   cycloSPORINE (RESTASIS) 0.05 % ophthalmic emulsion Place 1 drop into both eyes 2 (two) times daily.     diclofenac sodium (VOLTAREN) 1 % GEL Apply 4 g topically 4 (four) times daily as needed. 100 g 1   Olopatadine HCl (PAZEO) 0.7 % SOLN Place 1 drop into both eyes daily.      OZEMPIC, 2 MG/DOSE, 8 MG/3ML SOPN INJECT SUBCUTANEOUSLY 2 MG EVERY WEEK 9 mL 3   rosuvastatin (CRESTOR) 20 MG tablet TAKE 1 TABLET BY MOUTH DAILY 100 tablet 2   Current  Facility-Administered Medications  Medication Dose Route Frequency Provider Last Rate Last Admin   diclofenac Sodium (VOLTAREN) 1 % topical gel 4 g  4 g Topical QID PRN Reymundo Poll, MD       Family History  Problem Relation Age of Onset   Diabetes Mother        died at age 40   Heart failure Mother    Hypertension Mother    Hypertension Brother    Social History   Socioeconomic History   Marital status: Single    Spouse name: Not on file   Number of children: Not on file   Years of education: Not on file   Highest education level: Not on file  Occupational History   Not on file  Tobacco Use   Smoking status: Never   Smokeless tobacco: Never  Vaping Use   Vaping status: Never Used  Substance and Sexual Activity   Alcohol use: Never    Alcohol/week: 0.0 standard drinks of alcohol   Drug use: Never   Sexual activity: Yes    Birth control/protection: I.U.D.    Comment: currently has IUD  Other Topics Concern   Not on file  Social History Narrative   Not on file   Social Determinants of Health   Financial Resource Strain:  Low Risk  (09/15/2022)   Overall Financial Resource Strain (CARDIA)    Difficulty of Paying Living Expenses: Not hard at all  Food Insecurity: No Food Insecurity (09/15/2022)   Hunger Vital Sign    Worried About Running Out of Food in the Last Year: Never true    Ran Out of Food in the Last Year: Never true  Transportation Needs: No Transportation Needs (09/15/2022)   PRAPARE - Administrator, Civil Service (Medical): No    Lack of Transportation (Non-Medical): No  Physical Activity: Inactive (09/15/2022)   Exercise Vital Sign    Days of Exercise per Week: 0 days    Minutes of Exercise per Session: 0 min  Stress: No Stress Concern Present (09/15/2022)   [employer] of Occupational Health - Occupational Stress Questionnaire    Feeling of Stress : Not at all  Social Connections: Moderately Integrated (09/15/2022)   Social  Connection and Isolation Panel [NHANES]    Frequency of Communication with Friends and Family: More than three times a week    Frequency of Social Gatherings with Friends and Family: More than three times a week    Attends Religious Services: 1 to 4 times per year    Active Member of [employer] or Organizations: No    Attends Banker Meetings: Never    Marital Status: Living with partner     Objective:  Physical Exam:  Vitals:   03/09/23 0944  BP: 105/64  Pulse: (!) 104  Temp: 98.3 F (36.8 C)  TempSrc: Oral  SpO2: 99%  Weight: (!) 312 lb 1.6 oz (141.6 kg)  Height: 4\' 11"  (1.499 m)    Constitutional: Obese, NAD Cardiovascular: Tachycardic but regular Pulmonary/Chest: Clear bilaterally, normal effort Extremities: Warm, no edema. Pedal pulses 2+ bilaterally.    Assessment & Plan:   Type 2 diabetes mellitus with microalbuminuria, without long-term current use of insulin (HCC) Chronic and well controlled on ozempic 2 mg weekly. Hgb A1c today is 5.6, continues to downtrend with ongoing weight loss. Down ~32 lbs since starting ozempic which is excellent. She is also exercising regularly. Foot exam today is normal. Follow up 3 months for BP. Can repeat Hgb A1c in 6 months.    Hyperlipidemia Taking crestor 20 mg daily. Rechecking lipid panel today.   Essential hypertension Well controlled on low dose lisinopril 20 mg daily. BP today is 105/64. She describes a few episodes of dizziness at home. Does check her BP occasionally and has been low. With her weight loss, we discussed stopping lisinopril to see if she still needs an antihypertensive agent. She will check her BP regularly at home and call if persistently elevated > 130 systolic. Otherwise follow up in 3 months.   Class 3 obesity (HCC) Doing great with ozempic, has lost almost 32 lbs. Starting weight was 343.7 lbs, she is 312 lbs today. Is also exercising regularly. Plan to continue with ozempic 2 mg weekly.    Healthcare maintenance Due for repeat PAP smear; she has opted to defer this until her next visit.

## 2023-03-09 NOTE — Assessment & Plan Note (Signed)
Doing great with ozempic, has lost almost 32 lbs. Starting weight was 343.7 lbs, she is 312 lbs today. Is also exercising regularly. Plan to continue with ozempic 2 mg weekly.

## 2023-04-10 ENCOUNTER — Other Ambulatory Visit: Payer: Self-pay | Admitting: Internal Medicine

## 2023-04-10 DIAGNOSIS — I10 Essential (primary) hypertension: Secondary | ICD-10-CM

## 2023-04-12 DIAGNOSIS — G4733 Obstructive sleep apnea (adult) (pediatric): Secondary | ICD-10-CM | POA: Diagnosis not present

## 2023-04-14 DIAGNOSIS — G4733 Obstructive sleep apnea (adult) (pediatric): Secondary | ICD-10-CM | POA: Diagnosis not present

## 2023-04-24 ENCOUNTER — Emergency Department (HOSPITAL_COMMUNITY)
Admission: EM | Admit: 2023-04-24 | Discharge: 2023-04-24 | Disposition: A | Discharge status: Home / Self Care | Payer: 59 | Attending: Emergency Medicine | Admitting: Emergency Medicine

## 2023-04-24 ENCOUNTER — Emergency Department (HOSPITAL_COMMUNITY): Payer: 59

## 2023-04-24 ENCOUNTER — Other Ambulatory Visit: Payer: Self-pay

## 2023-04-24 DIAGNOSIS — E119 Type 2 diabetes mellitus without complications: Secondary | ICD-10-CM | POA: Insufficient documentation

## 2023-04-24 DIAGNOSIS — R0789 Other chest pain: Secondary | ICD-10-CM | POA: Diagnosis not present

## 2023-04-24 DIAGNOSIS — J4521 Mild intermittent asthma with (acute) exacerbation: Secondary | ICD-10-CM | POA: Diagnosis not present

## 2023-04-24 DIAGNOSIS — J45901 Unspecified asthma with (acute) exacerbation: Secondary | ICD-10-CM | POA: Diagnosis not present

## 2023-04-24 DIAGNOSIS — Z1152 Encounter for screening for COVID-19: Secondary | ICD-10-CM | POA: Insufficient documentation

## 2023-04-24 DIAGNOSIS — R0602 Shortness of breath: Secondary | ICD-10-CM | POA: Diagnosis present

## 2023-04-24 DIAGNOSIS — I10 Essential (primary) hypertension: Secondary | ICD-10-CM | POA: Insufficient documentation

## 2023-04-24 DIAGNOSIS — J45909 Unspecified asthma, uncomplicated: Secondary | ICD-10-CM | POA: Insufficient documentation

## 2023-04-24 DIAGNOSIS — Z79899 Other long term (current) drug therapy: Secondary | ICD-10-CM | POA: Insufficient documentation

## 2023-04-24 LAB — CBC WITH DIFFERENTIAL/PLATELET
Abs Immature Granulocytes: 0.03 10*3/uL (ref 0.00–0.07)
Basophils Absolute: 0.1 10*3/uL (ref 0.0–0.1)
Basophils Relative: 1 %
Eosinophils Absolute: 0.3 10*3/uL (ref 0.0–0.5)
Eosinophils Relative: 3 %
HCT: 38.3 % (ref 36.0–46.0)
Hemoglobin: 12.3 g/dL (ref 12.0–15.0)
Immature Granulocytes: 0 %
Lymphocytes Relative: 29 %
Lymphs Abs: 2.9 10*3/uL (ref 0.7–4.0)
MCH: 29 pg (ref 26.0–34.0)
MCHC: 32.1 g/dL (ref 30.0–36.0)
MCV: 90.3 fL (ref 80.0–100.0)
Monocytes Absolute: 0.5 10*3/uL (ref 0.1–1.0)
Monocytes Relative: 5 %
Neutro Abs: 6.3 10*3/uL (ref 1.7–7.7)
Neutrophils Relative %: 62 %
Platelets: 524 10*3/uL — ABNORMAL HIGH (ref 150–400)
RBC: 4.24 MIL/uL (ref 3.87–5.11)
RDW: 13.9 % (ref 11.5–15.5)
WBC: 10.1 10*3/uL (ref 4.0–10.5)
nRBC: 0 % (ref 0.0–0.2)

## 2023-04-24 LAB — RESP PANEL BY RT-PCR (RSV, FLU A&B, COVID)  RVPGX2
Influenza A by PCR: NEGATIVE
Influenza B by PCR: NEGATIVE
Resp Syncytial Virus by PCR: NEGATIVE
SARS Coronavirus 2 by RT PCR: NEGATIVE

## 2023-04-24 LAB — BASIC METABOLIC PANEL
Anion gap: 10 (ref 5–15)
BUN: 9 mg/dL (ref 6–20)
CO2: 22 mmol/L (ref 22–32)
Calcium: 9 mg/dL (ref 8.9–10.3)
Chloride: 105 mmol/L (ref 98–111)
Creatinine, Ser: 0.67 mg/dL (ref 0.44–1.00)
GFR, Estimated: >60 mL/min (ref >60)
Glucose, Bld: 120 mg/dL — ABNORMAL HIGH (ref 70–99)
Potassium: 3 mmol/L — ABNORMAL LOW (ref 3.5–5.1)
Sodium: 137 mmol/L (ref 135–145)

## 2023-04-24 LAB — HCG, SERUM, QUALITATIVE: Preg, Serum: NEGATIVE

## 2023-04-24 MED ORDER — ALBUTEROL SULFATE HFA 108 (90 BASE) MCG/ACT IN AERS
1.0000 | INHALATION_SPRAY | Freq: Four times a day (QID) | RESPIRATORY_TRACT | 0 refills | Status: DC | PRN
Start: 2023-04-24 — End: 2024-05-16

## 2023-04-24 MED ORDER — PREDNISONE 20 MG PO TABS: 40.0000 mg | ORAL_TABLET | Freq: Every day | ORAL | 0 refills | Status: DC

## 2023-04-24 MED ORDER — METHYLPREDNISOLONE SODIUM SUCC 125 MG IJ SOLR
125.0000 mg | Freq: Once | INTRAMUSCULAR | Status: AC
  Administered 2023-04-24: 125 mg via INTRAVENOUS
  Filled 2023-04-24: qty 2

## 2023-04-24 MED ORDER — ALBUTEROL SULFATE (2.5 MG/3ML) 0.083% IN NEBU
10.0000 mg/h | INHALATION_SOLUTION | Freq: Once | RESPIRATORY_TRACT | Status: AC
  Administered 2023-04-24: 10 mg/h via RESPIRATORY_TRACT
  Filled 2023-04-24: qty 3

## 2023-04-24 NOTE — ED Triage Notes (Signed)
Pt reports chest tightness and shortness of breath since last night.

## 2023-04-24 NOTE — ED Provider Notes (Signed)
I provided a substantive portion of the care of this patient.  I personally made/approved the management plan for this patient and take responsibility for the patient management.    Patient presented as well chest tightness and shortness of breath.  History of asthma.  Patient treated with albuterol + Medrol feels much better.  Wheezing has greatly improved.  Will discharge home        Lorre Nick, MD 04/24/23 [id number]

## 2023-04-24 NOTE — Discharge Instructions (Addendum)
You are seen in the emergency room today for shortness of breath I have sent a medication to your pharmacy please take as prescribed. Call primary care and schedule an appointment for follow-up and return to the emergency room with any new or worsening symptoms.

## 2023-04-24 NOTE — ED Provider Notes (Signed)
[place] EMERGENCY DEPARTMENT AT [facility] Provider Note   CSN: [id number] Arrival date & time: 04/24/23  7829     History  Chief Complaint  Patient presents with   Shortness of Breath    Suzanne Lewis is a 42 y.o. female w/ pmhx asthma, HLD, HTN, rhinitis, anemia, OSA on CPAP, DM2 presenting with SOB and chest tightness that has been ongoing since last night. SOB is worse laying down. Has tried inhaler, did have mild relief.  Other than chest pain and shortness of breath patient is not reporting any symptoms.  She has not had any other recent asthma exacerbations.  Last asthma exacerbation was mild in approximately 1 year ago.  Has never had to be admitted for asthma before and never had to be intubated.  Denies any recent URI or illness.  Denies any fever, headache dizziness nausea vomiting diarrhea.   Shortness of Breath      Home Medications Prior to Admission medications   Medication Sig Start Date End Date Taking? Authorizing Provider  acetaminophen (TYLENOL) 500 MG tablet Take 1,000 mg by mouth 2 (two) times daily as needed for moderate pain or headache.    [provider]  albuterol (VENTOLIN HFA) 108 (90 Base) MCG/ACT inhaler USE 1 TO 2 INHALATIONS BY MOUTH  EVERY 4 HOURS AS NEEDED FOR  WHEEZING OR SHORTNESS OF BREATH 09/24/22   Reymundo Poll, MD  blood glucose meter kit and supplies KIT Dispense based on patient and insurance preference. Use up to four times daily as directed. (FOR ICD-9 250.00, 250.01). 04/11/20   Antony Madura, PA-C  cycloSPORINE (RESTASIS) 0.05 % ophthalmic emulsion Place 1 drop into both eyes 2 (two) times daily.    [provider]  diclofenac sodium (VOLTAREN) 1 % GEL Apply 4 g topically 4 (four) times daily as needed. 06/19/18   Reymundo Poll, MD  Olopatadine HCl (PAZEO) 0.7 % SOLN Place 1 drop into both eyes daily.     [provider]  OZEMPIC, 2 MG/DOSE, 8 MG/3ML SOPN INJECT SUBCUTANEOUSLY 2 MG EVERY  WEEK 02/04/23   Reymundo Poll, MD  rosuvastatin (CRESTOR) 20 MG tablet TAKE 1 TABLET BY MOUTH DAILY 02/16/23   Reymundo Poll, MD      Allergies    Citric acid and Codeine    Review of Systems   Review of Systems  Respiratory:  Positive for shortness of breath.     Physical Exam Updated Vital Signs BP (!) 149/113 (BP Location: Left Arm)   Pulse (!) 122   Temp 98 F (36.7 C) (Oral)   Resp 16   Ht 4\' 11"  (1.499 m)   Wt (!) 142 kg   SpO2 100%   BMI 63.23 kg/m  Physical Exam Vitals and nursing note reviewed.  Constitutional:      General: She is not in acute distress.    Appearance: She is not ill-appearing, toxic-appearing or diaphoretic.  HENT:     Head: Normocephalic and atraumatic.     Nose: No congestion or rhinorrhea.     Mouth/Throat:     Pharynx: No posterior oropharyngeal erythema.  Eyes:     General: No scleral icterus.    Conjunctiva/sclera: Conjunctivae normal.  Cardiovascular:     Rate and Rhythm: Regular rhythm. Tachycardia present.     Pulses: Normal pulses.     Heart sounds: Normal heart sounds. No murmur heard. Pulmonary:     Effort: Pulmonary effort is normal. No respiratory distress.     Breath  sounds: Normal breath sounds. No stridor. No wheezing or rales.  Abdominal:     General: Abdomen is flat. Bowel sounds are normal. There is no distension.     Palpations: Abdomen is soft.     Tenderness: There is no abdominal tenderness.  Musculoskeletal:     Cervical back: No rigidity or tenderness.     Right lower leg: No edema.     Left lower leg: No edema.  Skin:    General: Skin is warm and dry.     Capillary Refill: Capillary refill takes less than 2 seconds.     Findings: No lesion.  Neurological:     General: No focal deficit present.     Mental Status: She is alert and oriented to person, place, and time. Mental status is at baseline.     ED Results / Procedures / Treatments   Labs (all labs ordered are listed, but only abnormal  results are displayed) Labs Reviewed - No data to display  EKG None  Radiology No results found.  Procedures Procedures    Medications Ordered in ED Medications - No data to display  ED Course/ Medical Decision Making/ A&P                                 Medical Decision Making Amount and/or Complexity of Data Reviewed Labs: ordered. Radiology: ordered.  Risk Prescription drug management.   [name] 42 y.o. presented today for shortness of breath.  Working DDx that I considered at this time includes, but not limited to, asthma/COPD exacerbation, URI, viral illness, anemia, ACS, PE, pneumonia, pleural effusion, lung mass.  R/o DDx: These are considered less likely due to history of present illness, physical exam, labs/imaging findings  Pmhx: asthma  Review of prior external notes: none  Unique Tests and My Interpretation:  CBC: Unremarkable.  No white blood cell count or no anemia. BMP: K 3 -patient reports that she has been using albuterol inhaler, I believe low potassium is secondary to this EKG: sinus  CXR: unremarkable  Respiratory Panel: neg  Problem List / ED Course / Critical interventions / Medication management  Reporting to the emergency room today for shortness of breath and chest tightness that started at rest last night.  Workup was overall unremarkable, CBC within normal limits.  hCG is negative, respiratory panel is negative.  X-ray is unremarkable, no acute chest pathology is noted.  Patient had improved shortness of breath after steroids and albuterol.  Patient is hemodynamically stable here oxygens saturation normal on room air.  Tachycardia has resolved.  Dr Freida Busman also saw this patient agrees to discharge with sterioes and albuterol inhaler.  I ordered medication including continuous nebulizer and prednisone for sob  Reevaluation of the patient after these medicines showed that the patient improved Patients vitals assessed. Upon arrival  patient is hemodynamically stable.  I have reviewed the patients home medicines and have made adjustments as needed    Plan: Patient presenting to emergency room with mild acute asthma exacerbation.  Symptoms are improved with Solu-Medrol and albuterol. Stable for discharge.  Agrees and understands plan.  Will take outpatient medications as prescribed including steroids and albuterol inhaler.  Patient agrees to follow-up outpatient with primary care and will return to the emergency room with any new or worsening symptoms         Final Clinical Impression(s) / ED Diagnoses Final diagnoses:  None  Rx / DC Orders ED Discharge Orders     None         Raford Pitcher Evalee Jefferson 04/24/23 1545    Lorre Nick, MD 04/25/23 [phone]

## 2023-05-10 ENCOUNTER — Other Ambulatory Visit: Payer: Self-pay | Admitting: Internal Medicine

## 2023-05-10 DIAGNOSIS — I10 Essential (primary) hypertension: Secondary | ICD-10-CM

## 2023-05-16 ENCOUNTER — Encounter: Payer: 59 | Admitting: Student

## 2023-05-16 NOTE — Progress Notes (Deleted)
42 year old female with HTN, T2DM, OSA, asthma ED 04/24/2023-chest tightness, diagnosed with asthma exacerbation.  Given steroids 10 days prednisone 40 and albuterol inhaler

## 2023-05-19 ENCOUNTER — Other Ambulatory Visit: Payer: Self-pay | Admitting: Internal Medicine

## 2023-05-19 DIAGNOSIS — I10 Essential (primary) hypertension: Secondary | ICD-10-CM

## 2023-05-23 ENCOUNTER — Other Ambulatory Visit: Payer: Self-pay | Admitting: Internal Medicine

## 2023-05-23 DIAGNOSIS — I10 Essential (primary) hypertension: Secondary | ICD-10-CM

## 2023-05-26 ENCOUNTER — Telehealth: Payer: Self-pay

## 2023-05-26 NOTE — Telephone Encounter (Signed)
Transition Care Management Unsuccessful Follow-up Telephone Call  Date of discharge and from where:  04/24/2023 [facility]  Attempts:  1st Attempt  Reason for unsuccessful TCM follow-up call:  No answer/busy  Calypso Hagarty [facility]  [facility], [facility] Resource Care Guide Direct Dial: [phone]  Website: [email or web address]

## 2023-05-27 ENCOUNTER — Telehealth: Payer: Self-pay

## 2023-05-27 NOTE — Telephone Encounter (Signed)
Transition Care Management Follow-up Telephone Call Date of discharge and from where: 04/24/2023 [facility] How have you been since you were released from the hospital? Patient stated she is feeling much better. Any questions or concerns? No  Items Reviewed: Did the pt receive and understand the discharge instructions provided? Yes  Medications obtained and verified? Yes  Other? No  Any new allergies since your discharge? No  Dietary orders reviewed? Yes Do you have support at home? Yes   Follow up appointments reviewed:  PCP Hospital f/u appt confirmed? Yes  Scheduled to see Reymundo Poll, MD on 06/08/2023 @ [facility]. Specialist Hospital f/u appt confirmed? No  Scheduled to see  on  @ . Are transportation arrangements needed? No  If their condition worsens, is the pt aware to call PCP or go to the Emergency Dept.? Yes Was the patient provided with contact information for the PCP's office or ED? Yes Was to pt encouraged to call back with questions or concerns? Yes   [facility]  [facility] Guide Direct Dial: [phone]  Website: [email or web address]

## 2023-06-03 DIAGNOSIS — G4733 Obstructive sleep apnea (adult) (pediatric): Secondary | ICD-10-CM | POA: Diagnosis not present

## 2023-06-08 ENCOUNTER — Encounter: Payer: Self-pay | Admitting: Internal Medicine

## 2023-06-08 ENCOUNTER — Ambulatory Visit: Payer: 59 | Admitting: Internal Medicine

## 2023-06-08 VITALS — BP 115/76 | HR 112 | Temp 98.7°F | Ht 59.0 in | Wt 307.2 lb

## 2023-06-08 DIAGNOSIS — H6692 Otitis media, unspecified, left ear: Secondary | ICD-10-CM | POA: Diagnosis not present

## 2023-06-08 DIAGNOSIS — J4520 Mild intermittent asthma, uncomplicated: Secondary | ICD-10-CM

## 2023-06-08 DIAGNOSIS — Z23 Encounter for immunization: Secondary | ICD-10-CM | POA: Insufficient documentation

## 2023-06-08 DIAGNOSIS — Z794 Long term (current) use of insulin: Secondary | ICD-10-CM

## 2023-06-08 DIAGNOSIS — I10 Essential (primary) hypertension: Secondary | ICD-10-CM

## 2023-06-08 DIAGNOSIS — R809 Proteinuria, unspecified: Secondary | ICD-10-CM | POA: Diagnosis not present

## 2023-06-08 DIAGNOSIS — E119 Type 2 diabetes mellitus without complications: Secondary | ICD-10-CM

## 2023-06-08 DIAGNOSIS — H6690 Otitis media, unspecified, unspecified ear: Secondary | ICD-10-CM | POA: Insufficient documentation

## 2023-06-08 DIAGNOSIS — E1129 Type 2 diabetes mellitus with other diabetic kidney complication: Secondary | ICD-10-CM

## 2023-06-08 DIAGNOSIS — Z124 Encounter for screening for malignant neoplasm of cervix: Secondary | ICD-10-CM | POA: Insufficient documentation

## 2023-06-08 LAB — POCT GLYCOSYLATED HEMOGLOBIN (HGB A1C): Hemoglobin A1C: 5.8 % — AB (ref 4.0–5.6)

## 2023-06-08 LAB — GLUCOSE, CAPILLARY: Glucose-Capillary: 91 mg/dL (ref 70–99)

## 2023-06-08 MED ORDER — AMOXICILLIN-POT CLAVULANATE 875-125 MG PO TABS
1.0000 | ORAL_TABLET | Freq: Two times a day (BID) | ORAL | 0 refills | Status: DC
Start: 2023-06-08 — End: 2023-06-23

## 2023-06-08 NOTE — Assessment & Plan Note (Signed)
Given flu shot  today 

## 2023-06-08 NOTE — Assessment & Plan Note (Signed)
Normotensive today since stopping lisinopril at her last visit. She has lost a significant amount of weight on ozempic which I think has helped improve her BP control. Continue to monitor off treatment.

## 2023-06-08 NOTE — Assessment & Plan Note (Signed)
Recent ED visit in September for a mild exacerbation but doing well now. Usually requires no treatment; has an albuterol inhaler that she uses infrequently about 3 x a month. No wheezing or SOB today. Ok to continue PRN albuterol for now. If she develops another exacerbation, low threshold to switch her to an ICS-LABA rescue or maintenance inhaler.

## 2023-06-08 NOTE — Assessment & Plan Note (Signed)
Excellent response to ozempic 2 mg weekly for both glycemic control and weight loss. Hgb A1c today is 5.8. She continues to lose weight, 36.5 lbs total since starting ozempic. Checking urine mircoalbumin today; lisinopril was stopped at her last visit due to hypotension.    Starting weight: 343.7 Starting BMI: 69.42 Today's weight: 307.2 lbs Today's BMI: 62 Total weight loss: 36.5 lbs

## 2023-06-08 NOTE — Assessment & Plan Note (Signed)
Due for repeat PAP smear but declined today. Last pap smear in 2018 was negative for intraepithelial lesions or malignancy with negative HPV co testing. She would like to do this at her next visit in 3 months.

## 2023-06-08 NOTE — Progress Notes (Signed)
Subjective:   Patient ID: Suzanne Lewis female   DOB: 05/03/1981 42 y.o.   MRN: [id number]  HPI: [name] is a 42 y.o. female with past medical history outlined below here for follow up of her HTN and DM. For further details of today's visit, please refer to the assessment and plan below.   Past Medical History:  Diagnosis Date   Allergic rhinitis    Arthritis    "RLE; broke my leg as a small child; no OR" (03/29/2018)   Diabetes mellitus without complication (HCC)    Hyperlipidemia    Hypertension    Iron deficiency anemia    Mild intermittent asthma    Morbid obesity (HCC)    OSA on CPAP    Right shoulder strain 06/19/2018   Tinnitus 06/14/2016   Right ear, pulsatile in nature - seen by ENT 12/22/16. DDx included idiopathic, vascular variant, aneurysm, or vascular tumor. Physical exam & hearing test were normal. MRI, MRA, and MRV ordered for further evaluation were all normal.    Vitamin D deficiency    Current Outpatient Medications  Medication Sig Dispense Refill   amoxicillin-clavulanate (AUGMENTIN) 875-125 MG tablet Take 1 tablet by mouth 2 (two) times daily. 14 tablet 0   acetaminophen (TYLENOL) 500 MG tablet Take 1,000 mg by mouth 2 (two) times daily as needed for moderate pain or headache.     albuterol (VENTOLIN HFA) 108 (90 Base) MCG/ACT inhaler USE 1 TO 2 INHALATIONS BY MOUTH  EVERY 4 HOURS AS NEEDED FOR  WHEEZING OR SHORTNESS OF BREATH 108 g 2   albuterol (VENTOLIN HFA) 108 (90 Base) MCG/ACT inhaler Inhale 1-2 puffs into the lungs every 6 (six) hours as needed for wheezing or shortness of breath. 1 each 0   blood glucose meter kit and supplies KIT Dispense based on patient and insurance preference. Use up to four times daily as directed. (FOR ICD-9 250.00, 250.01). 1 each 0   cycloSPORINE (RESTASIS) 0.05 % ophthalmic emulsion Place 1 drop into both eyes 2 (two) times daily.     diclofenac sodium (VOLTAREN) 1 % GEL Apply 4 g topically 4 (four) times daily as  needed. 100 g 1   Olopatadine HCl (PAZEO) 0.7 % SOLN Place 1 drop into both eyes daily.      OZEMPIC, 2 MG/DOSE, 8 MG/3ML SOPN INJECT SUBCUTANEOUSLY 2 MG EVERY WEEK 9 mL 3   rosuvastatin (CRESTOR) 20 MG tablet TAKE 1 TABLET BY MOUTH DAILY 100 tablet 2   Current Facility-Administered Medications  Medication Dose Route Frequency Provider Last Rate Last Admin   diclofenac Sodium (VOLTAREN) 1 % topical gel 4 g  4 g Topical QID PRN Reymundo Poll, MD       Family History  Problem Relation Age of Onset   Diabetes Mother        died at age 72   Heart failure Mother    Hypertension Mother    Hypertension Brother    Social History   Socioeconomic History   Marital status: Single    Spouse name: Not on file   Number of children: Not on file   Years of education: Not on file   Highest education level: Not on file  Occupational History   Not on file  Tobacco Use   Smoking status: Never   Smokeless tobacco: Never  Vaping Use   Vaping status: Never Used  Substance and Sexual Activity   Alcohol use: Never    Alcohol/week: 0.0 standard drinks of alcohol  Drug use: Never   Sexual activity: Yes    Birth control/protection: I.U.D.    Comment: currently has IUD  Other Topics Concern   Not on file  Social History Narrative   Not on file   Social Determinants of Health   Financial Resource Strain: Low Risk  (09/15/2022)   Overall Financial Resource Strain (CARDIA)    Difficulty of Paying Living Expenses: Not hard at all  Food Insecurity: No Food Insecurity (09/15/2022)   Hunger Vital Sign    Worried About Running Out of Food in the Last Year: Never true    Ran Out of Food in the Last Year: Never true  Transportation Needs: No Transportation Needs (09/15/2022)   PRAPARE - Administrator, Civil Service (Medical): No    Lack of Transportation (Non-Medical): No  Physical Activity: Inactive (09/15/2022)   Exercise Vital Sign    Days of Exercise per Week: 0 days    Minutes  of Exercise per Session: 0 min  Stress: No Stress Concern Present (09/15/2022)   [employer] of Occupational Health - Occupational Stress Questionnaire    Feeling of Stress : Not at all  Social Connections: Moderately Integrated (09/15/2022)   Social Connection and Isolation Panel [NHANES]    Frequency of Communication with Friends and Family: More than three times a week    Frequency of Social Gatherings with Friends and Family: More than three times a week    Attends Religious Services: 1 to 4 times per year    Active Member of Clubs or Organizations: No    Attends Banker Meetings: Never    Marital Status: Living with partner     Objective:  Physical Exam:  Vitals:   06/08/23 0843  BP: 115/76  Pulse: (!) 112  Temp: 98.7 F (37.1 C)  TempSrc: Oral  SpO2: 98%  Weight: (!) 307 lb 3.2 oz (139.3 kg)  Height: 4\' 11"  (1.499 m)    Constitutional: Obese, NAD Cardiovascular: Tachycardic but regular Pulmonary/Chest: Clear bilaterally, normal effort Extremities: Warm, no edema. Pedal pulses 2+ bilaterally.   Assessment & Plan:   Type 2 diabetes mellitus with microalbuminuria, without long-term current use of insulin (HCC) Excellent response to ozempic 2 mg weekly for both glycemic control and weight loss. Hgb A1c today is 5.8. She continues to lose weight, 36.5 lbs total since starting ozempic. Checking urine mircoalbumin today; lisinopril was stopped at her last visit due to hypotension.    Starting weight: 343.7 Starting BMI: 69.42 Today's weight: 307.2 lbs Today's BMI: 62 Total weight loss: 36.5 lbs  Otitis media Patient reports frontal and maxillary sinus pain / headache x 3 days. Also ear pain on the left. Denies congestion or rhinorrhea, no fevers. On exam right ear canal and TM appear normal. Left TM is mildly erythematous and bulging, slight middle ear effusion is present. Will treat with Augmentin for otitis media and possible bacterial sinusitis. Also  recommended sinus irrigation and flonase.   Mild intermittent asthma Recent ED visit in September for a mild exacerbation but doing well now. Usually requires no treatment; has an albuterol inhaler that she uses infrequently about 3 x a month. No wheezing or SOB today. Ok to continue PRN albuterol for now. If she develops another exacerbation, low threshold to switch her to an ICS-LABA rescue or maintenance inhaler.    Essential hypertension Normotensive today since stopping lisinopril at her last visit. She has lost a significant amount of weight on ozempic which I think  has helped improve her BP control. Continue to monitor off treatment.   Needs flu shot Given flu shot today.   Cervical cancer screening Due for repeat PAP smear but declined today. Last pap smear in 2018 was negative for intraepithelial lesions or malignancy with negative HPV co testing. She would like to do this at her next visit in 3 months.

## 2023-06-08 NOTE — Assessment & Plan Note (Signed)
Patient reports frontal and maxillary sinus pain / headache x 3 days. Also ear pain on the left. Denies congestion or rhinorrhea, no fevers. On exam right ear canal and TM appear normal. Left TM is mildly erythematous and bulging, slight middle ear effusion is present. Will treat with Augmentin for otitis media and possible bacterial sinusitis. Also recommended sinus irrigation and flonase.

## 2023-06-08 NOTE — Patient Instructions (Signed)
Suzanne Lewis,  It was a pleasure to see you. Please follow up with me again in 3 months. If you have any questions or concerns, call our clinic at [phone] or after hours call [phone] and ask for the internal medicine resident on call.   Thank you!  Dr. Reece Agar

## 2023-06-09 LAB — MICROALBUMIN / CREATININE URINE RATIO
Creatinine, Urine: 215.8 mg/dL
Microalb/Creat Ratio: 23 mg/g{creat} (ref 0–29)
Microalbumin, Urine: 49.4 ug/mL

## 2023-06-11 ENCOUNTER — Other Ambulatory Visit: Payer: Self-pay | Admitting: Internal Medicine

## 2023-06-11 DIAGNOSIS — I10 Essential (primary) hypertension: Secondary | ICD-10-CM

## 2023-06-23 ENCOUNTER — Other Ambulatory Visit: Payer: Self-pay | Admitting: Internal Medicine

## 2023-06-23 ENCOUNTER — Telehealth: Payer: 59 | Admitting: Physician Assistant

## 2023-06-23 DIAGNOSIS — I10 Essential (primary) hypertension: Secondary | ICD-10-CM

## 2023-06-23 DIAGNOSIS — J4520 Mild intermittent asthma, uncomplicated: Secondary | ICD-10-CM

## 2023-06-23 DIAGNOSIS — H6691 Otitis media, unspecified, right ear: Secondary | ICD-10-CM

## 2023-06-23 MED ORDER — CEFDINIR 300 MG PO CAPS
300.0000 mg | ORAL_CAPSULE | Freq: Two times a day (BID) | ORAL | 0 refills | Status: DC
Start: 2023-06-23 — End: 2023-10-12

## 2023-06-23 NOTE — Progress Notes (Signed)
Virtual Visit Consent   Suzanne Lewis, you are scheduled for a virtual visit with a [facility] provider today. Just as with appointments in the office, your consent must be obtained to participate. Your consent will be active for this visit and any virtual visit you may have with one of our providers in the next 365 days. If you have a MyChart account, a copy of this consent can be sent to you electronically.  As this is a virtual visit, video technology does not allow for your provider to perform a traditional examination. This may limit your provider's ability to fully assess your condition. If your provider identifies any concerns that need to be evaluated in person or the need to arrange testing (such as labs, EKG, etc.), we will make arrangements to do so. Although advances in technology are sophisticated, we cannot ensure that it will always work on either your end or our end. If the connection with a video visit is poor, the visit may have to be switched to a telephone visit. With either a video or telephone visit, we are not always able to ensure that we have a secure connection.  By engaging in this virtual visit, you consent to the provision of healthcare and authorize for your insurance to be billed (if applicable) for the services provided during this visit. Depending on your insurance coverage, you may receive a charge related to this service.  I need to obtain your verbal consent now. Are you willing to proceed with your visit today? [name] has provided verbal consent on 06/23/2023 for a virtual visit (video or telephone). Gilberto Better, [place]  Date: 06/23/2023 12:37 PM  Virtual Visit via Video Note   I, Suzanne Lewis, connected with  SUNEE Lewis  ([id number], [birth year]) on 06/23/23 at 12:30 PM EST by a video-enabled telemedicine application and verified that I am speaking with the correct person using two identifiers.  Location: Patient: Virtual Visit Location Patient:  Home Provider: Virtual Visit Location Provider: Home Office   I discussed the limitations of evaluation and management by telemedicine and the availability of in person appointments. The patient expressed understanding and agreed to proceed.    History of Present Illness: Suzanne Lewis is a 42 y.o. who identifies as a female who was assigned female at birth, and is being seen today for ear pain.[name]  HPI: 42 y/o F presents via telehealth for right ear pain. Denies ear drainage, trauma, or recent air travel. She was treated for her left ear pain and sinus infection about 1-2 weeks ago and felt better, but now has right ear similar pain. Denies fever or chills.     Problems:  Patient Active Problem List   Diagnosis Date Noted   Otitis media 06/08/2023   Needs flu shot 06/08/2023   Cervical cancer screening 06/08/2023   Hypercalcemia 10/29/2021   Tachycardia 04/10/2020   Type 2 diabetes mellitus with microalbuminuria, without long-term current use of insulin (HCC) 04/10/2020   S/P T&A (status post tonsillectomy and adenoidectomy) 03/29/2018   Obstructive sleep apnea 09/11/2016   Lichen planus 11/24/2015   Essential hypertension 11/24/2015   Hyperlipidemia 02/09/2015   Class 3 obesity 02/07/2015   Mild intermittent asthma 02/07/2015   Iron deficiency anemia 02/07/2015   Healthcare maintenance 02/07/2015   Allergic rhinitis 02/07/2015    Allergies:  Allergies  Allergen Reactions   Citric Acid Hives and Itching    tongue    Codeine Swelling    Lips swelling  was an orange pill.     Medications:  Current Outpatient Medications:    cefdinir (OMNICEF) 300 MG capsule, Take 1 capsule (300 mg total) by mouth 2 (two) times daily., Disp: 14 capsule, Rfl: 0   acetaminophen (TYLENOL) 500 MG tablet, Take 1,000 mg by mouth 2 (two) times daily as needed for moderate pain or headache., Disp: , Rfl:    albuterol (VENTOLIN HFA) 108 (90 Base) MCG/ACT inhaler, USE 1 TO 2 INHALATIONS BY MOUTH  EVERY  4 HOURS AS NEEDED FOR  WHEEZING OR SHORTNESS OF BREATH, Disp: 108 g, Rfl: 2   albuterol (VENTOLIN HFA) 108 (90 Base) MCG/ACT inhaler, Inhale 1-2 puffs into the lungs every 6 (six) hours as needed for wheezing or shortness of breath., Disp: 1 each, Rfl: 0   blood glucose meter kit and supplies KIT, Dispense based on patient and insurance preference. Use up to four times daily as directed. (FOR ICD-9 250.00, 250.01)., Disp: 1 each, Rfl: 0   cycloSPORINE (RESTASIS) 0.05 % ophthalmic emulsion, Place 1 drop into both eyes 2 (two) times daily., Disp: , Rfl:    diclofenac sodium (VOLTAREN) 1 % GEL, Apply 4 g topically 4 (four) times daily as needed., Disp: 100 g, Rfl: 1   Olopatadine HCl (PAZEO) 0.7 % SOLN, Place 1 drop into both eyes daily. , Disp: , Rfl:    OZEMPIC, 2 MG/DOSE, 8 MG/3ML SOPN, INJECT SUBCUTANEOUSLY 2 MG EVERY WEEK, Disp: 9 mL, Rfl: 3   rosuvastatin (CRESTOR) 20 MG tablet, TAKE 1 TABLET BY MOUTH DAILY, Disp: 100 tablet, Rfl: 2  Current Facility-Administered Medications:    diclofenac Sodium (VOLTAREN) 1 % topical gel 4 g, 4 g, Topical, QID PRN, Reymundo Poll, MD  Observations/Objective: Patient is well-developed, well-nourished in no acute distress.  Resting comfortably  at home.  Head is normocephalic, atraumatic.  No labored breathing.  Speech is clear and coherent with logical content.  Patient is alert and oriented at baseline.    Assessment and Plan: 1. Right otitis media, unspecified otitis media type - cefdinir (OMNICEF) 300 MG capsule; Take 1 capsule (300 mg total) by mouth 2 (two) times daily.  Dispense: 14 capsule; Refill: 0  Start medicine as prescribed. May take Tylenol for pain. Apply warm moist compresses to the affected ear to soothe the pain. Continue to watch for worsening symptoms. If symptoms don't improve then follow up with face to face appointment. Pt verbalized understanding and in agreement.    Follow Up Instructions: I discussed the assessment  and treatment plan with the patient. The patient was provided an opportunity to ask questions and all were answered. The patient agreed with the plan and demonstrated an understanding of the instructions.  A copy of instructions were sent to the patient via MyChart unless otherwise noted below.   Patient has requested to receive PHI (AVS, Work Notes, etc) pertaining to this video visit through e-mail as they are currently without active MyChart. They have voiced understand that email is not considered secure and their health information could be viewed by someone other than the patient.   The patient was advised to call back or seek an in-person evaluation if the symptoms worsen or if the condition fails to improve as anticipated.    Gilberto Better, PA-C

## 2023-06-23 NOTE — Patient Instructions (Signed)
Sherryle Lis, thank you for joining Gilberto Better, PA-C for today's virtual visit.  While this provider is not your primary care provider (PCP), if your PCP is located in our provider database this encounter information will be shared with them immediately following your visit.   A [facility] MyChart account gives you access to today's visit and all your visits, tests, and labs performed at [facility] " click here if you don't have a  MyChart account or go to [email or web address]  Consent: (Patient) Suzanne Lewis provided verbal consent for this virtual visit at the beginning of the encounter.  Current Medications:  Current Outpatient Medications:    cefdinir (OMNICEF) 300 MG capsule, Take 1 capsule (300 mg total) by mouth 2 (two) times daily., Disp: 14 capsule, Rfl: 0   acetaminophen (TYLENOL) 500 MG tablet, Take 1,000 mg by mouth 2 (two) times daily as needed for moderate pain or headache., Disp: , Rfl:    albuterol (VENTOLIN HFA) 108 (90 Base) MCG/ACT inhaler, USE 1 TO 2 INHALATIONS BY MOUTH  EVERY 4 HOURS AS NEEDED FOR  WHEEZING OR SHORTNESS OF BREATH, Disp: 108 g, Rfl: 2   albuterol (VENTOLIN HFA) 108 (90 Base) MCG/ACT inhaler, Inhale 1-2 puffs into the lungs every 6 (six) hours as needed for wheezing or shortness of breath., Disp: 1 each, Rfl: 0   blood glucose meter kit and supplies KIT, Dispense based on patient and insurance preference. Use up to four times daily as directed. (FOR ICD-9 250.00, 250.01)., Disp: 1 each, Rfl: 0   cycloSPORINE (RESTASIS) 0.05 % ophthalmic emulsion, Place 1 drop into both eyes 2 (two) times daily., Disp: , Rfl:    diclofenac sodium (VOLTAREN) 1 % GEL, Apply 4 g topically 4 (four) times daily as needed., Disp: 100 g, Rfl: 1   Olopatadine HCl (PAZEO) 0.7 % SOLN, Place 1 drop into both eyes daily. , Disp: , Rfl:    OZEMPIC, 2 MG/DOSE, 8 MG/3ML SOPN, INJECT SUBCUTANEOUSLY 2 MG EVERY WEEK, Disp: 9 mL, Rfl: 3   rosuvastatin  (CRESTOR) 20 MG tablet, TAKE 1 TABLET BY MOUTH DAILY, Disp: 100 tablet, Rfl: 2  Current Facility-Administered Medications:    diclofenac Sodium (VOLTAREN) 1 % topical gel 4 g, 4 g, Topical, QID PRN, Reymundo Poll, MD   Medications ordered in this encounter:  Meds ordered this encounter  Medications   cefdinir (OMNICEF) 300 MG capsule    Sig: Take 1 capsule (300 mg total) by mouth 2 (two) times daily.    Dispense:  14 capsule    Refill:  0    Order Specific Question:   Supervising Provider    Answer:   Merrilee Jansky [id number]     *If you need refills on other medications prior to your next appointment, please contact your pharmacy*  Follow-Up: Call back or seek an in-person evaluation if the symptoms worsen or if the condition fails to improve as anticipated.  [facility] [phone]  Other Instructions Start medicine as prescribed. May take Tylenol for pain. Apply warm moist compresses to the affected ear to soothe the pain. Continue to watch for worsening symptoms. If symptoms don't improve then follow up with face to face appointment.   If you have been instructed to have an in-person evaluation today at a local Urgent Care facility, please use the link below. It will take you to a list of all of our available  Urgent Cares, including address, phone number and hours of  operation. Please do not delay care.  [facility]  If you or a family member do not have a primary care provider, use the link below to schedule a visit and establish care. When you choose a [facility] primary care physician or advanced practice provider, you gain a long-term partner in health. Find a Primary Care Provider  Learn more about 's in-office and virtual care options:  - Get Care Now

## 2023-06-25 ENCOUNTER — Other Ambulatory Visit: Payer: Self-pay

## 2023-06-25 ENCOUNTER — Encounter (HOSPITAL_COMMUNITY): Payer: Self-pay | Admitting: *Deleted

## 2023-06-25 ENCOUNTER — Emergency Department (HOSPITAL_COMMUNITY)
Admission: EM | Admit: 2023-06-25 | Discharge: 2023-06-25 | Disposition: A | Discharge status: Home / Self Care | Payer: 59 | Attending: Emergency Medicine | Admitting: Emergency Medicine

## 2023-06-25 DIAGNOSIS — Z7984 Long term (current) use of oral hypoglycemic drugs: Secondary | ICD-10-CM | POA: Insufficient documentation

## 2023-06-25 DIAGNOSIS — H6591 Unspecified nonsuppurative otitis media, right ear: Secondary | ICD-10-CM | POA: Insufficient documentation

## 2023-06-25 DIAGNOSIS — J029 Acute pharyngitis, unspecified: Secondary | ICD-10-CM | POA: Insufficient documentation

## 2023-06-25 DIAGNOSIS — E119 Type 2 diabetes mellitus without complications: Secondary | ICD-10-CM | POA: Insufficient documentation

## 2023-06-25 DIAGNOSIS — H9201 Otalgia, right ear: Secondary | ICD-10-CM | POA: Diagnosis present

## 2023-06-25 LAB — GROUP A STREP BY PCR: Group A Strep by PCR: NOT DETECTED

## 2023-06-25 MED ORDER — AMOXICILLIN-POT CLAVULANATE 875-125 MG PO TABS: 1.0000 | ORAL_TABLET | Freq: Two times a day (BID) | ORAL | 0 refills | Status: DC

## 2023-06-25 MED ORDER — NAPROXEN 375 MG PO TABS: 375.0000 mg | ORAL_TABLET | Freq: Two times a day (BID) | ORAL | 0 refills | Status: DC

## 2023-06-25 NOTE — Discharge Instructions (Signed)
Please stop taking the cefdinir medication.  Begin taking the antibiotic that I have ordered for you.  You may also take the anti-inflammatory medication, naproxen twice daily with food.  Make sure you are drinking plenty of fluid.  You may also take Tylenol for pain.  Contact a health care provider if: You have bleeding from your nose. There is a lump on your neck. You are not feeling better in 5 days. You feel worse instead of better. Get help right away if: You have severe pain that is not controlled with medicine. You have swelling, redness, or pain around your ear. You have stiffness in your neck. A part of your face is not moving (paralyzed). The bone behind your ear (mastoid bone) is tender when you touch it. You develop a severe headache.

## 2023-06-25 NOTE — ED Triage Notes (Signed)
The pt is having a sore throat  rt earache for 2-3 days she has a rx from her doctor that she reports is not helping lmp last week unknown temp

## 2023-06-25 NOTE — ED Provider Notes (Signed)
[place] EMERGENCY DEPARTMENT AT [facility] Provider Note   CSN: [id number] Arrival date & time: 06/25/23  1556     History  Chief Complaint  Patient presents with   Sore Throat    Suzanne Lewis is a 42 y.o. female who presents for evaluation of right ear pain and sore throat.  She was seen in a televisit 2 days ago and started on Omnicef which she has been taking without improvement in her pain.  She has been taking Tylenol with only minimal relief.  She denies inability to swallow.  She has a history of ear infection on the left side 2 weeks ago.  Notably patient's heart rate was 130 coming in but she states she had just taken her albuterol inhaler.  She has no respiratory complaints at this time   Sore Throat       Home Medications Prior to Admission medications   Medication Sig Start Date End Date Taking? Authorizing Provider  acetaminophen (TYLENOL) 500 MG tablet Take 1,000 mg by mouth 2 (two) times daily as needed for moderate pain or headache.    [provider]  albuterol (VENTOLIN HFA) 108 (90 Base) MCG/ACT inhaler Inhale 1-2 puffs into the lungs every 6 (six) hours as needed for wheezing or shortness of breath. 04/24/23   Barrett, Horald Chestnut, PA-C  albuterol (VENTOLIN HFA) 108 (90 Base) MCG/ACT inhaler USE 1 TO 2 INHALATIONS BY MOUTH  EVERY 4 HOURS AS NEEDED FOR  WHEEZING OR SHORTNESS OF BREATH 06/24/23   Mercie Eon, MD  blood glucose meter kit and supplies KIT Dispense based on patient and insurance preference. Use up to four times daily as directed. (FOR ICD-9 250.00, 250.01). 04/11/20   Antony Madura, PA-C  cefdinir (OMNICEF) 300 MG capsule Take 1 capsule (300 mg total) by mouth 2 (two) times daily. 06/23/23   Gilberto Better, PA-C  cycloSPORINE (RESTASIS) 0.05 % ophthalmic emulsion Place 1 drop into both eyes 2 (two) times daily.    [provider]  diclofenac sodium (VOLTAREN) 1 % GEL Apply 4 g topically 4 (four) times daily as needed.  06/19/18   Reymundo Poll, MD  Olopatadine HCl (PAZEO) 0.7 % SOLN Place 1 drop into both eyes daily.     [provider]  OZEMPIC, 2 MG/DOSE, 8 MG/3ML SOPN INJECT SUBCUTANEOUSLY 2 MG EVERY WEEK 02/04/23   Reymundo Poll, MD  rosuvastatin (CRESTOR) 20 MG tablet TAKE 1 TABLET BY MOUTH DAILY 02/16/23   Reymundo Poll, MD      Allergies    Citric acid and Codeine    Review of Systems   Review of Systems  Physical Exam Updated Vital Signs BP (!) 170/107 (BP Location: Right Arm)   Pulse (!) 128   Temp 98.3 F (36.8 C) (Oral)   Resp 20   Ht 4\' 11"  (1.499 m)   Wt (!) 139.3 kg   LMP 06/18/2023   SpO2 97%   BMI 62.03 kg/m  Physical Exam Vitals and nursing note reviewed.  Constitutional:      General: She is not in acute distress.    Appearance: She is well-developed. She is not diaphoretic.  HENT:     Head: Normocephalic and atraumatic.     Right Ear: A middle ear effusion is present. No mastoid tenderness.     Left Ear: External ear normal.  No middle ear effusion. No mastoid tenderness.     Nose: Nose normal.     Mouth/Throat:     Mouth:  Mucous membranes are moist.     Pharynx: Uvula midline. Posterior oropharyngeal erythema present. No uvula swelling.     Tonsils: No tonsillar exudate.  Eyes:     General: No scleral icterus.    Conjunctiva/sclera: Conjunctivae normal.  Cardiovascular:     Rate and Rhythm: Normal rate and regular rhythm.     Heart sounds: Normal heart sounds. No murmur heard.    No friction rub. No gallop.  Pulmonary:     Effort: Pulmonary effort is normal. No respiratory distress.     Breath sounds: Normal breath sounds.  Abdominal:     General: Bowel sounds are normal. There is no distension.     Palpations: Abdomen is soft. There is no mass.     Tenderness: There is no abdominal tenderness. There is no guarding.  Musculoskeletal:     Cervical back: Normal range of motion.  Skin:    General: Skin is warm and dry.  Neurological:      Mental Status: She is alert and oriented to person, place, and time.  Psychiatric:        Behavior: Behavior normal.     ED Results / Procedures / Treatments   Labs (all labs ordered are listed, but only abnormal results are displayed) Labs Reviewed  GROUP A STREP BY PCR    EKG None  Radiology No results found.  Procedures Procedures    Medications Ordered in ED Medications - No data to display  ED Course/ Medical Decision Making/ A&P                                 Medical Decision Making 42 year old female here with recurrent ear infection.  Suspect patient needs broader coverage.  Will increase her coverage with Augmentin from [facility].  Patient is diabetic and a steroid would be a poor choice for her at this time.  Will also provide the patient with anti-inflammatories.  Appears for discharge.  No signs of mastoiditis meningitis or deep space infection of the neck           Final Clinical Impression(s) / ED Diagnoses Final diagnoses:  Right otitis media with effusion    Rx / DC Orders ED Discharge Orders     None         Arthor Captain, PA-C 06/25/23 1653    Wynetta Fines, MD 06/25/23 [phone]

## 2023-06-28 NOTE — Telephone Encounter (Signed)
Call to patient to tell her to stop the Lisinopril.  Patient states is no longer taking and has been off of it for awhile.  Call to [facility] Delivery to take off medication list.

## 2023-07-11 DIAGNOSIS — G4733 Obstructive sleep apnea (adult) (pediatric): Secondary | ICD-10-CM | POA: Diagnosis not present

## 2023-07-12 DIAGNOSIS — G4733 Obstructive sleep apnea (adult) (pediatric): Secondary | ICD-10-CM | POA: Diagnosis not present

## 2023-07-18 ENCOUNTER — Telehealth: Payer: Self-pay | Admitting: *Deleted

## 2023-07-18 NOTE — Telephone Encounter (Signed)
Transition Care Management Unsuccessful Follow-up Telephone Call  Date of discharge and from where:  [facility] [place]  06/25/2023  Attempts:  1st Attempt  [facility] [place]  Hospital-up call:  Left voice message

## 2023-07-20 ENCOUNTER — Telehealth: Payer: Self-pay | Admitting: *Deleted

## 2023-07-20 NOTE — Progress Notes (Signed)
Transition Care Management Follow-up Telephone Call Date of discharge and from where: [facility] [place]  06/25/2023 How have you been since you were released from the hospital? Feeling  better back to normal  Any questions or concerns? No  Items Reviewed: Did the pt receive and understand the discharge instructions provided? Yes  Medications obtained and verified? Yes  Other? No  Any new allergies since your discharge? No  Dietary orders reviewed? No Do you have support at home? Yes      Follow up appointments reviewed:  PCP Hospital f/u appt confirmed? Yes   Has already followed up according to patient  Are transportation arrangements needed? No  If their condition worsens, is the pt aware to call PCP or go to the Emergency Dept.? Yes Was the patient provided with contact information for the PCP's office or ED? Yes Was to pt encouraged to call back with questions or concerns? Yes

## 2023-09-03 DIAGNOSIS — G4733 Obstructive sleep apnea (adult) (pediatric): Secondary | ICD-10-CM | POA: Diagnosis not present

## 2023-09-28 ENCOUNTER — Encounter: Payer: 59 | Admitting: Internal Medicine

## 2023-10-03 ENCOUNTER — Other Ambulatory Visit: Payer: Self-pay | Admitting: Internal Medicine

## 2023-10-03 DIAGNOSIS — E785 Hyperlipidemia, unspecified: Secondary | ICD-10-CM

## 2023-10-09 DIAGNOSIS — G4733 Obstructive sleep apnea (adult) (pediatric): Secondary | ICD-10-CM | POA: Diagnosis not present

## 2023-10-12 ENCOUNTER — Ambulatory Visit: Payer: 59 | Admitting: Internal Medicine

## 2023-10-12 ENCOUNTER — Encounter: Payer: Self-pay | Admitting: Internal Medicine

## 2023-10-12 ENCOUNTER — Other Ambulatory Visit (HOSPITAL_COMMUNITY)
Admission: RE | Admit: 2023-10-12 | Discharge: 2023-10-12 | Disposition: A | Discharge status: Home / Self Care | Source: Ambulatory Visit | Attending: Internal Medicine | Admitting: Internal Medicine

## 2023-10-12 VITALS — BP 130/88 | HR 96 | Temp 97.7°F | Ht 59.0 in | Wt 316.0 lb

## 2023-10-12 DIAGNOSIS — I10 Essential (primary) hypertension: Secondary | ICD-10-CM | POA: Diagnosis not present

## 2023-10-12 DIAGNOSIS — E1129 Type 2 diabetes mellitus with other diabetic kidney complication: Secondary | ICD-10-CM | POA: Diagnosis not present

## 2023-10-12 DIAGNOSIS — R809 Proteinuria, unspecified: Secondary | ICD-10-CM

## 2023-10-12 DIAGNOSIS — Z7985 Long-term (current) use of injectable non-insulin antidiabetic drugs: Secondary | ICD-10-CM | POA: Diagnosis not present

## 2023-10-12 DIAGNOSIS — Z01419 Encounter for gynecological examination (general) (routine) without abnormal findings: Secondary | ICD-10-CM | POA: Diagnosis present

## 2023-10-12 DIAGNOSIS — Z113 Encounter for screening for infections with a predominantly sexual mode of transmission: Secondary | ICD-10-CM | POA: Insufficient documentation

## 2023-10-12 DIAGNOSIS — G4733 Obstructive sleep apnea (adult) (pediatric): Secondary | ICD-10-CM | POA: Diagnosis not present

## 2023-10-12 DIAGNOSIS — E785 Hyperlipidemia, unspecified: Secondary | ICD-10-CM | POA: Diagnosis not present

## 2023-10-12 DIAGNOSIS — L709 Acne, unspecified: Secondary | ICD-10-CM

## 2023-10-12 DIAGNOSIS — Z1151 Encounter for screening for human papillomavirus (HPV): Secondary | ICD-10-CM | POA: Diagnosis not present

## 2023-10-12 DIAGNOSIS — J4520 Mild intermittent asthma, uncomplicated: Secondary | ICD-10-CM

## 2023-10-12 DIAGNOSIS — E7841 Elevated Lipoprotein(a): Secondary | ICD-10-CM

## 2023-10-12 DIAGNOSIS — Z124 Encounter for screening for malignant neoplasm of cervix: Secondary | ICD-10-CM

## 2023-10-12 LAB — GLUCOSE, CAPILLARY: Glucose-Capillary: 98 mg/dL (ref 70–99)

## 2023-10-12 LAB — POCT GLYCOSYLATED HEMOGLOBIN (HGB A1C): Hemoglobin A1C: 5.6 % (ref 4.0–5.6)

## 2023-10-12 MED ORDER — BENZOYL PEROXIDE-ERYTHROMYCIN 5-3 % EX GEL
Freq: Two times a day (BID) | CUTANEOUS | 1 refills | Status: DC
Start: 2023-10-12 — End: 2023-11-14

## 2023-10-12 NOTE — Patient Instructions (Signed)
 Suzanne Lewis,  It was a pleasure to see you. I will call you with the results of your PAP smear. Continue to take your  medicines as prescribed and follow up with your new PCP in 3 months. I wish you the best of luck! And congratulations on your new grandson!!  Dr. Reece Agar

## 2023-10-12 NOTE — Assessment & Plan Note (Signed)
 Weight loss has stagnated some since last visit, up a few lbs but overall has still had good response to ozempic. No side effects on the 2 mg once weekly dose. Hgb A1c is excellently controlled. Continue ozempic.   Starting weight: 343.7 Starting BMI: 69.42 Today's weight: 316 lbs Today's BMI: 63.82 Total weight loss: 27.7 lbs

## 2023-10-12 NOTE — Assessment & Plan Note (Signed)
 No symptoms; sexually active with 1 partner. Screening for STIs sent on vaginal swab.

## 2023-10-12 NOTE — Assessment & Plan Note (Signed)
 Currently diet controlled; monitoring off medications since stopping lisinopril last year. Weight loss on ozempic I'm sure is helping with this too. Checking BMP today but anticipate this will be normal.

## 2023-10-12 NOTE — Assessment & Plan Note (Signed)
 Takes crestor 20 mg for primary prevention in the setting of type II DM. Rechecking lipid panel today.

## 2023-10-12 NOTE — Assessment & Plan Note (Signed)
 On PRN albuterol; requires infrequently usually only with viral URIs or when seasonal allergies are flaring.

## 2023-10-12 NOTE — Assessment & Plan Note (Signed)
 Sent Rx for topical benzoyl peroxide- erythromycin (benzamycin) ointment BID. F/u PRN.

## 2023-10-12 NOTE — Progress Notes (Signed)
 Subjective:   Patient ID: Suzanne Lewis female   DOB: 10/18/1980 43 y.o.   MRN: [id number]  HPI: Suzanne Lewis is a 43 y.o. female with past medical history outlined below here for diabetes follow up. For further details of today's visit, please refer to the assessment and plan below.  Yesterday she became a grandmother. Her only daughter gave birth to a healthy baby boy :)    Past Medical History:  Diagnosis Date   Allergic rhinitis    Arthritis    "RLE; broke my leg as a small child; no OR" (03/29/2018)   Diabetes mellitus without complication (HCC)    Hyperlipidemia    Hypertension    Iron deficiency anemia    Mild intermittent asthma    Morbid obesity (HCC)    OSA on CPAP    Right shoulder strain 06/19/2018   Tinnitus 06/14/2016   Right ear, pulsatile in nature - seen by ENT 12/22/16. DDx included idiopathic, vascular variant, aneurysm, or vascular tumor. Physical exam & hearing test were normal. MRI, MRA, and MRV ordered for further evaluation were all normal.    Vitamin D deficiency    Current Outpatient Medications  Medication Sig Dispense Refill   benzoyl peroxide-erythromycin (BENZAMYCIN) gel Apply topically 2 (two) times daily. 23.3 g 1   acetaminophen (TYLENOL) 500 MG tablet Take 1,000 mg by mouth 2 (two) times daily as needed for moderate pain or headache.     albuterol (VENTOLIN HFA) 108 (90 Base) MCG/ACT inhaler Inhale 1-2 puffs into the lungs every 6 (six) hours as needed for wheezing or shortness of breath. 1 each 0   albuterol (VENTOLIN HFA) 108 (90 Base) MCG/ACT inhaler USE 1 TO 2 INHALATIONS BY MOUTH  EVERY 4 HOURS AS NEEDED FOR  WHEEZING OR SHORTNESS OF BREATH 108 g 2   blood glucose meter kit and supplies KIT Dispense based on patient and insurance preference. Use up to four times daily as directed. (FOR ICD-9 250.00, 250.01). 1 each 0   cycloSPORINE (RESTASIS) 0.05 % ophthalmic emulsion Place 1 drop into both eyes 2 (two) times daily.     diclofenac  sodium (VOLTAREN) 1 % GEL Apply 4 g topically 4 (four) times daily as needed. 100 g 1   Olopatadine HCl (PAZEO) 0.7 % SOLN Place 1 drop into both eyes daily.      OZEMPIC, 2 MG/DOSE, 8 MG/3ML SOPN INJECT SUBCUTANEOUSLY 2 MG EVERY WEEK 9 mL 3   rosuvastatin (CRESTOR) 20 MG tablet TAKE 1 TABLET BY MOUTH DAILY 100 tablet 2   Current Facility-Administered Medications  Medication Dose Route Frequency Provider Last Rate Last Admin   diclofenac Sodium (VOLTAREN) 1 % topical gel 4 g  4 g Topical QID PRN Reymundo Poll, MD       Family History  Problem Relation Age of Onset   Diabetes Mother        died at age 57   Heart failure Mother    Hypertension Mother    Hypertension Brother    Social History   Socioeconomic History   Marital status: Single    Spouse name: Not on file   Number of children: Not on file   Years of education: Not on file   Highest education level: Not on file  Occupational History   Not on file  Tobacco Use   Smoking status: Never   Smokeless tobacco: Never  Vaping Use   Vaping status: Never Used  Substance and Sexual Activity   Alcohol use:  Never    Alcohol/week: 0.0 standard drinks of alcohol   Drug use: Never   Sexual activity: Yes    Birth control/protection: I.U.D.    Comment: currently has IUD  Other Topics Concern   Not on file  Social History Narrative   Not on file   Social Drivers of Health   Financial Resource Strain: Low Risk  (09/15/2022)   Overall Financial Resource Strain (CARDIA)    Difficulty of Paying Living Expenses: Not hard at all  Food Insecurity: No Food Insecurity (09/15/2022)   Hunger Vital Sign    Worried About Running Out of Food in the Last Year: Never true    Ran Out of Food in the Last Year: Never true  Transportation Needs: No Transportation Needs (09/15/2022)   PRAPARE - Administrator, Civil Service (Medical): No    Lack of Transportation (Non-Medical): No  Physical Activity: Inactive (09/15/2022)    Exercise Vital Sign    Days of Exercise per Week: 0 days    Minutes of Exercise per Session: 0 min  Stress: No Stress Concern Present (09/15/2022)   [employer] of Occupational Health - Occupational Stress Questionnaire    Feeling of Stress : Not at all  Social Connections: Moderately Integrated (09/15/2022)   Social Connection and Isolation Panel [NHANES]    Frequency of Communication with Friends and Family: More than three times a week    Frequency of Social Gatherings with Friends and Family: More than three times a week    Attends Religious Services: 1 to 4 times per year    Active Member of [employer] or Organizations: No    Attends Banker Meetings: Never    Marital Status: Living with partner     Objective:  Physical Exam:  Vitals:   10/12/23 0818  BP: 130/88  Pulse: 96  Temp: 97.7 F (36.5 C)  TempSrc: Oral  SpO2: 98%  Weight: (!) 316 lb (143.3 kg)  Height: 4\' 11"  (1.499 m)    Constitutional: Obese, NAD Cardiovascular: Tachycardic but regular Pulmonary/Chest: Clear bilaterally, normal effort GU: Normal external genitalia; vaginal exam limited due to faulty light on speculum but cervix initially visualized without lesions; white / watery discharged noted in the vaginal canal.    Assessment & Plan:   Hyperlipidemia Takes crestor 20 mg for primary prevention in the setting of type II DM. Rechecking lipid panel today.   Essential hypertension Currently diet controlled; monitoring off medications since stopping lisinopril last year. Weight loss on ozempic I'm sure is helping with this too. Checking BMP today but anticipate this will be normal.   Type 2 diabetes mellitus with microalbuminuria, without long-term current use of insulin (HCC) Weight loss has stagnated some since last visit, up a few lbs but overall has still had good response to ozempic. No side effects on the 2 mg once weekly dose. Hgb A1c is excellently controlled. Continue ozempic.    Starting weight: 343.7 Starting BMI: 69.42 Today's weight: 316 lbs Today's BMI: 63.82 Total weight loss: 27.7 lbs  Mild intermittent asthma On PRN albuterol; requires infrequently usually only with viral URIs or when seasonal allergies are flaring.   Cervical cancer screening PAP smear collected today; unfortunately there was an issue with a faulty speculum equipment during examination. The light on the disposable speculum fell off after insertion and I had a difficulty time visualizing to collect cervical specimen. Hopefully sample will be adequate. Will f/u.   Screening examination for STI No symptoms; sexually  active with 1 partner. Screening for STIs sent on vaginal swab.   Adult acne Sent Rx for topical benzoyl peroxide- erythromycin (benzamycin) ointment BID. F/u PRN.

## 2023-10-12 NOTE — Assessment & Plan Note (Signed)
 PAP smear collected today; unfortunately there was an issue with a faulty speculum equipment during examination. The light on the disposable speculum fell off after insertion and I had a difficulty time visualizing to collect cervical specimen. Hopefully sample will be adequate. Will f/u.

## 2023-10-13 LAB — CERVICOVAGINAL ANCILLARY ONLY
Bacterial Vaginitis (gardnerella): NEGATIVE
Candida Glabrata: NEGATIVE
Candida Vaginitis: POSITIVE — AB
Chlamydia: NEGATIVE
Comment: NEGATIVE
Comment: NEGATIVE
Comment: NEGATIVE
Comment: NEGATIVE
Comment: NEGATIVE
Comment: NORMAL
Neisseria Gonorrhea: NEGATIVE
Trichomonas: NEGATIVE

## 2023-10-14 LAB — BMP8+ANION GAP
Anion Gap: 18 mmol/L (ref 10.0–18.0)
BUN/Creatinine Ratio: 16 (ref 9–23)
BUN: 12 mg/dL (ref 6–24)
CO2: 19 mmol/L — ABNORMAL LOW (ref 20–29)
Calcium: 9.4 mg/dL (ref 8.7–10.2)
Chloride: 102 mmol/L (ref 96–106)
Creatinine, Ser: 0.74 mg/dL (ref 0.57–1.00)
Glucose: 94 mg/dL (ref 70–99)
Potassium: 4.3 mmol/L (ref 3.5–5.2)
Sodium: 139 mmol/L (ref 134–144)
eGFR: 103 mL/min/{1.73_m2} (ref >59)

## 2023-10-14 LAB — CYTOLOGY - PAP
Adequacy: ABSENT
Comment: NEGATIVE
Diagnosis: NEGATIVE
High risk HPV: NEGATIVE

## 2023-10-17 ENCOUNTER — Encounter: Payer: Self-pay | Admitting: Internal Medicine

## 2023-11-02 ENCOUNTER — Ambulatory Visit (INDEPENDENT_AMBULATORY_CARE_PROVIDER_SITE_OTHER): Payer: 59

## 2023-11-02 VITALS — Ht 59.0 in | Wt 312.0 lb

## 2023-11-02 DIAGNOSIS — Z0000 Encounter for general adult medical examination without abnormal findings: Secondary | ICD-10-CM

## 2023-11-02 NOTE — Patient Instructions (Signed)
 Ms. Suzanne Lewis , Thank you for taking time to come for your Medicare Wellness Visit. I appreciate your ongoing commitment to your health goals. Please review the following plan we discussed and let me know if I can assist you in the future.   Referrals/Orders/Follow-Ups/Clinician Recommendations: Keep maintaining your health by keeping your appointments with Dr. Antony Contras and any specialists that you may see.  Call us if you need anything.  Have a great year!!!!  This is a list of the screening recommended for you and due dates:  Health Maintenance  Topic Date Due   Pneumococcal Vaccination (1 of 2 - PCV) Never done   Eye exam for diabetics  05/22/2021   COVID-19 Vaccine (3 - 2024-25 season) 04/03/2023   Flu Shot  03/02/2024   Complete foot exam   03/08/2024   Hemoglobin A1C  04/13/2024   Yearly kidney health urinalysis for diabetes  06/07/2024   Yearly kidney function blood test for diabetes  10/11/2024   Medicare Annual Wellness Visit  11/01/2024   DTaP/Tdap/Td vaccine (2 - Td or Tdap) 02/06/2025   Pap with HPV screening  10/11/2028   Hepatitis C Screening  Completed   HIV Screening  Completed   HPV Vaccine  Aged Out    Advanced directives: (Declined) Advance directive discussed with you today. Even though you declined this today, please call our office should you change your mind, and we can give you the proper paperwork for you to fill out.  Next Medicare Annual Wellness Visit scheduled for next year: Yes

## 2023-11-02 NOTE — Progress Notes (Signed)
 Because this visit was a virtual/telehealth visit,  certain criteria was not obtained, such a blood pressure, CBG if applicable, and timed get up and go. Any medications not marked as "taking" were not mentioned during the medication reconciliation part of the visit. Any vitals not documented were not able to be obtained due to this being a telehealth visit or patient was unable to self-report a recent blood pressure reading due to a lack of equipment at home via telehealth. Vitals that have been documented are verbally provided by the patient.   Subjective:   Suzanne Lewis is a 43 y.o. who presents for a Medicare Wellness preventive visit.  Visit Complete: Virtual I connected with  Lysa T Nourse on 11/02/23 by a audio enabled telemedicine application and verified that I am speaking with the correct person using two identifiers.  Patient Location: Home  Provider Location: Office/Clinic  I discussed the limitations of evaluation and management by telemedicine. The patient expressed understanding and agreed to proceed.  Vital Signs: Because this visit was a virtual/telehealth visit, some criteria may be missing or patient reported. Any vitals not documented were not able to be obtained and vitals that have been documented are patient reported.  VideoDeclined- This patient declined Librarian, academic. Therefore the visit was completed with audio only. Video failed and visit completed via audio.  Persons Participating in Visit: Patient.  AWV Questionnaire: No: Patient Medicare AWV questionnaire was not completed prior to this visit.  Cardiac Risk Factors include: diabetes mellitus;dyslipidemia;family history of premature cardiovascular disease;hypertension;obesity (BMI >30kg/m2);sedentary lifestyle     Objective:    Today's Vitals   11/02/23 1424  Weight: (!) 312 lb (141.5 kg)  Height: 4\' 11"  (1.499 m)  PainSc: 0-No pain   Body mass index is 63.02  kg/m.     10/12/2023    8:19 AM 06/08/2023    8:45 AM 04/24/2023    7:18 AM 03/09/2023    9:42 AM 09/15/2022   11:53 AM 09/15/2022    9:38 AM 06/16/2022    9:23 AM  Advanced Directives  Does Patient Have a Medical Advance Directive? No No No No Yes Yes No  Type of Agricultural consultant;Living will Healthcare Power of [place];Living will   Does patient want to make changes to medical advance directive?     No - Patient declined No - Patient declined   Copy of Healthcare Power of Attorney in Chart?     No - copy requested No - copy requested   Would patient like information on creating a medical advance directive? No - Patient declined No - Patient declined  No - Patient declined  No - Patient declined No - Patient declined    Current Medications (verified) Outpatient Encounter Medications as of 11/02/2023  Medication Sig   acetaminophen (TYLENOL) 500 MG tablet Take 1,000 mg by mouth 2 (two) times daily as needed for moderate pain or headache.   albuterol (VENTOLIN HFA) 108 (90 Base) MCG/ACT inhaler Inhale 1-2 puffs into the lungs every 6 (six) hours as needed for wheezing or shortness of breath.   albuterol (VENTOLIN HFA) 108 (90 Base) MCG/ACT inhaler USE 1 TO 2 INHALATIONS BY MOUTH  EVERY 4 HOURS AS NEEDED FOR  WHEEZING OR SHORTNESS OF BREATH   benzoyl peroxide-erythromycin (BENZAMYCIN) gel Apply topically 2 (two) times daily.   blood glucose meter kit and supplies KIT Dispense based on patient and insurance preference. Use up to four  times daily as directed. (FOR ICD-9 250.00, 250.01).   cycloSPORINE (RESTASIS) 0.05 % ophthalmic emulsion Place 1 drop into both eyes 2 (two) times daily.   diclofenac sodium (VOLTAREN) 1 % GEL Apply 4 g topically 4 (four) times daily as needed.   Olopatadine HCl (PAZEO) 0.7 % SOLN Place 1 drop into both eyes daily.    OZEMPIC, 2 MG/DOSE, 8 MG/3ML SOPN INJECT SUBCUTANEOUSLY 2 MG EVERY WEEK   rosuvastatin (CRESTOR) 20 MG tablet TAKE 1  TABLET BY MOUTH DAILY   Facility-Administered Encounter Medications as of 11/02/2023  Medication   diclofenac Sodium (VOLTAREN) 1 % topical gel 4 g    Allergies (verified) Citric acid and Codeine   History: Past Medical History:  Diagnosis Date   Allergic rhinitis    Arthritis    "RLE; broke my leg as a small child; no OR" (03/29/2018)   Diabetes mellitus without complication (HCC)    Hyperlipidemia    Hypertension    Iron deficiency anemia    Mild intermittent asthma    Morbid obesity (HCC)    OSA on CPAP    Right shoulder strain 06/19/2018   Tinnitus 06/14/2016   Right ear, pulsatile in nature - seen by ENT 12/22/16. DDx included idiopathic, vascular variant, aneurysm, or vascular tumor. Physical exam & hearing test were normal. MRI, MRA, and MRV ordered for further evaluation were all normal.    Vitamin D deficiency    Past Surgical History:  Procedure Laterality Date   CESAREAN SECTION  2004   INTRAUTERINE DEVICE INSERTION     TONSILLECTOMY AND ADENOIDECTOMY  03/29/2018   TONSILLECTOMY AND ADENOIDECTOMY N/A 03/29/2018   Procedure: TONSILLECTOMY AND ADENOIDECTOMY;  Surgeon: Newman Pies, MD;  Location: [facility] OR;  Service: ENT;  Laterality: N/A;   Family History  Problem Relation Age of Onset   Diabetes Mother        died at age 32   Heart failure Mother    Hypertension Mother    Hypertension Brother    Social History   Socioeconomic History   Marital status: Single    Spouse name: Not on file   Number of children: Not on file   Years of education: Not on file   Highest education level: Not on file  Occupational History   Not on file  Tobacco Use   Smoking status: Never   Smokeless tobacco: Never  Vaping Use   Vaping status: Never Used  Substance and Sexual Activity   Alcohol use: Never    Alcohol/week: 0.0 standard drinks of alcohol   Drug use: Never   Sexual activity: Yes    Birth control/protection: I.U.D.    Comment: currently has IUD  Other Topics Concern    Not on file  Social History Narrative   Not on file   Social Drivers of Health   Financial Resource Strain: Low Risk  (11/02/2023)   Overall Financial Resource Strain (CARDIA)    Difficulty of Paying Living Expenses: Not hard at all  Food Insecurity: No Food Insecurity (11/02/2023)   Hunger Vital Sign    Worried About Running Out of Food in the Last Year: Never true    Ran Out of Food in the Last Year: Never true  Transportation Needs: No Transportation Needs (11/02/2023)   PRAPARE - Administrator, Civil Service (Medical): No    Lack of Transportation (Non-Medical): No  Physical Activity: Insufficiently Active (11/02/2023)   Exercise Vital Sign    Days of Exercise per Week:  2 days    Minutes of Exercise per Session: 30 min  Stress: No Stress Concern Present (11/02/2023)   [employer] of Occupational Health - Occupational Stress Questionnaire    Feeling of Stress : Not at all  Social Connections: Moderately Integrated (11/02/2023)   Social Connection and Isolation Panel [NHANES]    Frequency of Communication with Friends and Family: More than three times a week    Frequency of Social Gatherings with Friends and Family: More than three times a week    Attends Religious Services: 1 to 4 times per year    Active Member of [employer] or Organizations: No    Attends Engineer, structural: Never    Marital Status: Living with partner    Tobacco Counseling Counseling given: Not Answered    Clinical Intake:  Pre-visit preparation completed: Yes  Pain : No/denies pain Pain Score: 0-No pain     BMI - recorded: 63.02 Nutritional Status: BMI > 30  Obese Nutritional Risks: None Diabetes: Yes CBG done?: Yes CBG resulted in Enter/ Edit results?: Yes Did pt. bring in CBG monitor from home?: No  Lab Results  Component Value Date   HGBA1C 5.6 10/12/2023   HGBA1C 5.8 (A) 06/08/2023   HGBA1C 5.6 (A) 03/09/2023     How often do you need to have someone help you  when you read instructions, pamphlets, or other written materials from your doctor or pharmacy?: 1 - Never What is the last grade level you completed in school?: HSG  Interpreter Needed?: No  Information entered by :: Ethaniel Garfield N. Tarra Pence, LPN.   Activities of Daily Living     11/02/2023    2:28 PM 06/08/2023    8:57 AM  In your present state of health, do you have any difficulty performing the following activities:  Hearing? 0 0  Vision? 0 0  Difficulty concentrating or making decisions? 0 0  Walking or climbing stairs? 0 0  Dressing or bathing? 0 0  Doing errands, shopping? 0 0  Preparing Food and eating ? N   Using the Toilet? N   In the past six months, have you accidently leaked urine? N   Do you have problems with loss of bowel control? N   Managing your Medications? N   Managing your Finances? N   Housekeeping or managing your Housekeeping? N     Patient Care Team: Reymundo Poll, MD as PCP - General Conley Rolls, [facility] [place], [place] as Referring Physician (Optometry)  Indicate any recent Medical Services you may have received from other than [facility] providers in the past year (date may be approximate).     Assessment:   This is a routine wellness examination for [facility].  Hearing/Vision screen Hearing Screening - Comments:: Denies hearing difficulties.  Vision Screening - Comments:: Wears rx glasses - up to date with routine eye exams with My China Le, OD.    Goals Addressed             This Visit's Progress    Client understands the importance of follow-up with providers by attending scheduled visits.       Weight < 300 lb (136.1 kg)   312 lb (141.5 kg)      Depression Screen     11/02/2023    2:29 PM 10/12/2023    8:27 AM 06/08/2023    8:57 AM 03/09/2023    9:42 AM 09/15/2022   11:54 AM 09/15/2022    9:39 AM 06/16/2022    9:23 AM  PHQ 2/9 Scores  PHQ - 2 Score 0 0 0 0 0 0 0  PHQ- 9 Score 0          Fall Risk     11/02/2023    2:27 PM 10/12/2023    8:27 AM 06/08/2023     8:57 AM 03/09/2023    9:42 AM 09/15/2022   11:54 AM  Fall Risk   Falls in the past year? 0 0 0 0 0  Number falls in past yr: 0 0 0 0 0  Injury with Fall? 0 0 0 0 0  Risk for fall due to : No Fall Risks No Fall Risks No Fall Risks No Fall Risks No Fall Risks  Follow up Falls prevention discussed;Falls evaluation completed  Falls evaluation completed Falls evaluation completed;Falls prevention discussed Falls evaluation completed;Falls prevention discussed    MEDICARE RISK AT HOME:  Medicare Risk at Home Any stairs in or around the home?: Yes (just outside) If so, are there any without handrails?: No Home free of loose throw rugs in walkways, pet beds, electrical cords, etc?: Yes Adequate lighting in your home to reduce risk of falls?: Yes Life alert?: No Use of a cane, walker or w/c?: No Grab bars in the bathroom?: No Shower chair or bench in shower?: No Elevated toilet seat or a handicapped toilet?: No  TIMED UP AND GO:  Was the test performed?  No  Cognitive Function: 6CIT completed    11/02/2023    2:28 PM  MMSE - Mini Mental State Exam  Not completed: Unable to complete        11/02/2023    2:28 PM 09/15/2022   11:54 AM  6CIT Screen  What Year? 0 points 0 points  What month? 0 points 0 points  What time? 0 points 0 points  Count back from 20 0 points 0 points  Months in reverse 0 points 0 points  Repeat phrase 0 points 0 points  Total Score 0 points 0 points    Immunizations Immunization History  Administered Date(s) Administered   Influenza, Seasonal, Injecte, Preservative Fre 06/08/2023   Influenza,inj,Quad PF,6+ Mos 05/05/2015, 05/10/2016, 05/03/2017, 06/19/2018, 04/09/2020, 06/08/2021, 06/07/2022   Influenza-Unspecified 03/02/2018   PFIZER(Purple Top)SARS-COV-2 Vaccination 02/23/2020, 03/15/2020   Tdap 02/07/2015    Screening Tests Health Maintenance  Topic Date Due   Pneumococcal Vaccine 83-67 Years old (1 of 2 - PCV) Never done   OPHTHALMOLOGY EXAM   05/22/2021   COVID-19 Vaccine (3 - 2024-25 season) 04/03/2023   INFLUENZA VACCINE  03/02/2024   FOOT EXAM  03/08/2024   HEMOGLOBIN A1C  04/13/2024   Diabetic kidney evaluation - Urine ACR  06/07/2024   Diabetic kidney evaluation - eGFR measurement  10/11/2024   Medicare Annual Wellness (AWV)  11/01/2024   DTaP/Tdap/Td (2 - Td or Tdap) 02/06/2025   Cervical Cancer Screening (HPV/Pap Cotest)  10/11/2028   Hepatitis C Screening  Completed   HIV Screening  Completed   HPV VACCINES  Aged Out    Health Maintenance  Health Maintenance Due  Topic Date Due   Pneumococcal Vaccine 55-12 Years old (1 of 2 - PCV) Never done   OPHTHALMOLOGY EXAM  05/22/2021   COVID-19 Vaccine (3 - 2024-25 season) 04/03/2023   Health Maintenance Items Addressed: Yes, patient is due for pneumonia, covid vaccine and a diabetic eye exam.  Additional Screening:  Vision Screening: Recommended annual ophthalmology exams for early detection of glaucoma and other disorders of the eye.  Dental Screening: Recommended annual  dental exams for proper oral hygiene  Community Resource Referral / Chronic Care Management: CRR required this visit?  No   CCM required this visit?  No     Plan:     I have personally reviewed and noted the following in the patient's chart:   Medical and social history Use of alcohol, tobacco or illicit drugs  Current medications and supplements including opioid prescriptions. Patient is not currently taking opioid prescriptions. Functional ability and status Nutritional status Physical activity Advanced directives List of other physicians Hospitalizations, surgeries, and ER visits in previous 12 months Vitals Screenings to include cognitive, depression, and falls Referrals and appointments  In addition, I have reviewed and discussed with patient certain preventive protocols, quality metrics, and best practice recommendations. A written personalized care plan for preventive  services as well as general preventive health recommendations were provided to patient.     Mickeal Needy, LPN   08/07/1094   After Visit Summary: (MyChart) Due to this being a telephonic visit, the after visit summary with patients personalized plan was offered to patient via MyChart   Notes: Please refer to Routing Comments.

## 2023-11-09 ENCOUNTER — Other Ambulatory Visit: Payer: Self-pay | Admitting: Internal Medicine

## 2023-11-09 DIAGNOSIS — E119 Type 2 diabetes mellitus without complications: Secondary | ICD-10-CM

## 2023-11-14 ENCOUNTER — Other Ambulatory Visit: Payer: Self-pay

## 2023-11-14 DIAGNOSIS — L709 Acne, unspecified: Secondary | ICD-10-CM

## 2023-11-14 IMAGING — CR DG CHEST 2V
2 series · 2 of 2 positions shown · non-contrast
Comparison: 05/13/2016

CLINICAL DATA: Shortness of breath and cough

EXAM:
CHEST - 2 VIEW

[chest lat]
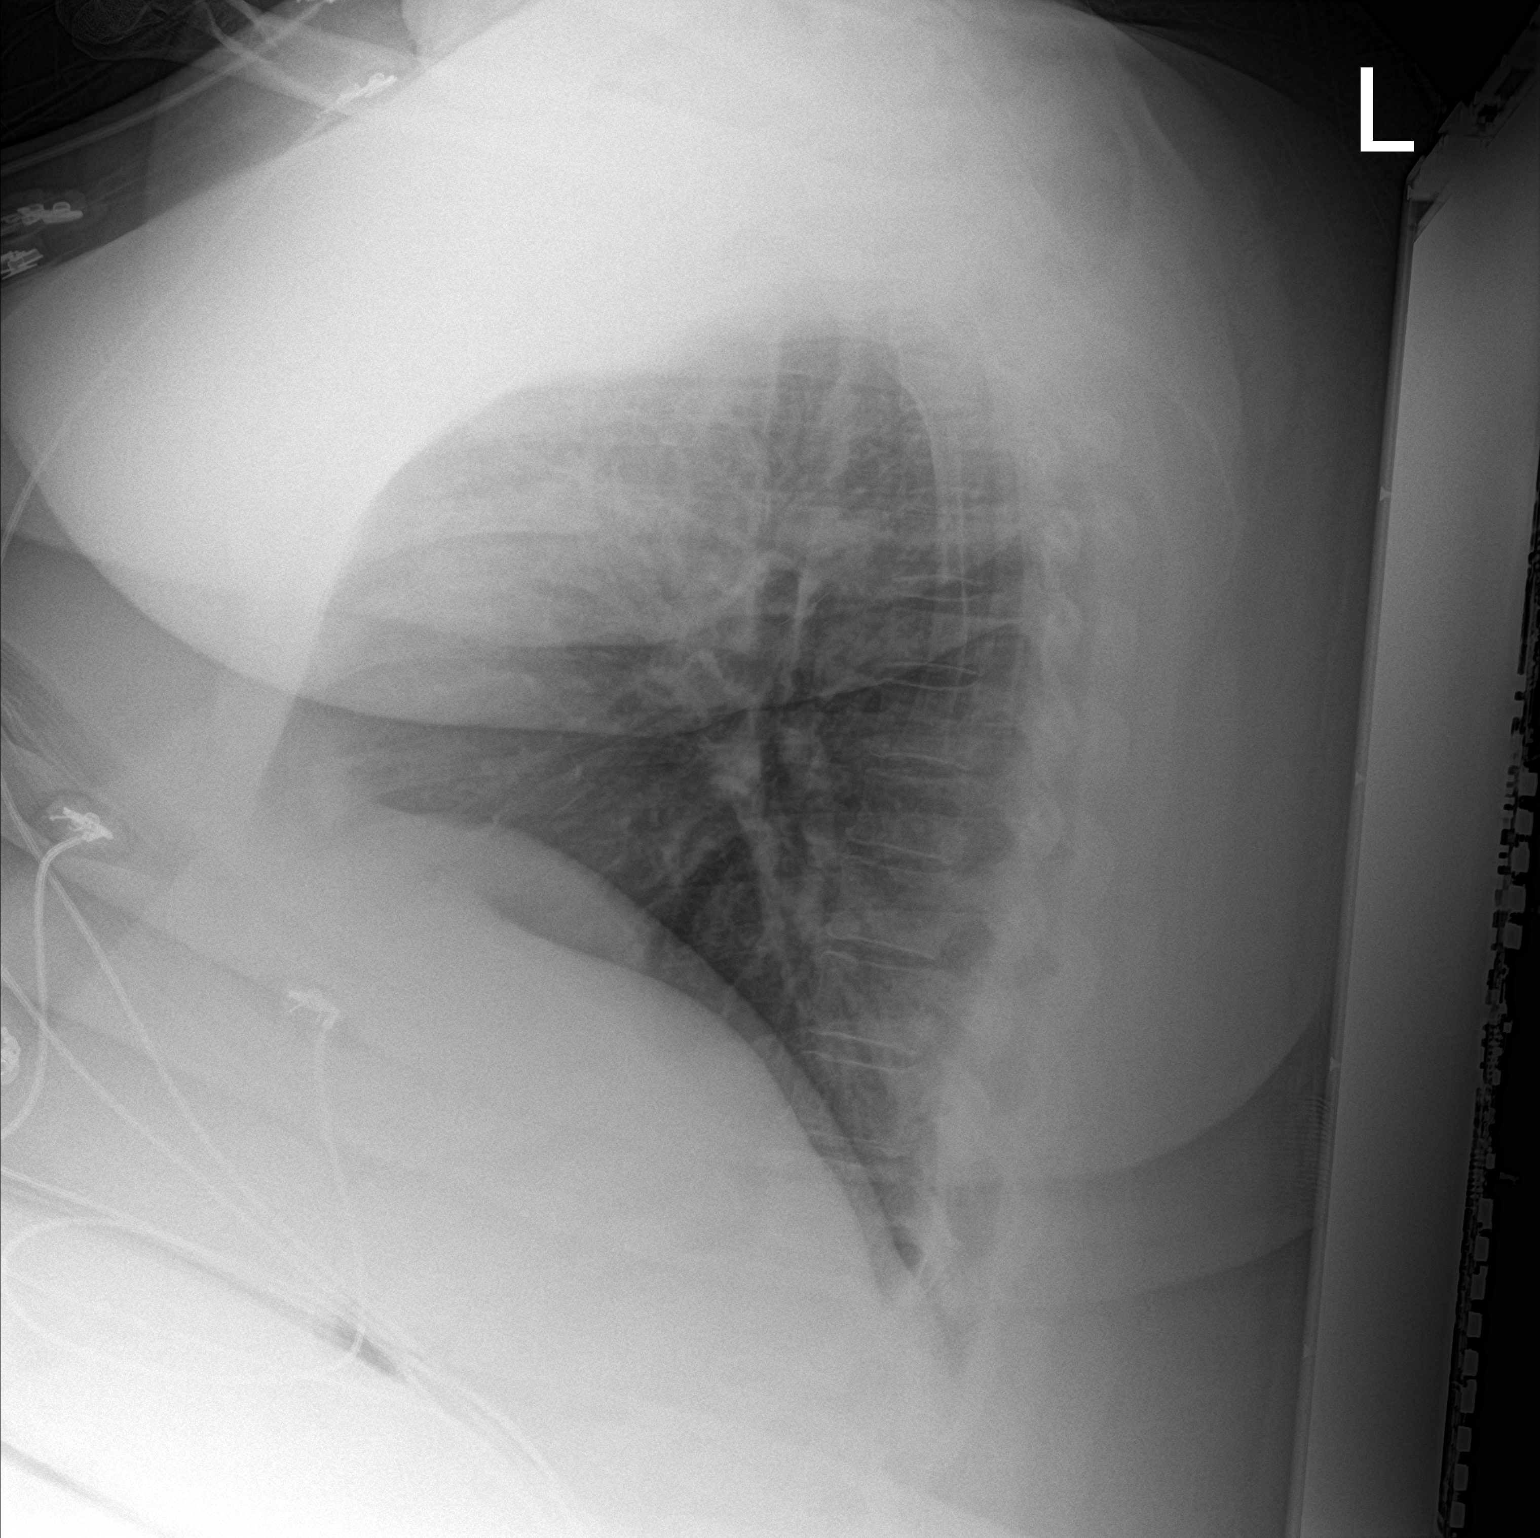

[chest ap]
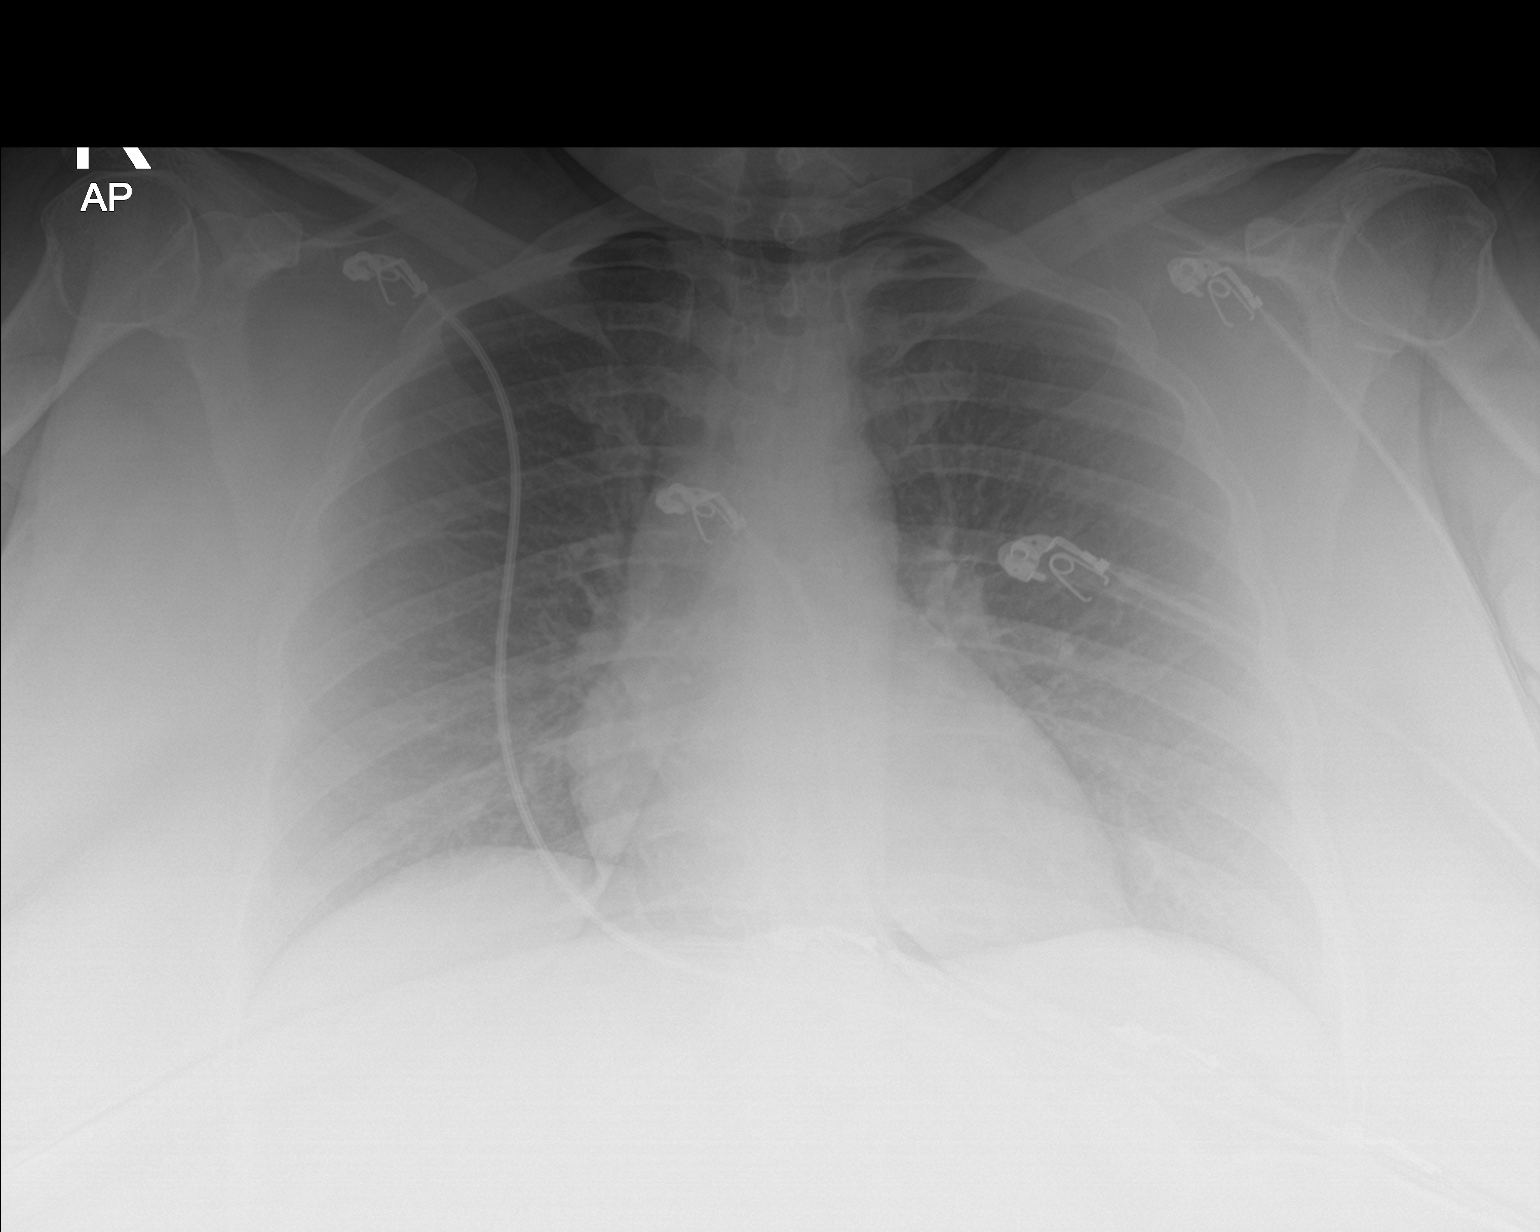

[2 of 2 positions shown; findings below may reference images not displayed]

FINDINGS: The cardiomediastinal silhouette is unremarkable.

There is no evidence of focal airspace disease, pulmonary edema,
suspicious pulmonary nodule/mass, pleural effusion, or pneumothorax.

No acute bony abnormalities are identified.
IMPRESSION: No active cardiopulmonary disease.

## 2023-11-16 MED ORDER — BENZOYL PEROXIDE-ERYTHROMYCIN 5-3 % EX GEL: Freq: Two times a day (BID) | CUTANEOUS | 1 refills | Status: DC

## 2023-12-01 DIAGNOSIS — G4733 Obstructive sleep apnea (adult) (pediatric): Secondary | ICD-10-CM | POA: Diagnosis not present

## 2024-01-12 DIAGNOSIS — G4733 Obstructive sleep apnea (adult) (pediatric): Secondary | ICD-10-CM | POA: Diagnosis not present

## 2024-05-16 ENCOUNTER — Encounter (HOSPITAL_COMMUNITY): Payer: Self-pay | Admitting: Emergency Medicine

## 2024-05-16 ENCOUNTER — Other Ambulatory Visit: Payer: Self-pay

## 2024-05-16 ENCOUNTER — Ambulatory Visit (HOSPITAL_COMMUNITY)
Admission: EM | Admit: 2024-05-16 | Discharge: 2024-05-16 | Disposition: A | Discharge status: Home / Self Care | Attending: Emergency Medicine | Admitting: Emergency Medicine

## 2024-05-16 DIAGNOSIS — J4521 Mild intermittent asthma with (acute) exacerbation: Secondary | ICD-10-CM

## 2024-05-16 MED ORDER — IPRATROPIUM-ALBUTEROL 0.5-2.5 (3) MG/3ML IN SOLN
RESPIRATORY_TRACT | Status: AC
  Filled 2024-05-16: qty 3

## 2024-05-16 MED ORDER — IPRATROPIUM-ALBUTEROL 0.5-2.5 (3) MG/3ML IN SOLN
3.0000 mL | Freq: Once | RESPIRATORY_TRACT | Status: AC
  Administered 2024-05-16: 3 mL via RESPIRATORY_TRACT

## 2024-05-16 MED ORDER — PREDNISONE 20 MG PO TABS: 40.0000 mg | ORAL_TABLET | Freq: Every day | ORAL | 0 refills | Status: AC

## 2024-05-16 MED ORDER — ALBUTEROL SULFATE HFA 108 (90 BASE) MCG/ACT IN AERS: 1.0000 | INHALATION_SPRAY | Freq: Four times a day (QID) | RESPIRATORY_TRACT | 3 refills | Status: DC | PRN

## 2024-05-16 MED ORDER — DEXAMETHASONE SOD PHOSPHATE PF 10 MG/ML IJ SOLN
10.0000 mg | Freq: Once | INTRAMUSCULAR | Status: AC
  Administered 2024-05-16: 10 mg via INTRAMUSCULAR

## 2024-05-16 NOTE — ED Triage Notes (Signed)
 Wheezing and cough for 2 days.  Also complains of right ear is popping for 2 days.    Has used alka seltzer plus for colds and using albuterol  inhaler more than usual

## 2024-05-16 NOTE — ED Provider Notes (Signed)
 [facility]    CSN: [id number] Arrival date & time: 05/16/24  1602      History   Chief Complaint Chief Complaint  Patient presents with   Wheezing   Cough    HPI Suzanne Lewis is a 43 y.o. female.   Patient presents to clinic over concern of wheezing, shortness of breath and a cough that started 2 days ago.  Does have a history of asthma and has been using her albuterol  inhaler which helps temporarily.  Is also having right ear pressure.  Has been taking Alka-Seltzer cold and flu.  Using albuterol  inhaler more than usual.  Denies fever.  Coughing.  The history is provided by the patient and medical records.  Wheezing Cough   Past Medical History:  Diagnosis Date   Allergic rhinitis    Arthritis    RLE; broke my leg as a small child; no OR (03/29/2018)   Diabetes mellitus without complication (HCC)    Hyperlipidemia    Hypertension    Iron deficiency anemia    Mild intermittent asthma    Morbid obesity (HCC)    OSA on CPAP    Right shoulder strain 06/19/2018   Tinnitus 06/14/2016   Right ear, pulsatile in nature - seen by ENT 12/22/16. DDx included idiopathic, vascular variant, aneurysm, or vascular tumor. Physical exam & hearing test were normal. MRI, MRA, and MRV ordered for further evaluation were all normal.    Vitamin D  deficiency     Patient Active Problem List   Diagnosis Date Noted   Screening examination for STI 10/12/2023   Adult acne 10/12/2023   Needs flu shot 06/08/2023   Cervical cancer screening 06/08/2023   Hypercalcemia 10/29/2021   Tachycardia 04/10/2020   Type 2 diabetes mellitus with microalbuminuria, without long-term current use of insulin  (HCC) 04/10/2020   S/P T&A (status post tonsillectomy and adenoidectomy) 03/29/2018   Obstructive sleep apnea 09/11/2016   Lichen planus 11/24/2015   Essential hypertension 11/24/2015   Hyperlipidemia 02/09/2015   Class 3 obesity (HCC) 02/07/2015   Mild intermittent asthma 02/07/2015    Iron deficiency anemia 02/07/2015   Healthcare maintenance 02/07/2015   Allergic rhinitis 02/07/2015    Past Surgical History:  Procedure Laterality Date   CESAREAN SECTION  2004   INTRAUTERINE DEVICE INSERTION     TONSILLECTOMY AND ADENOIDECTOMY  03/29/2018   TONSILLECTOMY AND ADENOIDECTOMY N/A 03/29/2018   Procedure: TONSILLECTOMY AND ADENOIDECTOMY;  Surgeon: Karis Clunes, MD;  Location: [facility] OR;  Service: ENT;  Laterality: N/A;    OB History   No obstetric history on file.      Home Medications    Prior to Admission medications   Medication Sig Start Date End Date Taking? Authorizing Provider  predniSONE  (DELTASONE ) 20 MG tablet Take 2 tablets (40 mg total) by mouth daily for 5 days. 05/16/24 05/21/24 Yes Elia Keenum  N, FNP  acetaminophen  (TYLENOL ) 500 MG tablet Take 1,000 mg by mouth 2 (two) times daily as needed for moderate pain or headache.    [provider]  albuterol  (VENTOLIN  HFA) 108 (90 Base) MCG/ACT inhaler Inhale 1-2 puffs into the lungs every 6 (six) hours as needed for wheezing or shortness of breath. 04/24/23   Barrett, Jamie N, PA-C  albuterol  (VENTOLIN  HFA) 108 (90 Base) MCG/ACT inhaler USE 1 TO 2 INHALATIONS BY MOUTH  EVERY 4 HOURS AS NEEDED FOR  WHEEZING OR SHORTNESS OF BREATH 06/24/23   Lovie Clarity, MD  benzoyl peroxide -erythromycin  [facility]) gel Apply topically 2 (two)  times daily. 11/16/23   Lovie Clarity, MD  blood glucose meter kit and supplies KIT Dispense based on patient and insurance preference. Use up to four times daily as directed. (FOR ICD-9 250.00, 250.01). 04/11/20   Keith Sor, PA-C  diclofenac  sodium (VOLTAREN ) 1 % GEL Apply 4 g topically 4 (four) times daily as needed. 06/19/18   Forest Coy, MD  OZEMPIC , 2 MG/DOSE, 8 MG/3ML SOPN INJECT SUBCUTANEOUSLY 2 MG EVERY WEEK 11/10/23   Francesco Elsie NOVAK, MD  rosuvastatin  (CRESTOR ) 20 MG tablet TAKE 1 TABLET BY MOUTH DAILY 10/10/23   Forest Coy, MD    Family History Family  History  Problem Relation Age of Onset   Diabetes Mother        died at age 65   Heart failure Mother    Hypertension Mother    Hypertension Brother     Social History Social History   Tobacco Use   Smoking status: Never   Smokeless tobacco: Never  Vaping Use   Vaping status: Never Used  Substance Use Topics   Alcohol use: Never    Alcohol/week: 0.0 standard drinks of alcohol   Drug use: Never     Allergies   Citric acid and Codeine    Review of Systems Review of Systems  Per HPI  Physical Exam Triage Vital Signs ED Triage Vitals  Encounter Vitals Group     BP 05/16/24 1705 (!) 142/84     Girls Systolic BP Percentile --      Girls Diastolic BP Percentile --      Boys Systolic BP Percentile --      Boys Diastolic BP Percentile --      Pulse Rate 05/16/24 1705 (!) 107     Resp 05/16/24 1705 (!) 26     Temp 05/16/24 1705 98.1 F (36.7 C)     Temp Source 05/16/24 1705 Oral     SpO2 05/16/24 1705 95 %     Weight --      Height --      Head Circumference --      Peak Flow --      Pain Score 05/16/24 1702 5     Pain Loc --      Pain Education --      Exclude from Growth Chart --    No data found.  Updated Vital Signs BP (!) 142/84 (BP Location: Right Arm) Comment (BP Location): regular cuff on forearm  Pulse (!) 107   Temp 98.1 F (36.7 C) (Oral)   Resp (!) 26   LMP 04/23/2024   SpO2 95%   Visual Acuity Right Eye Distance:   Left Eye Distance:   Bilateral Distance:    Right Eye Near:   Left Eye Near:    Bilateral Near:     Physical Exam Vitals and nursing note reviewed.  Constitutional:      Appearance: Normal appearance.  HENT:     Head: Normocephalic and atraumatic.     Right Ear: Tympanic membrane, ear canal and external ear normal.     Left Ear: Tympanic membrane, ear canal and external ear normal.     Nose: Nose normal.     Mouth/Throat:     Mouth: Mucous membranes are moist.  Eyes:     Conjunctiva/sclera: Conjunctivae normal.   Cardiovascular:     Rate and Rhythm: Regular rhythm. Tachycardia present.     Heart sounds: Normal heart sounds. No murmur heard. Pulmonary:     Effort: Pulmonary effort  is normal. No respiratory distress.     Breath sounds: Normal breath sounds. No wheezing.  Musculoskeletal:        General: Normal range of motion.  Skin:    General: Skin is warm and dry.  Neurological:     General: No focal deficit present.     Mental Status: She is alert and oriented to person, place, and time.  Psychiatric:        Mood and Affect: Mood normal.        Behavior: Behavior normal.      UC Treatments / Results  Labs (all labs ordered are listed, but only abnormal results are displayed) Labs Reviewed - No data to display  EKG   Radiology No results found.  Procedures Procedures (including critical care time)  Medications Ordered in UC Medications  ipratropium-albuterol  (DUONEB) 0.5-2.5 (3) MG/3ML nebulizer solution 3 mL (3 mLs Nebulization Given 05/16/24 1729)  dexamethasone  (DECADRON ) injection 10 mg (10 mg Intramuscular Given 05/16/24 1729)    Initial Impression / Assessment and Plan / UC Course  I have reviewed the triage vital signs and the nursing notes.  Pertinent labs & imaging results that were available during my care of the patient were reviewed by me and considered in my medical decision making (see chart for details).  Vitals and triage reviewed, patient is hemodynamically stable.  Lungs vesicular, heart with regular rate and rhythm.  Mildly tachycardic and tachypneic initially, does appear short of breath.  Work of breathing improved after DuoNeb.  Suspect asthma exacerbation, will treat with IM steroids and oral prednisone .  Afebrile, lower concern for bacterial etiology or pneumonia at this time, imaging deferred.  Plan of care, follow-up care and return precautions given, no questions at this time.     Final Clinical Impressions(s) / UC Diagnoses   Final  diagnoses:  Mild intermittent asthma with acute exacerbation     Discharge Instructions      Take the prednisone  tomorrow with breakfast.  You can alternate between Tylenol  and ibuprofen  every 4-6 hours for any fever, body aches or chills.  Continue to use your albuterol  inhaler every 4-6 hours as needed for wheezing or shortness of breath.  Ensure you are drinking at least 64 ounces of water daily to help loosen any secretions.  Symptoms should continue to improve with the steroids.  If no improvement or any changes seek follow-up care.    ED Prescriptions     Medication Sig Dispense Auth. Provider   predniSONE  (DELTASONE ) 20 MG tablet Take 2 tablets (40 mg total) by mouth daily for 5 days. 10 tablet Dreama, Mckaylee Dimalanta  N, FNP      PDMP not reviewed this encounter.   Dreama, Mell Mellott  N, FNP 05/16/24 1754

## 2024-05-16 NOTE — Discharge Instructions (Signed)
 Take the prednisone  tomorrow with breakfast.  You can alternate between Tylenol  and ibuprofen  every 4-6 hours for any fever, body aches or chills.  Continue to use your albuterol  inhaler every 4-6 hours as needed for wheezing or shortness of breath.  Ensure you are drinking at least 64 ounces of water daily to help loosen any secretions.  Symptoms should continue to improve with the steroids.  If no improvement or any changes seek follow-up care.

## 2024-06-18 ENCOUNTER — Ambulatory Visit (INDEPENDENT_AMBULATORY_CARE_PROVIDER_SITE_OTHER): Payer: Self-pay

## 2024-06-18 VITALS — BP 119/81 | HR 106 | Temp 98.1°F | Ht 59.0 in | Wt 318.2 lb

## 2024-06-18 DIAGNOSIS — Z23 Encounter for immunization: Secondary | ICD-10-CM

## 2024-06-18 DIAGNOSIS — J4520 Mild intermittent asthma, uncomplicated: Secondary | ICD-10-CM | POA: Diagnosis not present

## 2024-06-18 DIAGNOSIS — M25562 Pain in left knee: Secondary | ICD-10-CM | POA: Insufficient documentation

## 2024-06-18 NOTE — Assessment & Plan Note (Signed)
Flu shot given today in clinic.

## 2024-06-18 NOTE — Assessment & Plan Note (Addendum)
 Left knee pain began about a week ago. No MOI, trauma or falls noted. She did have mild swelling but that has since resolved. She denies buckling or catching of her knee. Suspect patellofemoral pain and discussed conservative treatment with her including ice/heat alternating as needed. Voltaren  gel or IcyHot. Provided patient with home exercise program for patellofemoral pain syndrome from [facility]. If her knee pain worsens or persists, she will let us  know and we can refer her to Sports Medicine colleagues at that time.  - Continue conservative management for now: Ice/heat alternating, Voltaren  gel, IcyHot, PF pain home exercises - Sports Medicine referral in the future if pain persists

## 2024-06-18 NOTE — Progress Notes (Signed)
 Patient name: Suzanne Lewis Date of birth: [birth year] Date of visit: 06/18/24  Type of visit: Established Patient Office Visit   Subjective   Chief concern:  Chief Complaint  Patient presents with   urgent care fu visit     Asthma attack, feeling better.    Knee Pain    Suzanne Lewis is a 43 y.o. female with a history of HTN, OSA, HLD, DMII, and asthma,  who presents to [facility] clinic for urgent care follow up.  She was recently seen in UC on 05/16/2024 for wheezing, shortness of breath, and cough which began on 10/13. Assessment at [facility] felt likely asthma exacerbation as her breathing improved after DuoNeb in office. She was given IM steroids and oral prednisone  for 5 days. Bacterial pneumonia was felt to be unlikely, so imaging was deferred at that time.   She presents today for follow up from UC. She reports that she feels she is back to her normal state of health. Denies worsening SOB or difficulty breathing. She reports that her symptoms resolved after her prednisone  dose. She states that she uses her inhaler about 1-2x/month when she is sick, but she denies increased use beyond this.   She also complains of left knee pain that began ~1 week ago after she woke up. She denies any known MOI, trauma or recent falls. She denies prior surgery or injury to this knee. She denies buckling or catching. She reports that there was some mild swelling of her knee but that this has resolved. She has been using Aleve , Biofreeze, and changing out ice vs. Hhat which has improved her pain.    She denies need for any refills of medications at this time.   ROS: Denies headaches, dizziness, fever, chills, runny nose, sore throat, vision changes, hearing changes, chest pain, shortness of breath, difficulty breathing, nausea, vomiting, abdominal pain. Denies increased urinary frequency, pain with urination, constipation or diarrhea. No recent falls.   Patient Active Problem List   Diagnosis Date Noted    Left anterior knee pain 06/18/2024   Screening examination for STI 10/12/2023   Adult acne 10/12/2023   Needs flu shot 06/08/2023   Cervical cancer screening 06/08/2023   Hypercalcemia 10/29/2021   Tachycardia 04/10/2020   Type 2 diabetes mellitus with microalbuminuria, without long-term current use of insulin  (HCC) 04/10/2020   S/P T&A (status post tonsillectomy and adenoidectomy) 03/29/2018   Obstructive sleep apnea 09/11/2016   Lichen planus 11/24/2015   Essential hypertension 11/24/2015   Hyperlipidemia 02/09/2015   Class 3 obesity (HCC) 02/07/2015   Mild intermittent asthma 02/07/2015   Iron deficiency anemia 02/07/2015   Healthcare maintenance 02/07/2015   Allergic rhinitis 02/07/2015     Past Surgical History:  Procedure Laterality Date   CESAREAN SECTION  2004   INTRAUTERINE DEVICE INSERTION     TONSILLECTOMY AND ADENOIDECTOMY  03/29/2018   TONSILLECTOMY AND ADENOIDECTOMY N/A 03/29/2018   Procedure: TONSILLECTOMY AND ADENOIDECTOMY;  Surgeon: Karis Clunes, MD;  Location: [facility] OR;  Service: ENT;  Laterality: N/A;     Current Outpatient Medications  Medication Instructions   acetaminophen  (TYLENOL ) 1,000 mg, Oral, 2 times daily PRN   albuterol  (VENTOLIN  HFA) 108 (90 Base) MCG/ACT inhaler USE 1 TO 2 INHALATIONS BY MOUTH  EVERY 4 HOURS AS NEEDED FOR  WHEEZING OR SHORTNESS OF BREATH   albuterol  (VENTOLIN  HFA) 108 (90 Base) MCG/ACT inhaler 1-2 puffs, Inhalation, Every 6 hours PRN   benzoyl peroxide -erythromycin  (BENZAMYCIN) gel Topical, 2 times daily  blood glucose meter kit and supplies KIT Dispense based on patient and insurance preference. Use up to four times daily as directed. (FOR ICD-9 250.00, 250.01).   diclofenac  sodium (VOLTAREN ) 4 g, Topical, 4 times daily PRN   OZEMPIC , 2 MG/DOSE, 8 MG/3ML SOPN INJECT SUBCUTANEOUSLY 2 MG EVERY WEEK   rosuvastatin  (CRESTOR ) 20 mg, Oral, Daily    Social History   Tobacco Use   Smoking status: Never   Smokeless tobacco: Never   Vaping Use   Vaping status: Never Used  Substance Use Topics   Alcohol use: Never    Alcohol/week: 0.0 standard drinks of alcohol   Drug use: Never      Objective  Today's Vitals   06/18/24 0825 06/18/24 0826  BP: 119/81   Pulse: (!) 106   Temp: 98.1 F (36.7 C)   TempSrc: Oral   SpO2: 97%   Weight: (!) 318 lb 3.2 oz (144.3 kg)   Height: 4' 11 (1.499 m)   PainSc:  8   Body mass index is 64.27 kg/m.   Physical Exam:   Constitutional: well-appearing female sitting in exam chair, in no acute distress. Ambulates without use of assistance device  HEENT: normocephalic atraumatic, mucous membranes moist Eyes: conjunctiva non-erythematous Cardiovascular: regular rate and rhythm, bilateral radial pulses 2+ Pulmonary/Chest: normal work of breathing on room air, lungs clear to auscultation bilaterally Abdominal: soft, non-tender, non-distended MSK: normal bulk and tone. Negative Homan's, no crepitus on exam, no swelling appreciated; full AROM of knee. Non-tender to palpation of posterior knee, mildly tender to palpation of anterior knee Neurological: alert & oriented x 3 Skin: warm and dry Psych: mood calm, behavior normal, thought content normal, judgement normal      The 10-year ASCVD risk score (Arnett DK, et al., 2019) is: 1.2%   Values used to calculate the score:     Age: 68 years     Clincally relevant sex: Female     Is Non-Hispanic African American: Yes     Diabetic: Yes     Tobacco smoker: No     Systolic Blood Pressure: 119 mmHg     Is BP treated: No     HDL Cholesterol: 50 mg/dL     Total Cholesterol: 133 mg/dL      Assessment & Plan  Problem List Items Addressed This Visit       Respiratory   Mild intermittent asthma - Primary   Patient currently using Albuterol  inhaler as needed for wheezing and SOB. She reports that she uses this about 1-2x/month and has not noted an increase in the need for this. She denies any further asthma exacerbations. She  reports she is back to her normal state of health. At this time, she does not meet the criteria for increasing to maintenance inhaler with ICS-SABA, though if her symptoms persist, she begins to use her inhaler more frequently, or she has further asthma exacerbations, should have low threshold to initiate maintenance inhaler. She denies the need for a refill at this time.  - Continue Albuterol  inhaler 1-2 puffs every 6 hours as needed        Other   Needs flu shot   Flu shot given today in clinic.       Left anterior knee pain   Left knee pain began about a week ago. No MOI, trauma or falls noted. She did have mild swelling but that has since resolved. She denies buckling or catching of her knee. Suspect patellofemoral pain and discussed conservative  treatment with her including ice/heat alternating as needed. Voltaren  gel or IcyHot. Provided patient with home exercise program for patellofemoral pain syndrome from [facility] Ad[facility]. If her knee pain worsens or persists, she will let us  know and we can refer her to Sports Medicine colleagues at that time.  - Continue conservative management for now: Ice/heat alternating, Voltaren  gel, IcyHot, PF pain home exercises - Sports Medicine referral in the future if pain persists       Other Visit Diagnoses       Encounter for immunization       Relevant Orders   Flu vaccine trivalent PF, 6mos and older(Flulaval,Afluria,Fluarix,Fluzone) (Completed)       Return in about 6 months (around 12/07/2024) for Annual , A1C recheck.  Patient discussed with Dr. Trudy.  Doyal Miyamoto, MD [facility]  PGY-1 06/18/2024, 3:09 PM

## 2024-06-18 NOTE — Assessment & Plan Note (Addendum)
 Patient currently using Albuterol  inhaler as needed for wheezing and SOB. She reports that she uses this about 1-2x/month and has not noted an increase in the need for this. She denies any further asthma exacerbations. She reports she is back to her normal state of health. At this time, she does not meet the criteria for increasing to maintenance inhaler with ICS-SABA, though if her symptoms persist, she begins to use her inhaler more frequently, or she has further asthma exacerbations, should have low threshold to initiate maintenance inhaler. She denies the need for a refill at this time.  - Continue Albuterol  inhaler 1-2 puffs every 6 hours as needed

## 2024-06-18 NOTE — Patient Instructions (Signed)
 Thank you, [name] for allowing us  to provide your care today. Today we discussed the following:  - mild intermittent asthma: So glad you are feeling better! If your asthma symptoms worsen, and you find that you are needing to use your inhaler more frequently, please let us  know and we would like to see you in the clinic sooner for a visit.  - Left knee pain: it sounds like patello-femoral pain that is improving with the conservative treatment you've been doing. Continue to use Naproxen  and Tylenol  Arthritis for the next 1-2 weeks. Use ice and heat intermittently. You may also use Biofreeze or Voltaren  gel over the area. If your knee pain worsens or does not improve, we can send you to our Sports Medicine physicians     Follow up: in March 2026 for your annual physical     Remember: Please let us  know if you have another asthma exacerbation or if you are using your inhaler more frequently!   Should you have any questions or concerns please call the Internal Medicine Clinic at [phone].     Doyal Miyamoto, MD [facility]

## 2024-07-08 ENCOUNTER — Emergency Department (HOSPITAL_COMMUNITY)

## 2024-07-08 ENCOUNTER — Encounter (HOSPITAL_COMMUNITY): Payer: Self-pay | Admitting: *Deleted

## 2024-07-08 ENCOUNTER — Other Ambulatory Visit: Payer: Self-pay

## 2024-07-08 ENCOUNTER — Emergency Department (HOSPITAL_COMMUNITY)
Admission: EM | Admit: 2024-07-08 | Discharge: 2024-07-08 | Disposition: A | Discharge status: Home / Self Care | Attending: Emergency Medicine | Admitting: Emergency Medicine

## 2024-07-08 DIAGNOSIS — R059 Cough, unspecified: Secondary | ICD-10-CM | POA: Diagnosis not present

## 2024-07-08 DIAGNOSIS — R1110 Vomiting, unspecified: Secondary | ICD-10-CM | POA: Diagnosis not present

## 2024-07-08 DIAGNOSIS — E876 Hypokalemia: Secondary | ICD-10-CM | POA: Diagnosis not present

## 2024-07-08 DIAGNOSIS — J45909 Unspecified asthma, uncomplicated: Secondary | ICD-10-CM | POA: Diagnosis not present

## 2024-07-08 DIAGNOSIS — R519 Headache, unspecified: Secondary | ICD-10-CM

## 2024-07-08 DIAGNOSIS — I10 Essential (primary) hypertension: Secondary | ICD-10-CM | POA: Diagnosis not present

## 2024-07-08 DIAGNOSIS — R000 Tachycardia, unspecified: Secondary | ICD-10-CM | POA: Diagnosis not present

## 2024-07-08 LAB — CBC WITH DIFFERENTIAL/PLATELET
Abs Immature Granulocytes: 0.07 K/uL (ref 0.00–0.07)
Basophils Absolute: 0.1 K/uL (ref 0.0–0.1)
Basophils Relative: 0 %
Eosinophils Absolute: 0 K/uL (ref 0.0–0.5)
Eosinophils Relative: 0 %
HCT: 41 % (ref 36.0–46.0)
Hemoglobin: 13.3 g/dL (ref 12.0–15.0)
Immature Granulocytes: 1 %
Lymphocytes Relative: 7 %
Lymphs Abs: 1 K/uL (ref 0.7–4.0)
MCH: 27.9 pg (ref 26.0–34.0)
MCHC: 32.4 g/dL (ref 30.0–36.0)
MCV: 86.1 fL (ref 80.0–100.0)
Monocytes Absolute: 0.5 K/uL (ref 0.1–1.0)
Monocytes Relative: 3 %
Neutro Abs: 13 K/uL — ABNORMAL HIGH (ref 1.7–7.7)
Neutrophils Relative %: 89 %
Platelets: 499 K/uL — ABNORMAL HIGH (ref 150–400)
RBC: 4.76 MIL/uL (ref 3.87–5.11)
RDW: 13.8 % (ref 11.5–15.5)
WBC: 14.6 K/uL — ABNORMAL HIGH (ref 4.0–10.5)
nRBC: 0 % (ref 0.0–0.2)

## 2024-07-08 LAB — URINALYSIS, ROUTINE W REFLEX MICROSCOPIC
Bilirubin Urine: NEGATIVE
Glucose, UA: 50 mg/dL — AB
Hgb urine dipstick: NEGATIVE
Ketones, ur: NEGATIVE mg/dL
Leukocytes,Ua: NEGATIVE
Nitrite: NEGATIVE
Protein, ur: NEGATIVE mg/dL
Specific Gravity, Urine: 1.008 (ref 1.005–1.030)
pH: 5 (ref 5.0–8.0)

## 2024-07-08 LAB — RESP PANEL BY RT-PCR (RSV, FLU A&B, COVID)  RVPGX2
Influenza A by PCR: NEGATIVE
Influenza B by PCR: NEGATIVE
Resp Syncytial Virus by PCR: NEGATIVE
SARS Coronavirus 2 by RT PCR: NEGATIVE

## 2024-07-08 LAB — COMPREHENSIVE METABOLIC PANEL WITH GFR
ALT: 25 U/L (ref 0–44)
AST: 27 U/L (ref 15–41)
Albumin: 3.6 g/dL (ref 3.5–5.0)
Alkaline Phosphatase: 72 U/L (ref 38–126)
Anion gap: 11 (ref 5–15)
BUN: 11 mg/dL (ref 6–20)
CO2: 21 mmol/L — ABNORMAL LOW (ref 22–32)
Calcium: 8.7 mg/dL — ABNORMAL LOW (ref 8.9–10.3)
Chloride: 103 mmol/L (ref 98–111)
Creatinine, Ser: 0.71 mg/dL (ref 0.44–1.00)
GFR, Estimated: >60 mL/min (ref >60)
Glucose, Bld: 111 mg/dL — ABNORMAL HIGH (ref 70–99)
Potassium: 3.2 mmol/L — ABNORMAL LOW (ref 3.5–5.1)
Sodium: 135 mmol/L (ref 135–145)
Total Bilirubin: 1.1 mg/dL (ref 0.0–1.2)
Total Protein: 7.4 g/dL (ref 6.5–8.1)

## 2024-07-08 LAB — I-STAT CHEM 8, ED
BUN: 11 mg/dL (ref 6–20)
Calcium, Ion: 1.08 mmol/L — ABNORMAL LOW (ref 1.15–1.40)
Chloride: 103 mmol/L (ref 98–111)
Creatinine, Ser: 0.8 mg/dL (ref 0.44–1.00)
Glucose, Bld: 106 mg/dL — ABNORMAL HIGH (ref 70–99)
HCT: 40 % (ref 36.0–46.0)
Hemoglobin: 13.6 g/dL (ref 12.0–15.0)
Potassium: 3.4 mmol/L — ABNORMAL LOW (ref 3.5–5.1)
Sodium: 137 mmol/L (ref 135–145)
TCO2: 19 mmol/L — ABNORMAL LOW (ref 22–32)

## 2024-07-08 LAB — I-STAT CG4 LACTIC ACID, ED: Lactic Acid, Venous: 1.6 mmol/L (ref 0.5–1.9)

## 2024-07-08 LAB — HCG, SERUM, QUALITATIVE: Preg, Serum: NEGATIVE

## 2024-07-08 MED ORDER — POTASSIUM CHLORIDE CRYS ER 20 MEQ PO TBCR
40.0000 meq | EXTENDED_RELEASE_TABLET | Freq: Once | ORAL | Status: AC
  Administered 2024-07-08: 40 meq via ORAL
  Filled 2024-07-08: qty 2

## 2024-07-08 MED ORDER — DIPHENHYDRAMINE HCL 25 MG PO CAPS
25.0000 mg | ORAL_CAPSULE | Freq: Once | ORAL | Status: AC
  Administered 2024-07-08: 25 mg via ORAL
  Filled 2024-07-08: qty 1

## 2024-07-08 MED ORDER — ACETAMINOPHEN 500 MG PO TABS
1000.0000 mg | ORAL_TABLET | ORAL | Status: AC
  Administered 2024-07-08: 1000 mg via ORAL
  Filled 2024-07-08: qty 2

## 2024-07-08 MED ORDER — SODIUM CHLORIDE 0.9 % IV BOLUS
1000.0000 mL | Freq: Once | INTRAVENOUS | Status: AC
  Administered 2024-07-08: 1000 mL via INTRAVENOUS

## 2024-07-08 MED ORDER — METOCLOPRAMIDE HCL 5 MG/ML IJ SOLN
10.0000 mg | Freq: Once | INTRAMUSCULAR | Status: AC
  Administered 2024-07-08: 10 mg via INTRAVENOUS
  Filled 2024-07-08: qty 2

## 2024-07-08 MED ORDER — MAGNESIUM SULFATE 2 GM/50ML IV SOLN
2.0000 g | Freq: Once | INTRAVENOUS | Status: AC
  Administered 2024-07-08: 2 g via INTRAVENOUS
  Filled 2024-07-08: qty 50

## 2024-07-08 MED ORDER — KETOROLAC TROMETHAMINE 15 MG/ML IJ SOLN
15.0000 mg | Freq: Once | INTRAMUSCULAR | Status: AC
  Administered 2024-07-08: 15 mg via INTRAVENOUS
  Filled 2024-07-08: qty 1

## 2024-07-08 NOTE — Discharge Instructions (Signed)
 You were seen today for acute headache.  Would recommend you continue to follow-up with your PCP for repeat lab recheck due to your mildly elevated white blood cell count today.  Additionally if you have recurrent headaches, they will be able to help facilitate long-term help with managing these.  If you have any recurrence or increase in your severity of headaches or have any other new or worsening symptoms including visual changes, fever, chest pain, shortness of breath, return to the ER for further evaluation.

## 2024-07-08 NOTE — ED Provider Notes (Cosign Needed Addendum)
 Suzanne Lewis   CSN: [id number] Arrival date & time: 07/08/24  1129     Patient presents with: Headache and Emesis   Suzanne Lewis is a 43 y.o. female.    Headache Associated symptoms: vomiting   Emesis Associated symptoms: headaches    Patient is a 43 year old female history of asthma obesity hyperlipidemia hypertension and OSA requiring CPAP  She presents emergency room today with complaints of generalized headache she states that is the worst headache that she has had.  She states that it is worse behind both eyes she denies any blurry vision double vision chest pain or difficulty breathing no arm or leg weakness has not had not had any facial droop or slurred speech.  She does not frequently suffer from headaches.  She denies any neck pain and is moving her head around without difficulty.  No head trauma no history of anticoagulation.     Prior to Admission medications   Medication Sig Start Date End Date Taking? Authorizing Provider  acetaminophen  (TYLENOL ) 500 MG tablet Take 1,000 mg by mouth 2 (two) times daily as needed for moderate pain or headache.   Yes [provider]  albuterol  (VENTOLIN  HFA) 108 (90 Base) MCG/ACT inhaler Inhale 1-2 puffs into the lungs every 6 (six) hours as needed for wheezing or shortness of breath. 05/16/24  Yes Zheng, Michael, DO  benzoyl peroxide -erythromycin  (BENZAMYCIN) gel Apply topically 2 (two) times daily. 11/16/23  Yes Lovie Clarity, MD  diclofenac  sodium (VOLTAREN ) 1 % GEL Apply 4 g topically 4 (four) times daily as needed. 06/19/18  Yes Forest Coy, MD  OZEMPIC , 2 MG/DOSE, 8 MG/3ML SOPN INJECT SUBCUTANEOUSLY 2 MG EVERY WEEK 11/10/23  Yes Winfrey, Elsie NOVAK, MD  rosuvastatin  (CRESTOR ) 20 MG tablet TAKE 1 TABLET BY MOUTH DAILY 10/10/23  Yes Guilloud, Carolyn, MD    Allergies: Citric acid and Codeine     Review of Systems  Gastrointestinal:  Positive for  vomiting.  Neurological:  Positive for headaches.    Updated Vital Signs BP 119/82 (BP Location: Right Wrist)   Pulse (!) 138   Temp 100.2 F (37.9 C) (Rectal)   Resp 20   Ht 4' 11 (1.499 m)   Wt 136.1 kg   SpO2 96%   BMI 60.59 kg/m   Physical Exam Vitals and nursing Lewis reviewed.  Constitutional:      General: She is not in acute distress. HENT:     Head: Normocephalic and atraumatic.     Nose: Nose normal.     Mouth/Throat:     Mouth: Mucous membranes are dry.  Eyes:     General: No scleral icterus. Cardiovascular:     Rate and Rhythm: Normal rate and regular rhythm.     Pulses: Normal pulses.     Heart sounds: Normal heart sounds.  Pulmonary:     Effort: Pulmonary effort is normal. No respiratory distress.     Breath sounds: No wheezing.  Abdominal:     Palpations: Abdomen is soft.     Tenderness: There is no abdominal tenderness.  Musculoskeletal:     Cervical back: Normal range of motion.     Right lower leg: No edema.     Left lower leg: No edema.  Skin:    General: Skin is warm and dry.     Capillary Refill: Capillary refill takes less than 2 seconds.  Neurological:     Mental Status: She is alert. Mental status is at  baseline.     Comments: Moves all 4 extremities, sensation normal in all 4 extremities, alert and oriented able to answer questions appropriately follow commands  Smile very slightly asymmetric however per fianc this is normal  Psychiatric:        Mood and Affect: Mood normal.        Behavior: Behavior normal.     (all labs ordered are listed, but only abnormal results are displayed) Labs Reviewed  CBC WITH DIFFERENTIAL/PLATELET - Abnormal; Notable for the following components:      Result Value   WBC 14.6 (*)    Platelets 499 (*)    Neutro Abs 13.0 (*)    All other components within normal limits  COMPREHENSIVE METABOLIC PANEL WITH GFR - Abnormal; Notable for the following components:   Potassium 3.2 (*)    CO2 21 (*)     Glucose, Bld 111 (*)    Calcium  8.7 (*)    All other components within normal limits  URINALYSIS, ROUTINE W REFLEX MICROSCOPIC - Abnormal; Notable for the following components:   Color, Urine STRAW (*)    Glucose, UA 50 (*)    All other components within normal limits  I-STAT CHEM 8, ED - Abnormal; Notable for the following components:   Potassium 3.4 (*)    Glucose, Bld 106 (*)    Calcium , Ion 1.08 (*)    TCO2 19 (*)    All other components within normal limits  RESP PANEL BY RT-PCR (RSV, FLU A&B, COVID)  [id number]  HCG, SERUM, QUALITATIVE  I-STAT CG4 LACTIC ACID, ED  I-STAT CG4 LACTIC ACID, ED    EKG: EKG Interpretation Date/Time:  Sunday July 08 2024 12:20:16 EST Ventricular Rate:  133 PR Interval:  146 QRS Duration:  68 QT Interval:  298 QTC Calculation: 443 R Axis:   17  Text Interpretation: Sinus tachycardia Cannot rule out Anterior infarct , age undetermined Abnormal ECG When compared with ECG of 24-Apr-2023 07:19, PREVIOUS ECG IS PRESENT when compared to prior,  faster rate No STEMI Confirmed by Ginger Barefoot ([id number]) on 07/08/2024 3:51:29 PM  Radiology: DG Chest Portable 1 View Result Date: 07/08/2024 EXAM: 1 VIEW(S) XRAY OF THE CHEST 07/08/2024 03:20:00 PM COMPARISON: 04/24/2023 CLINICAL HISTORY: sepsis FINDINGS: LUNGS AND PLEURA: No focal pulmonary opacity. No pleural effusion. No pneumothorax. HEART AND MEDIASTINUM: No acute abnormality of the cardiac and mediastinal silhouettes. BONES AND SOFT TISSUES: No acute osseous abnormality. IMPRESSION: 1. No acute cardiopulmonary process. Electronically signed by: Lynwood Seip MD 07/08/2024 03:37 PM EST RP Workstation: [id number]   CT Head Wo Contrast Result Date: 07/08/2024 EXAM: CT HEAD WITHOUT CONTRAST 07/08/2024 01:19:07 PM TECHNIQUE: CT of the head was performed without the administration of intravenous contrast. Automated exposure control, iterative reconstruction, and/or weight based adjustment of the mA/kV was utilized  to reduce the radiation dose to as low as reasonably achievable. COMPARISON: None available. CLINICAL HISTORY: Headache, sudden, severe FINDINGS: BRAIN AND VENTRICLES: No acute hemorrhage. No evidence of acute infarct. No hydrocephalus. No extra-axial collection. No mass effect or midline shift. Mild prominence of the cavum vali interpositi is noted. ORBITS: No acute abnormality. SINUSES: No acute abnormality. SOFT TISSUES AND SKULL: No acute soft tissue abnormality. No skull fracture. IMPRESSION: 1. No acute intracranial abnormality. Electronically signed by: Lonni Necessary MD 07/08/2024 01:25 PM EST RP Workstation: [id number]     Procedures   Medications Ordered in the ED  metoCLOPramide  (REGLAN ) injection 10 mg (10 mg Intravenous Given 07/08/24 1431)  diphenhydrAMINE  (BENADRYL ) capsule  25 mg (25 mg Oral Given 07/08/24 1431)  acetaminophen  (TYLENOL ) tablet 1,000 mg (1,000 mg Oral Given 07/08/24 1431)  ketorolac  (TORADOL ) 15 MG/ML injection 15 mg (15 mg Intravenous Given 07/08/24 1431)  potassium chloride  SA (KLOR-CON  M) CR tablet 40 mEq (40 mEq Oral Given 07/08/24 1431)  magnesium  sulfate IVPB 2 g 50 mL (0 g Intravenous Stopped 07/08/24 1447)  sodium chloride  0.9 % bolus 1,000 mL (0 mLs Intravenous Stopped 07/08/24 1558)    Clinical Course as of 07/08/24 1650  Sun Jul 08, 2024  1435 CT head unremarkable I have ordered portable chest x-ray, add on urine, will obtain a rectal temp.  Will have low threshold to initiate empiric antibiotics.  Patient has no nuchal rigidity. [WF]  1549 Isolated headache, tachycardic. WBC, abrupt onset. Normal workup. Needs reevaluation and possible  [CB]    Clinical Course User Index [CB] Bauer, Collin S, PA-C [WF] Suzanne Lewis, Suzanne Lewis                                 Medical Decision Making Amount and/or Complexity of Data Reviewed Labs: ordered. Radiology: ordered.  Risk OTC drugs. Prescription drug management.   This patient presents to the ED for  concern of headache, this involves a number of treatment options, and is a complaint that carries with it a moderate to high risk of complications and morbidity. A differential diagnosis was considered for the patient's symptoms which is discussed below:   Emergent considerations for headache include subarachnoid hemorrhage, meningitis, temporal arteritis, glaucoma, cerebral ischemia, carotid/vertebral dissection, intracranial tumor, Venous sinus thrombosis, carbon monoxide poisoning, acute or chronic subdural hemorrhage.  Other considerations include: Migraine, Cluster headache, Hypertension, Caffeine, alcohol, or drug withdrawal, Pseudotumor cerebri, Arteriovenous malformation, Head injury, Neurocysticercosis, Post-lumbar puncture, Preeclampsia, Tension headache, Sinusitis, Cervical arthritis, Refractive error causing strain, Dental abscess, Otitis media, Temporomandibular joint syndrome, Depression, Somatoform disorder (eg, somatization) Trigeminal neuralgia, Glossopharyngeal neuralgia.    Co morbidities: Discussed in HPI   Brief History:  Patient is a 43 year old female history of asthma obesity hyperlipidemia hypertension and OSA requiring CPAP  She presents emergency room today with complaints of generalized headache she states that is the worst headache that she has had.  She states that it is worse behind both eyes she denies any blurry vision double vision chest pain or difficulty breathing no arm or leg weakness has not had not had any facial droop or slurred speech.  She does not frequently suffer from headaches.  She denies any neck pain and is moving her head around without difficulty.  No head trauma no history of anticoagulation.    EMR reviewed including pt PMHx, past surgical history and past visits to ER.   See HPI for more details   Lab Tests:   I ordered and independently interpreted labs. Labs notable for Nonspecific leukocytosis, left shift present.  Mild  hypokalemia 3.2 lactate normal is tachycardic  Imaging Studies:  NAD. I personally reviewed all imaging studies and no acute abnormality found. I agree with radiology interpretation. Chest x-ray and CT head unremarkable   Cardiac Monitoring:  The patient was maintained on a cardiac monitor.  I personally viewed and interpreted the cardiac monitored which showed an underlying rhythm of: Sinus tachycardia EKG non-ischemic sinus tachycardia sinus   Medicines ordered:  I ordered medication including 2 L normal saline, Tylenol , magnesium , Reglan , potassium and Benadryl  for headache Reevaluation of the patient after these medicines  showed that the patient improved I have reviewed the patients home medicines and have made adjustments as needed   Critical Interventions:     Consults/Attending Physician    patient care handed off to oncoming provider.   I recommend reevaluation.  This patient certainly has some concerning vital signs however I am reassured by negative CT head for hemorrhage.  Would benefit from continued monitoring and reassessments.  Final diagnoses:  None    ED Discharge Orders     None          Suzanne Lewis, Suzanne Lewis 07/08/24 1652    Suzanne Hamp Harvard, Suzanne Lewis 07/08/24 1652    Suzanne Elsie CROME, MD 07/09/24 1513

## 2024-07-08 NOTE — ED Provider Triage Note (Signed)
 Emergency Medicine Provider Triage Evaluation Note  CYANNA NEACE , a 43 y.o. female  was evaluated in triage.  Pt complains of headache. Reports headache awoke her from rest this morning and is severe, the worst headache she has ever had. Pain is behind her eyes on both sides. Denies vision changes. Did have nausea/vomiting. Denies history of headaches. Felt well when she went to bed last night. No sick contacts, fevers/chills, cough, congestion. Reports photophobia  Review of Systems  Positive:  Negative:   Physical Exam  BP 122/74   Pulse (!) 150   Temp 99.7 F (37.6 C)   Resp 18   Ht 4' 11 (1.499 m)   Wt 136.1 kg   SpO2 92%   BMI 60.59 kg/m  Gen:   Awake, no distress   Resp:  Normal effort  MSK:   Moves extremities without difficulty  Other:  Alert and oriented, no focal neurologic deficits  Medical Decision Making  Medically screening exam initiated at 12:14 PM.  Appropriate orders placed.  Effie ONEIDA Hammonds was informed that the remainder of the evaluation will be completed by another provider, this initial triage assessment does not replace that evaluation, and the importance of remaining in the ED until their evaluation is complete.     Nora Lauraine LABOR, PA-C 07/08/24 1216

## 2024-07-08 NOTE — ED Triage Notes (Signed)
 Pt states starting this morning she has a bad headache and has vomited twice and having trouble catching her breath. Denies hx of migraines or light sensitivity.

## 2024-07-08 NOTE — ED Provider Notes (Signed)
  Physical Exam  BP 119/82 (BP Location: Right Wrist)   Pulse (!) 138   Temp 100.2 F (37.9 C) (Rectal)   Resp 20   Ht 4' 11 (1.499 m)   Wt 136.1 kg   SpO2 96%   BMI 60.59 kg/m   Physical Exam Vitals and nursing note reviewed.  Constitutional:      General: She is not in acute distress.    Appearance: She is well-developed. She is not ill-appearing.  HENT:     Head: Normocephalic.  Cardiovascular:     Rate and Rhythm: Tachycardia present.  Pulmonary:     Effort: Pulmonary effort is normal. No respiratory distress.  Musculoskeletal:     Cervical back: Normal range of motion. No rigidity.  Neurological:     Mental Status: She is alert and oriented to person, place, and time.     GCS: GCS eye subscore is 4. GCS verbal subscore is 5. GCS motor subscore is 6.     Cranial Nerves: No cranial nerve deficit.     Procedures  Procedures  ED Course / MDM   Clinical Course as of 07/08/24 1633  Sun Jul 08, 2024  1435 CT head unremarkable I have ordered portable chest x-ray, add on urine, will obtain a rectal temp.  Will have low threshold to initiate empiric antibiotics.  Patient has no nuchal rigidity. [WF]  1549 Isolated headache, tachycardic. WBC, abrupt onset. Normal workup. Needs reevaluation and possible  [CB]    Clinical Course User Index [CB] Angellina Ferdinand S, PA-C [WF] Neldon Hamp RAMAN, [place]   Medical Decision Making Amount and/or Complexity of Data Reviewed Labs: ordered. Radiology: ordered.  Risk OTC drugs. Prescription drug management.   See previous provider note.  Patient care transferred over from [facility].  At time of handoff, reassess after medications, considering CTA versus LP versus discharge home if patient symptoms have abated.  Both I and attending both saw patient and patient symptoms had completely abated, had shared decision making regarding possible LP versus CTA for further evaluation of patient symptoms and patient declined saying  that she would rather go home and monitor symptoms.  With patient asymptomatic at this time, believe this is reasonable.  Will have her continue to follow-up and establish care with PCP.  Patient vital signs have remained stable throughout the course of patient's time in the ED. Low suspicion for any other emergent pathology at this time. I believe this patient is safe to be discharged. Provided strict return to ER precautions. Patient expressed agreement and understanding of plan. All questions were answered.       Beola Terrall RAMAN, PA-C 07/08/24 1715    Tegeler, Lonni PARAS, MD 07/09/24 BEATRIS

## 2024-07-11 ENCOUNTER — Ambulatory Visit (HOSPITAL_COMMUNITY): Admission: EM | Admit: 2024-07-11 | Discharge: 2024-07-11 | Disposition: A | Discharge status: Home / Self Care

## 2024-07-11 DIAGNOSIS — B349 Viral infection, unspecified: Secondary | ICD-10-CM

## 2024-07-11 LAB — POC COVID19/FLU A&B COMBO
Covid Antigen, POC: NEGATIVE
Influenza A Antigen, POC: NEGATIVE
Influenza B Antigen, POC: NEGATIVE

## 2024-07-11 LAB — POCT RAPID STREP A (OFFICE): Rapid Strep A Screen: NEGATIVE

## 2024-07-11 MED ORDER — AZELASTINE HCL 0.1 % NA SOLN: 1.0000 | Freq: Two times a day (BID) | NASAL | 0 refills | Status: AC

## 2024-07-11 MED ORDER — PROMETHAZINE-DM 6.25-15 MG/5ML PO SYRP: 10.0000 mL | ORAL_SOLUTION | Freq: Three times a day (TID) | ORAL | 0 refills | Status: AC | PRN

## 2024-07-11 MED ORDER — PREDNISONE 20 MG PO TABS: 40.0000 mg | ORAL_TABLET | Freq: Every day | ORAL | 0 refills | Status: AC

## 2024-07-11 NOTE — Discharge Instructions (Signed)
°  1. Acute viral syndrome (Primary) - POC Covid19/Flu A&B Antigen complete in UC is negative for COVID and influenza - POC rapid strep A complete and UC is negative for strep pharyngitis - azelastine (ASTELIN) 0.1 % nasal spray; Place 1 spray into both nostrils 2 (two) times daily. Use in each nostril as directed  Dispense: 30 mL; Refill: 0 - promethazine -dextromethorphan (PROMETHAZINE -DM) 6.25-15 MG/5ML syrup; Take 10 mLs by mouth 3 (three) times daily as needed for cough.  Dispense: 240 mL; Refill: 0 - predniSONE  (DELTASONE ) 20 MG tablet; Take 2 tablets (40 mg total) by mouth daily for 5 days.  Dispense: 10 tablet; Refill: 0  -Continue to monitor symptoms for any change in severity if there is any escalation of current symptoms or development of new symptoms follow-up in ER for further evaluation and management.

## 2024-07-11 NOTE — ED Provider Notes (Signed)
 [facility]  Note:  This document was prepared using Conservation officer, historic buildings and may include unintentional dictation errors.  MRN: [id number] DOB: Sep 28, 1980  Subjective:   Suzanne Lewis is a 43 y.o. female presenting for evaluation of ongoing sore throat x 3 days.  Patient reports has been taking Tylenol  and NyQuil with minimal improvement to symptoms.  Patient was seen in the ER on Sunday and was tested for COVID and flu which was negative.  Patient denies any further treatment in the ER.  Denies any known sick contacts or exposure to strep COVID, flu.   Current Facility-Administered Medications:    diclofenac  Sodium (VOLTAREN ) 1 % topical gel 4 g, 4 g, Topical, QID PRN, Guilloud, Carolyn, MD  Current Outpatient Medications:    azelastine (ASTELIN) 0.1 % nasal spray, Place 1 spray into both nostrils 2 (two) times daily. Use in each nostril as directed, Disp: 30 mL, Rfl: 0   predniSONE  (DELTASONE ) 20 MG tablet, Take 2 tablets (40 mg total) by mouth daily for 5 days., Disp: 10 tablet, Rfl: 0   promethazine -dextromethorphan (PROMETHAZINE -DM) 6.25-15 MG/5ML syrup, Take 10 mLs by mouth 3 (three) times daily as needed for cough., Disp: 240 mL, Rfl: 0   Allergies  Allergen Reactions   Citric Acid Hives and Itching    tongue    Codeine  Swelling    Lips swelling was an orange pill.      Past Medical History:  Diagnosis Date   Allergic rhinitis    Arthritis    RLE; broke my leg as a small child; no OR (03/29/2018)   Diabetes mellitus without complication (HCC)    Hyperlipidemia    Hypertension    Iron deficiency anemia    Mild intermittent asthma    Morbid obesity (HCC)    OSA on CPAP    Right shoulder strain 06/19/2018   Tinnitus 06/14/2016   Right ear, pulsatile in nature - seen by ENT 12/22/16. DDx included idiopathic, vascular variant, aneurysm, or vascular tumor. Physical exam & hearing test were normal. MRI, MRA, and MRV ordered for further  evaluation were all normal.    Vitamin D  deficiency      Past Surgical History:  Procedure Laterality Date   CESAREAN SECTION  2004   INTRAUTERINE DEVICE INSERTION     TONSILLECTOMY AND ADENOIDECTOMY  03/29/2018   TONSILLECTOMY AND ADENOIDECTOMY N/A 03/29/2018   Procedure: TONSILLECTOMY AND ADENOIDECTOMY;  Surgeon: Karis Clunes, MD;  Location: [facility] OR;  Service: ENT;  Laterality: N/A;    Family History  Problem Relation Age of Onset   Diabetes Mother        died at age 36   Heart failure Mother    Hypertension Mother    Hypertension Brother     Social History   Tobacco Use   Smoking status: Never   Smokeless tobacco: Never  Vaping Use   Vaping status: Never Used  Substance Use Topics   Alcohol use: Never    Alcohol/week: 0.0 standard drinks of alcohol   Drug use: Never    ROS Refer to HPI for ROS details.  Objective:    Vitals: BP 120/87 (BP Location: Right Arm)   Pulse (!) 113   Temp 98.9 F (37.2 C) (Oral)   Resp 18   SpO2 94%   Physical Exam Vitals and nursing note reviewed.  Constitutional:      General: She is not in acute distress.    Appearance: She is well-developed. She is not ill-appearing  or toxic-appearing.  HENT:     Head: Normocephalic and atraumatic.     Nose: Congestion present. No rhinorrhea.     Mouth/Throat:     Mouth: Mucous membranes are moist.     Pharynx: Oropharynx is clear. Posterior oropharyngeal erythema present. No oropharyngeal exudate.  Cardiovascular:     Rate and Rhythm: Normal rate.  Pulmonary:     Effort: Pulmonary effort is normal. No respiratory distress.     Breath sounds: No stridor. No wheezing.  Musculoskeletal:     Cervical back: Normal range of motion and neck supple. Tenderness present. No rigidity.  Lymphadenopathy:     Cervical: No cervical adenopathy.  Skin:    General: Skin is warm and dry.  Neurological:     General: No focal deficit present.     Mental Status: She is alert and oriented to person, place,  and time.  Psychiatric:        Mood and Affect: Mood normal.        Behavior: Behavior normal.     Procedures  Results for orders placed or performed during the hospital encounter of 07/11/24 (from the past 24 hours)  POC rapid strep A     Status: None   Collection Time: 07/11/24  4:43 PM  Result Value Ref Range   Rapid Strep A Screen Negative Negative  POC Covid19/Flu A&B Antigen     Status: None   Collection Time: 07/11/24  5:20 PM  Result Value Ref Range   Influenza A Antigen, POC Negative Negative   Influenza B Antigen, POC Negative Negative   Covid Antigen, POC Negative Negative    Assessment and Plan :     Discharge Instructions       1. Acute viral syndrome (Primary) - POC Covid19/Flu A&B Antigen complete in UC is negative for COVID and influenza - POC rapid strep A complete and UC is negative for strep pharyngitis - azelastine (ASTELIN) 0.1 % nasal spray; Place 1 spray into both nostrils 2 (two) times daily. Use in each nostril as directed  Dispense: 30 mL; Refill: 0 - promethazine -dextromethorphan (PROMETHAZINE -DM) 6.25-15 MG/5ML syrup; Take 10 mLs by mouth 3 (three) times daily as needed for cough.  Dispense: 240 mL; Refill: 0 - predniSONE  (DELTASONE ) 20 MG tablet; Take 2 tablets (40 mg total) by mouth daily for 5 days.  Dispense: 10 tablet; Refill: 0  -Continue to monitor symptoms for any change in severity if there is any escalation of current symptoms or development of new symptoms follow-up in ER for further evaluation and management.      Eylin Pontarelli B Carron Jaggi   Sherri Levenhagen, [place] B, [place] 07/11/24 1732

## 2024-07-11 NOTE — ED Triage Notes (Signed)
 Pt coming in today for sore throat x 3 days. Has taken tylenol  @ 8 am with no relief.

## 2024-07-23 ENCOUNTER — Telehealth: Payer: Self-pay | Admitting: *Deleted

## 2024-07-23 NOTE — Telephone Encounter (Signed)
 Copied from CRM #[id number]. Topic: Clinical - Order For Equipment >> Jul 23, 2024 11:16 AM Farrel B wrote: Reason for CRM: [phone] [employer] [facility], called in regard to the pt CPAP machine and supplies. Ms Ashely states that she will be sending over a fax requesting pt information to provide the necessary mentioned above.

## 2024-08-01 ENCOUNTER — Encounter: Payer: Self-pay | Admitting: Student

## 2024-08-01 ENCOUNTER — Ambulatory Visit: Payer: Self-pay | Admitting: Student

## 2024-08-01 ENCOUNTER — Ambulatory Visit: Admitting: Student

## 2024-08-01 VITALS — BP 123/70 | HR 117 | Ht 59.0 in | Wt 316.6 lb

## 2024-08-01 DIAGNOSIS — R000 Tachycardia, unspecified: Secondary | ICD-10-CM | POA: Diagnosis not present

## 2024-08-01 DIAGNOSIS — R062 Wheezing: Secondary | ICD-10-CM

## 2024-08-01 DIAGNOSIS — J4520 Mild intermittent asthma, uncomplicated: Secondary | ICD-10-CM | POA: Diagnosis not present

## 2024-08-01 LAB — D-DIMER, QUANTITATIVE: D-Dimer, Quant: 0.47 ug{FEU}/mL (ref 0.00–0.50)

## 2024-08-01 MED ORDER — GUAIFENESIN ER 600 MG PO TB12: 600.0000 mg | ORAL_TABLET | Freq: Two times a day (BID) | ORAL | 2 refills | Status: AC

## 2024-08-01 MED ORDER — BUDESONIDE-FORMOTEROL FUMARATE 80-4.5 MCG/ACT IN AERO: 2.0000 | INHALATION_SPRAY | Freq: Two times a day (BID) | RESPIRATORY_TRACT | 3 refills | Status: DC

## 2024-08-01 MED ORDER — IPRATROPIUM-ALBUTEROL 0.5-2.5 (3) MG/3ML IN SOLN: 3.0000 mL | Freq: Once | RESPIRATORY_TRACT | Status: AC

## 2024-08-01 MED ORDER — PREDNISONE 20 MG PO TABS: 40.0000 mg | ORAL_TABLET | Freq: Every day | ORAL | 0 refills | Status: AC

## 2024-08-01 NOTE — Progress Notes (Signed)
 "  CC: UC f/u  HPI: [name] is a 43 y.o. female living with a history stated below and presents today for UC f/u. Please see problem based assessment and plan for additional details.  Discussed the use of AI scribe software for clinical note transcription with the patient, who gave verbal consent to proceed.  History of Present Illness Discussed the use of AI scribe software for clinical note transcription with the patient, who gave verbal consent to proceed.  History of Present Illness   Suzanne Lewis is a 43 year old female with asthma who presents with persistent cough and shortness of breath.  She has been experiencing a persistent cough that worsens at night, leading to continuous coughing. The cough is mostly dry, with occasional sputum production. She was initially seen at urgent care on 12/10 for a sore throat and cough, negative RVP and strep A,  and she was prescribed a nasal spray, cough medicine, and prednisone , which provided some relief. Prior to that she was seen in the ED on 12/7 for headache that resolved.   She experiences shortness of breath, particularly at night and during physical activity. Her asthma typically exacerbates when she has a cold. She has been using albuterol  every 4 to 6 hours, which provides some relief but is not as effective as usual. She recalls a previous asthma flare-up years ago with different symptoms.  No fever, recent illness in her family, or exposure to sick contacts. She also denies any recent travel, leg pain, or history of blood clots. She reports a history of high heart rate, which was noted during a previous emergency room visit on July 08, 2024, where she was evaluated for headache and discharged. Was negative for COVID-19 and flu in ED and UC.   She reports chest tightness without pain and side pain from coughing. Her sore throat has improved, but the cough persists. She denies any recent changes in medication aside from the  treatments provided at urgent care.     Past Medical History:  Diagnosis Date   Allergic rhinitis    Arthritis    RLE; broke my leg as a small child; no OR (03/29/2018)   Diabetes mellitus without complication (HCC)    Hyperlipidemia    Hypertension    Iron deficiency anemia    Mild intermittent asthma    Morbid obesity (HCC)    OSA on CPAP    Right shoulder strain 06/19/2018   Tinnitus 06/14/2016   Right ear, pulsatile in nature - seen by ENT 12/22/16. DDx included idiopathic, vascular variant, aneurysm, or vascular tumor. Physical exam & hearing test were normal. MRI, MRA, and MRV ordered for further evaluation were all normal.    Vitamin D  deficiency     Medications Ordered Prior to Encounter[1]  Family History  Problem Relation Age of Onset   Diabetes Mother        died at age 13   Heart failure Mother    Hypertension Mother    Hypertension Brother     Social History   Socioeconomic History   Marital status: Single    Spouse name: Not on file   Number of children: Not on file   Years of education: Not on file   Highest education level: Not on file  Occupational History   Not on file  Tobacco Use   Smoking status: Never   Smokeless tobacco: Never  Vaping Use   Vaping status: Never Used  Substance and Sexual  Activity   Alcohol use: Never    Alcohol/week: 0.0 standard drinks of alcohol   Drug use: Never   Sexual activity: Yes    Birth control/protection: I.U.D.    Comment: currently has IUD  Other Topics Concern   Not on file  Social History Narrative   Not on file   Social Drivers of Health   Tobacco Use: Low Risk (07/08/2024)   Patient History    Smoking Tobacco Use: Never    Smokeless Tobacco Use: Never    Passive Exposure: Not on file  Financial Resource Strain: Low Risk (11/02/2023)   Overall Financial Resource Strain (CARDIA)    Difficulty of Paying Living Expenses: Not hard at all  Food Insecurity: No Food Insecurity (11/02/2023)   Hunger  Vital Sign    Worried About Running Out of Food in the Last Year: Never true    Ran Out of Food in the Last Year: Never true  Transportation Needs: No Transportation Needs (11/02/2023)   PRAPARE - Administrator, Civil Service (Medical): No    Lack of Transportation (Non-Medical): No  Physical Activity: Insufficiently Active (11/02/2023)   Exercise Vital Sign    Days of Exercise per Week: 2 days    Minutes of Exercise per Session: 30 min  Stress: No Stress Concern Present (11/02/2023)   [employer] of Occupational Health - Occupational Stress Questionnaire    Feeling of Stress : Not at all  Social Connections: Moderately Integrated (11/02/2023)   Social Connection and Isolation Panel    Frequency of Communication with Friends and Family: More than three times a week    Frequency of Social Gatherings with Friends and Family: More than three times a week    Attends Religious Services: 1 to 4 times per year    Active Member of [employer] or Organizations: No    Attends Banker Meetings: Never    Marital Status: Living with partner  Intimate Partner Violence: Not At Risk (11/02/2023)   Humiliation, Afraid, Rape, and Kick questionnaire    Fear of Current or Ex-Partner: No    Emotionally Abused: No    Physically Abused: No    Sexually Abused: No  Depression (PHQ2-9): Low Risk (08/01/2024)   Depression (PHQ2-9)    PHQ-2 Score: 0  Alcohol Screen: Low Risk (11/02/2023)   Alcohol Screen    Last Alcohol Screening Score (AUDIT): 0  Housing: Low Risk (11/02/2023)   Housing Stability Vital Sign    Unable to Pay for Housing in the Last Year: No    Number of Times Moved in the Last Year: 0    Homeless in the Last Year: No  Utilities: Not At Risk (11/02/2023)   AHC Utilities    Threatened with loss of utilities: No  Health Literacy: Adequate Health Literacy (11/02/2023)   B1300 Health Literacy    Frequency of need for help with medical instructions: Never    Review of  Systems: ROS negative except for what is noted on the assessment and plan.  Vitals:   08/01/24 0828 08/01/24 0900 08/01/24 0924  BP: 123/70    Pulse: (!) 120 (!) 106 (!) 117  TempSrc: Oral    SpO2: 96%    Weight: (!) 316 lb 9.6 oz (143.6 kg)    Height: 4' 11 (1.499 m)     Physical Exam: Constitutional: alert, sitting up in chair comfortably on phone, in no acute distress Cardiovascular: tachycardia, regular rhythm  Pulmonary/Chest: normal work of breathing on room  air, mild wheezing bilaterally, no crackles, able to speak in full sentences Neurological: alert & oriented x 3  Post Duoneb treatment w/ improved breath sounds and decreased wheezing  Assessment & Plan:   Assessment & Plan Wheezing Mild intermittent asthma without complication Appears to have some URI. Negative Covid and flu x 2. Some symptoms improved. Lingering cough and advised patient that cough can take weeks to resolve.   Received duoneb treatment w/ improvement and patient expressing she feels better.   Plan -Prednisone  40 mg x 5 days  -Mucinex  BID -Continue supportive care -Try AIR therapy (Symbicort) for asthma  -Return precautions discussed w/ f/u in 2 weeks if no improvement or worsening (otherwise if resolved can plan 1 month f/u)  Orders:   ipratropium-albuterol  (DUONEB) 0.5-2.5 (3) MG/3ML nebulizer solution 3 mL   predniSONE  (DELTASONE ) 20 MG tablet; Take 2 tablets (40 mg total) by mouth daily with breakfast for 5 days.   guaiFENesin  (MUCINEX ) 600 MG 12 hr tablet; Take 1 tablet (600 mg total) by mouth 2 (two) times daily.   budesonide-formoterol (SYMBICORT) 80-4.5 MCG/ACT inhaler; Inhale 2 puffs into the lungs 2 (two) times daily.  Tachycardia Tachycardia up to 120 today and noted during UC and ED visits. After rest during OV, HR down to 106. Used albuterol  inhaler this AM before OV. No hx of persistent tachycardia. Has been taking albuterol  inhaler more  in past week up to Q4-6H. No provoking  factors for VTE or physical exam findings.   Plan -Check d-dimer today -Follow up HR at next OV if persisting obtain EKG  ADDENDUM: D-dimer WNL. Less suspicious for VTE driving tachycardia at this time. Did discuss return precautions.   Orders:   D-dimer, quantitative (not at [facility])     Return in about 2 weeks (around 08/15/2024) for wheezing, cough and shortness of breath (if improved/resolved then plan for 1 month follow up).   Patient discussed with Dr. Machen  Breland Elders, D.O. [facility], PGY-3 Clinic Phone: [phone] Date 08/01/2024 Time 4:42 PM     [1]  Current Outpatient Medications on File Prior to Visit  Medication Sig Dispense Refill   azelastine  (ASTELIN ) 0.1 % nasal spray Place 1 spray into both nostrils 2 (two) times daily. Use in each nostril as directed 30 mL 0   promethazine -dextromethorphan (PROMETHAZINE -DM) 6.25-15 MG/5ML syrup Take 10 mLs by mouth 3 (three) times daily as needed for cough. 240 mL 0   Current Facility-Administered Medications on File Prior to Visit  Medication Dose Route Frequency Provider Last Rate Last Admin   diclofenac  Sodium (VOLTAREN ) 1 % topical gel 4 g  4 g Topical QID PRN Forest Coy, MD       "

## 2024-08-01 NOTE — Patient Instructions (Signed)
 Thank you, Ms.Effie ONEIDA Hammonds for allowing us  to provide your care today. Today we discussed:  -Will treat for asthma exacerbation: 5 days of prednisone , Mucinex  twice a day and breathing treatment today -Blood work today, I will call with results.   Follow up: 2 weeks if no improvement, if resolved then plan for 1 month follow up    Should you have any questions or concerns please call the internal medicine clinic at [phone].    Catori Panozzo, D.O. [facility]

## 2024-08-01 NOTE — Assessment & Plan Note (Signed)
 Appears to have some URI. Negative Covid and flu x 2. Some symptoms improved. Lingering cough and advised patient that cough can take weeks to resolve.   Received duoneb treatment w/ improvement and patient expressing she feels better.   Plan -Prednisone  40 mg x 5 days  -Mucinex  BID -Continue supportive care -Try AIR therapy (Symbicort) for asthma  -Return precautions discussed w/ f/u in 2 weeks if no improvement or worsening (otherwise if resolved can plan 1 month f/u)  Orders:   ipratropium-albuterol  (DUONEB) 0.5-2.5 (3) MG/3ML nebulizer solution 3 mL   predniSONE  (DELTASONE ) 20 MG tablet; Take 2 tablets (40 mg total) by mouth daily with breakfast for 5 days.   guaiFENesin  (MUCINEX ) 600 MG 12 hr tablet; Take 1 tablet (600 mg total) by mouth 2 (two) times daily.   budesonide-formoterol (SYMBICORT) 80-4.5 MCG/ACT inhaler; Inhale 2 puffs into the lungs 2 (two) times daily.

## 2024-08-05 ENCOUNTER — Other Ambulatory Visit: Payer: Self-pay | Admitting: Internal Medicine

## 2024-08-05 ENCOUNTER — Other Ambulatory Visit: Payer: Self-pay | Admitting: Student

## 2024-08-05 DIAGNOSIS — E119 Type 2 diabetes mellitus without complications: Secondary | ICD-10-CM

## 2024-08-06 ENCOUNTER — Other Ambulatory Visit: Payer: Self-pay

## 2024-08-06 MED ORDER — OZEMPIC (2 MG/DOSE) 8 MG/3ML ~~LOC~~ SOPN: PEN_INJECTOR | SUBCUTANEOUS | 6 refills | Status: DC

## 2024-08-06 NOTE — Telephone Encounter (Signed)
 Medication discontinued 07/11/24

## 2024-08-06 NOTE — Progress Notes (Signed)
 Internal Medicine Clinic Attending  Case discussed with the resident at the time of the visit.  We reviewed the resident's history and exam and pertinent patient test results.  I agree with the assessment, diagnosis, and plan of care documented in the resident's note.

## 2024-08-07 ENCOUNTER — Other Ambulatory Visit: Payer: Self-pay

## 2024-08-07 DIAGNOSIS — R062 Wheezing: Secondary | ICD-10-CM

## 2024-08-07 DIAGNOSIS — J4520 Mild intermittent asthma, uncomplicated: Secondary | ICD-10-CM

## 2024-08-07 MED ORDER — BUDESONIDE-FORMOTEROL FUMARATE 80-4.5 MCG/ACT IN AERO: 2.0000 | INHALATION_SPRAY | Freq: Two times a day (BID) | RESPIRATORY_TRACT | 3 refills | Status: AC

## 2024-08-07 NOTE — Telephone Encounter (Signed)
 Medication sent to pharmacy

## 2024-08-10 ENCOUNTER — Other Ambulatory Visit: Payer: Self-pay | Admitting: Student

## 2024-08-13 ENCOUNTER — Ambulatory Visit: Admitting: Student

## 2024-08-13 NOTE — Telephone Encounter (Signed)
 Medication discontinued 07/11/24

## 2024-08-15 ENCOUNTER — Other Ambulatory Visit: Payer: Self-pay

## 2024-08-15 ENCOUNTER — Telehealth: Payer: Self-pay | Admitting: *Deleted

## 2024-08-15 MED ORDER — ROSUVASTATIN CALCIUM 20 MG PO TABS: 20.0000 mg | ORAL_TABLET | Freq: Every day | ORAL | 3 refills | Status: AC

## 2024-08-15 NOTE — Telephone Encounter (Signed)
 Received refill request from pt's pharmacy for rosuvastatin  20mg  one tab daily-last filled 05/28/24  Medication is no longer on pt's medication list.  Removed during 07/11/24 Urgent Care visit.  [facility] does not have security to add back to pt's profile Will send request to pcp for review.

## 2024-08-20 ENCOUNTER — Ambulatory Visit

## 2024-08-20 ENCOUNTER — Other Ambulatory Visit: Payer: Self-pay | Admitting: Student

## 2024-08-20 VITALS — BP 118/83 | HR 81 | Temp 97.8°F | Ht 59.0 in

## 2024-08-20 DIAGNOSIS — Z7985 Long-term (current) use of injectable non-insulin antidiabetic drugs: Secondary | ICD-10-CM

## 2024-08-20 DIAGNOSIS — Z794 Long term (current) use of insulin: Secondary | ICD-10-CM | POA: Diagnosis not present

## 2024-08-20 DIAGNOSIS — J4520 Mild intermittent asthma, uncomplicated: Secondary | ICD-10-CM | POA: Diagnosis not present

## 2024-08-20 DIAGNOSIS — Z0000 Encounter for general adult medical examination without abnormal findings: Secondary | ICD-10-CM

## 2024-08-20 DIAGNOSIS — E1129 Type 2 diabetes mellitus with other diabetic kidney complication: Secondary | ICD-10-CM

## 2024-08-20 DIAGNOSIS — R809 Proteinuria, unspecified: Secondary | ICD-10-CM

## 2024-08-20 DIAGNOSIS — E119 Type 2 diabetes mellitus without complications: Secondary | ICD-10-CM

## 2024-08-20 LAB — POCT GLYCOSYLATED HEMOGLOBIN (HGB A1C): HbA1c, POC (controlled diabetic range): 5.6 % (ref 0.0–7.0)

## 2024-08-20 LAB — GLUCOSE, CAPILLARY: Glucose-Capillary: 86 mg/dL (ref 70–99)

## 2024-08-20 MED ORDER — TIRZEPATIDE 5 MG/0.5ML ~~LOC~~ SOAJ: 5.0000 mg | SUBCUTANEOUS | 1 refills | Status: AC

## 2024-08-20 MED ORDER — TIRZEPATIDE 2.5 MG/0.5ML ~~LOC~~ SOAJ: 2.5000 mg | SUBCUTANEOUS | 1 refills | Status: AC

## 2024-08-20 MED ORDER — TIRZEPATIDE-WEIGHT MANAGEMENT 7.5 MG/0.5ML ~~LOC~~ SOLN: 7.5000 mg | SUBCUTANEOUS | 2 refills | Status: AC

## 2024-08-20 NOTE — Patient Instructions (Addendum)
 Today we discussed the following medical conditions and plan:   For your diabetes, your A1c looks great!! Let us  try switching to the tirzepatide ( mounjaro ) to see if this can help with weight loss more. You can start at the 2.5 dose for about 3 weeks and then go up to the 5 mg dose for 3 weeks and then go up to the 7.5 mg dose. We can see you back in about 2 months to go over these medication changes and see how you are doing   We look forward to seeing you next time. Please call our clinic at [phone] if you have any questions or concerns. The best time to call is Monday-Friday from 9am-4pm, but there is someone available 24/7. If you need medication refills, please notify your pharmacy one week in advance and they will send us  a request.   Thank you for trusting me with your care. Wishing you the best!   Joyell Emami D'Mello, DO  [facility]

## 2024-08-20 NOTE — Progress Notes (Unsigned)
 "  Established Patient Office Visit  Subjective   Patient ID: Suzanne Lewis, female    DOB: [birth year]  Age: 44 y.o. MRN: [id number]  Chief Complaint  Patient presents with   Follow-up    HPI Patient is a 44 year old female with PMH of type 2 diabetes with microalbuminuria, class 3 obesity, HTN, HLD, OSA that presents for follow-up of chronic conditions.  See problem-based plan and assessment for more details   ROS Problem-based plan assessment for details   Objective:     BP 118/83 (BP Location: Left Wrist, Patient Position: Sitting, Cuff Size: Normal)   Pulse 81   Temp 97.8 F (36.6 C) (Oral)   Ht 4' 11 (1.499 m)   SpO2 96%   BMI 63.95 kg/m  Wt Readings from Last 3 Encounters:  08/01/24 (!) 316 lb 9.6 oz (143.6 kg)  07/08/24 300 lb (136.1 kg)  06/18/24 (!) 318 lb 3.2 oz (144.3 kg)      Physical Exam Constitutional:      General: Suzanne Lewis is not in acute distress.    Appearance: Suzanne Lewis is obese.  Cardiovascular:     Rate and Rhythm: Normal rate.     Heart sounds: No murmur heard. Pulmonary:     Effort: Pulmonary effort is normal. No respiratory distress.     Breath sounds: No wheezing.  Neurological:     Mental Status: Suzanne Lewis is alert.      Results for orders placed or performed in visit on 08/20/24  Glucose, capillary  Result Value Ref Range   Glucose-Capillary 86 70 - 99 mg/dL  POC Hbg J8R  Result Value Ref Range   Hemoglobin A1C     HbA1c POC (<> result, manual entry)     HbA1c, POC (prediabetic range)     HbA1c, POC (controlled diabetic range) 5.6 0.0 - 7.0 %      The 10-year ASCVD risk score (Arnett DK, et al., 2019) is: 1.3%    Assessment & Plan:   Problem List Items Addressed This Visit       Respiratory   Mild intermittent asthma   Patient states that her breathing is doing better now.  Well-controlled with Symbicort   Plan: Continue Symbicort  2 puffs twice a day        Endocrine   Type 2 diabetes mellitus with microalbuminuria, without  long-term current use of insulin  (HCC) (Chronic)   A1c is 5.6 today.  Stable from last visit.  Patient is currently on semaglutide  2 mg and has been tolerating this dose well.  Did discuss that Suzanne Lewis would like to have some more weight loss.  Did recommend switching to Mounjaro  and titrating up.  Patient was agreeable to this plan.   Plan: Follow microalbumin creatinine ratio Stop semaglutide  Start Mounjaro  2.5 mg weekly for 3 weeks, 5 mg weekly for 3 weeks and 7.5 mg afterwards      Relevant Medications   tirzepatide  (MOUNJARO ) 2.5 MG/0.5ML Pen   tirzepatide  (MOUNJARO ) 5 MG/0.5ML Pen     Other   Healthcare maintenance   Patient has never had mammogram before, no family history of breast cancer.   Plan: Emphasized the importance of self breast exams monthly Ordered mammogram      Relevant Orders   MM 3D SCREENING MAMMOGRAM BILATERAL BREAST   Other Visit Diagnoses       Type 2 diabetes mellitus without complication, with long-term current use of insulin  (HCC)    -  Primary   Relevant Medications  tirzepatide  (MOUNJARO ) 2.5 MG/0.5ML Pen   tirzepatide  (MOUNJARO ) 5 MG/0.5ML Pen   Other Relevant Orders   POC Hbg A1C (Completed)   Microalbumin / Creatinine Urine Ratio      Return in about 3 months (around 11/18/2024) for change to moujaro, weight loss .    Suzanne Alkhatib D'Mello, DO Patient discussed with Dr. Francesco "

## 2024-08-20 NOTE — Telephone Encounter (Signed)
 Medication discontinued 07/11/24

## 2024-08-21 ENCOUNTER — Ambulatory Visit: Payer: Self-pay

## 2024-08-21 LAB — MICROALBUMIN / CREATININE URINE RATIO
Creatinine, Urine: 163.3 mg/dL
Microalb/Creat Ratio: 10 mg/g{creat} (ref 0–29)
Microalbumin, Urine: 16 ug/mL

## 2024-08-21 NOTE — Assessment & Plan Note (Signed)
 Patient states that her breathing is doing better now.  Well-controlled with Symbicort   Plan: Continue Symbicort  2 puffs twice a day

## 2024-08-21 NOTE — Assessment & Plan Note (Signed)
 A1c is 5.6 today.  Stable from last visit.  Patient is currently on semaglutide  2 mg and has been tolerating this dose well.  Did discuss that she would like to have some more weight loss.  Did recommend switching to Mounjaro  and titrating up.  Patient was agreeable to this plan.   Plan: Follow microalbumin creatinine ratio Stop semaglutide  Start Mounjaro  2.5 mg weekly for 3 weeks, 5 mg weekly for 3 weeks and 7.5 mg afterwards

## 2024-08-21 NOTE — Assessment & Plan Note (Signed)
 Patient has never had mammogram before, no family history of breast cancer.   Plan: Emphasized the importance of self breast exams monthly Ordered mammogram

## 2024-08-22 NOTE — Progress Notes (Signed)
 Internal Medicine Clinic Attending  Case discussed with the resident at the time of the visit.  We reviewed the resident's history and exam and pertinent patient test results.  I agree with the assessment, diagnosis, and plan of care documented in the resident's note.

## 2024-09-05 ENCOUNTER — Ambulatory Visit

## 2024-09-12 ENCOUNTER — Ambulatory Visit

## 2024-11-07 ENCOUNTER — Ambulatory Visit
